# Patient Record
Sex: Male | Born: 1955 | Race: White | Hispanic: No | Marital: Married | Smoking: Former smoker
Health system: Southern US, Community
[De-identification: ages and names within clinical notes are randomized; demographics above are authoritative.]

## PROBLEM LIST (undated history)

## (undated) DIAGNOSIS — K219 Gastro-esophageal reflux disease without esophagitis: Secondary | ICD-10-CM

## (undated) DIAGNOSIS — L4050 Arthropathic psoriasis, unspecified: Secondary | ICD-10-CM

## (undated) DIAGNOSIS — M1990 Unspecified osteoarthritis, unspecified site: Secondary | ICD-10-CM

## (undated) DIAGNOSIS — F1920 Other psychoactive substance dependence, uncomplicated: Secondary | ICD-10-CM

## (undated) DIAGNOSIS — M169 Osteoarthritis of hip, unspecified: Secondary | ICD-10-CM

## (undated) DIAGNOSIS — J302 Other seasonal allergic rhinitis: Secondary | ICD-10-CM

## (undated) HISTORY — PX: TOTAL HIP ARTHROPLASTY: SHX124

## (undated) HISTORY — PX: ORIF TIBIA FRACTURE: SHX5416

## (undated) HISTORY — PX: KNEE SURGERY: SHX244

---

## 1998-08-19 ENCOUNTER — Encounter: Admission: RE | Admit: 1998-08-19 | Discharge: 1998-10-10 | Payer: Self-pay | Admitting: Orthopaedic Surgery

## 1999-02-24 HISTORY — PX: TOTAL HIP ARTHROPLASTY: SHX124

## 1999-08-07 ENCOUNTER — Encounter: Payer: Self-pay | Admitting: Orthopaedic Surgery

## 1999-08-12 ENCOUNTER — Inpatient Hospital Stay (HOSPITAL_COMMUNITY): Admission: RE | Admit: 1999-08-12 | Discharge: 1999-08-17 | Payer: Self-pay | Admitting: Orthopaedic Surgery

## 1999-08-12 ENCOUNTER — Encounter: Payer: Self-pay | Admitting: Orthopaedic Surgery

## 2000-08-11 ENCOUNTER — Encounter: Payer: Self-pay | Admitting: Orthopaedic Surgery

## 2000-08-17 ENCOUNTER — Inpatient Hospital Stay (HOSPITAL_COMMUNITY): Admission: AD | Admit: 2000-08-17 | Discharge: 2000-08-21 | Payer: Self-pay | Admitting: Orthopaedic Surgery

## 2000-09-21 ENCOUNTER — Encounter: Admission: RE | Admit: 2000-09-21 | Discharge: 2000-12-20 | Payer: Self-pay | Admitting: Orthopaedic Surgery

## 2000-12-21 ENCOUNTER — Encounter: Admission: RE | Admit: 2000-12-21 | Discharge: 2001-03-21 | Payer: Self-pay | Admitting: Orthopaedic Surgery

## 2001-03-22 ENCOUNTER — Encounter: Admission: RE | Admit: 2001-03-22 | Discharge: 2001-05-30 | Payer: Self-pay | Admitting: Orthopaedic Surgery

## 2002-02-23 ENCOUNTER — Emergency Department (HOSPITAL_COMMUNITY): Admission: EM | Admit: 2002-02-23 | Discharge: 2002-02-23 | Payer: Self-pay | Admitting: Emergency Medicine

## 2002-08-22 ENCOUNTER — Encounter: Admission: RE | Admit: 2002-08-22 | Discharge: 2002-08-22 | Payer: Self-pay | Admitting: Rheumatology

## 2002-08-22 ENCOUNTER — Encounter: Payer: Self-pay | Admitting: Rheumatology

## 2002-09-01 ENCOUNTER — Ambulatory Visit (HOSPITAL_BASED_OUTPATIENT_CLINIC_OR_DEPARTMENT_OTHER): Admission: RE | Admit: 2002-09-01 | Discharge: 2002-09-01 | Payer: Self-pay | Admitting: General Surgery

## 2006-02-23 HISTORY — PX: ORIF TIBIA FRACTURE: SHX5416

## 2006-12-10 ENCOUNTER — Inpatient Hospital Stay (HOSPITAL_COMMUNITY): Admission: EM | Admit: 2006-12-10 | Discharge: 2006-12-25 | Payer: Self-pay | Admitting: Emergency Medicine

## 2006-12-14 ENCOUNTER — Ambulatory Visit: Payer: Self-pay | Admitting: Vascular Surgery

## 2006-12-15 ENCOUNTER — Encounter (INDEPENDENT_AMBULATORY_CARE_PROVIDER_SITE_OTHER): Payer: Self-pay | Admitting: Orthopedic Surgery

## 2010-07-08 NOTE — Op Note (Signed)
NAME:  Roy [name], Roy [name] NO.:  [id number]   MEDICAL RECORD NO.:  [id number]          PATIENT TYPE:  INP   LOCATION:  5040                         FACILITY:  [facility]   PHYSICIAN:  Doralee Albino. Carola Frost, M.D. DATE OF BIRTH:  1955/08/17   DATE OF PROCEDURE:  12/13/2006  DATE OF DISCHARGE:                               OPERATIVE REPORT   PREOPERATIVE DIAGNOSIS:  Left bicondylar tibial plateau fracture  Schatzker VI.   POSTOPERATIVE DIAGNOSIS:  Left bicondylar tibial plateau fracture  Schatzker VI.   PROCEDURES:  1. Manipulation and reduction of left bicondylar tibial plateau and      eminence fractures.  2. Application of spanning external fixator.   SURGEON:  Doralee Albino. Carola Frost, M.D.   ASSISTANT:  Mearl Latin, [facility].   ANESTHESIA:  General.   SPECIMENS:  None.   ESTIMATED BLOOD LOSS:  Minimal.   COMPLICATIONS:  None.   DRAINS:  None.   DISPOSITION:  To PACU.   CONDITION:  Stable.   BRIEF SUMMARY OF INDICATION FOR PROCEDURE:  Roy [name] is a 55-year-  old male who sustained a low velocity but nonetheless high-energy  pattern left bicondylar tibial plateau fracture.  We discussed  preoperatively the risks and benefits of spanning external fixation as  well as the indication for same given the significant soft tissue  swelling and fracture blisters.  He did not have any preoperative  paresthesias.  He understood that his risks include infection, nerve  injury, vessel injury, need for further surgery, pin tract infection and  others.   DESCRIPTION OF PROCEDURE:  Roy [name] was taken to the operating room  where general anesthesia was induced.  Left lower extremity was prepped  in the usual sterile fashion.  A large clamp and two pins was placed in  the distal tibia and in the mid shaft of the femur to avoid the synovial  reflection of the knee joint.  These were checked for appropriate length  on lateral fluoro images.  We then manipulated the fracture by  pulling  traction just in the alignment and applying medial directed force to the  lateral tibial plateau.  We were unable to get it completely seated  under the lateral femoral condyle but did significantly improve his  position.  Sterile gently compressive dressing was applied around the  pins and then Ace wraps from foot to thigh after first putting Adaptic  and Kerlix over the fracture blisters.  The patient was awakened from  anesthesia and transported to PACU in stable condition.   Of note, we did identify some areas of irritation along the buttock  crease at the proximal end as well as some irritation of the buttocks in  the pressure areas bilaterally.  We contacted wound service  intraoperatively who stated they would see the patient in the a.m.   PROGNOSIS:  Roy [name] will need to return to the OR as soft tissues  allow for definitive internal fixation of his fracture.  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  What ever  fixation is placed, we need to have sufficient stability to allow for  immediate range of motion.  The immediate range of motion of course will  increase his risk of bleeding complications including superficial and  deep infection.  We will obtain a DVT study now and he will begin  Lovenox for DVT prophylaxis and also will ask the occupational therapist  to apply a foot plate to keep the patient out of equinus.  I have  discussed these fractures with the patient and his wife.  I also  discussed the buttock region, which again will be seen by the wound care  nurses and he will immediately be started on side-to-side positioning  every 2 hours to relieve this pressure area.      Doralee Albino. Carola Frost, M.D.  Electronically Signed     MHH/MEDQ  D:  12/13/2006  T:  12/14/2006  Job:   [id number]

## 2010-07-08 NOTE — Op Note (Signed)
NAME:  Roy [name], Roy [name] NO.:  [id number]   MEDICAL RECORD NO.:  [id number]          PATIENT TYPE:  INP   LOCATION:  5040                         FACILITY:  [facility]   PHYSICIAN:  Doralee Albino. Carola Frost, M.D. DATE OF BIRTH:  May 22, 1955   DATE OF PROCEDURE:  12/21/2006  DATE OF DISCHARGE:                               OPERATIVE REPORT   PREOPERATIVE DIAGNOSES:  1. Left bicondylar tibial plateau fracture.  2. Tibial eminence fracture.  3. Infected proximal femoral pin site.  4. Retained external fixator left lower extremity.   POSTOPERATIVE DIAGNOSES:  1. Left bicondylar tibial plateau fracture.  2. Tibial eminence fracture.  3. Infected proximal femoral pin site.  4. Retained external fixator left lower extremity.  5. Lateral meniscus tear.   PROCEDURES:  1. Removal of external fixator under anesthesia.  2. Debridement and curettage of left femur.  3. ORIF of bicondylar tibial plateau fracture.  4. Open treatment of tibial eminence fracture.  5. Repair of lateral mensicus through an arthrotomy.   SURGEON:  Myrene Galas, M.D.   ASSISTANT:  Mearl Latin, [place]   ANESTHESIA:  General, Dr. Gelene Mink.   COMPLICATIONS:  None.   ESTIMATED BLOOD LOSS:  300 mL.   DRAINS:  One.   DISPOSITION:  To PACU.   CONDITION:  Stable.   SPECIMENS:  Two anaerobic and aerobic cultures sent from the  subcutaneous pocket off the left quad pin site.   BRIEF SUMMARY AND INDICATIONS FOR PROCEDURE:  [name] is a 55-  year-old male with psoriatic arthritis who takes Enbrel and methotrexate  who sustained a severe intra-articular bicondylar tibial plateau  fracture that required spanning external fixation and reduction.  He  subsequently was able to be taken to the OR for definitive  reconstruction given the appearance of the soft tissues about his knee.  We did changes his dressing 2 days preoperatively and at that time his  pin site was noted to be stable proximally and  distally.  At that time  we discussed with the patient risks and benefits of surgery including  the possibility of infection, nerve injury, vessel injuries, skin and  wound problems, particularly given his immunosuppressants, decreased  range of motion, arthritis, need for further surgery including total  knee arthroplasty as well as other systemic complications.  After full  discussion, he wished to proceed.   BRIEF DESCRIPTION OF PROCEDURE:  Mr. Damron was given preoperative  antibiotics.  After removal of his dressings it was readily apparent  that he did in fact have an obvious pin site infection of his most  proximal femoral pin.  The fixator was removed.  We did not encounter  any frank purulence, but there was at least 3 cm of surrounding  induration, erythema, and prominent swelling.  After a thorough  scrubbing, we then performed a standard prep and drape.  We used  curettes to debride this area down into the femur and also into a  subcutaneous pocket.  We did obtain cultures which were sent for stat  aerobic and anaerobic evaluation and would eventually produce positive  results for  Gram-positive cocci.  These pin sites were isolated from the  field, covered securely and then Ioban placed.  After a new drape we  then proceeded with medial approach to the proximal tibia, we identified  the saphenous nerve branch as well as the pes tendons and placed a  Penrose around them.  These were retracted while we continued our  dissection posteriorly.  We were able to obtain entry into the fracture  site, placed 2 K-wires proximally in the subchondral bone and used  distraction, but we were unable to completely reduce it.  We then  contoured a 3/5 recon plate, securing it proximally and then placing a  screw distally outside of the plate that we had placed a lamina spreader  against and then used this force to properly elevate and reduce the  medial fragment.  After we fixed this block at  the appropriate length,  we placed a single screw into the shaft.  Next, we performed a classic  AO incision and carried down to the retinaculum which was incised and  elevated.  We then incised the coronoid ligament, passing Prolene  sutures into the meniscus and retracting it off the floor of the tibial  plateau.  There was a tear in the anterior horn at the red white  junction essentially resulting in separation of the meniscus at the  capsule.  This was a longitudinal tear from about 12 o'clock to near 9  o'clock.  This was repaired with Prolene suture using vertical mattress  technique.  There was also another small tear on the inside of the  lateral meniscus and this was debrided sharply with the knife, but did  not involve much meniscus, and we were able to preserve the vast  majority of it without question, and again after repair it had a stable  anterior edge as well.   We then elevated the depressed articular segments anteriorly and  posteriorly, laterally and medially, and then brought over the lateral  plateau and used the [employer] clamps to obtain compression.  We then  picked the appropriate place for the lateral plate, secured it into  position, and compressed across it with standard screws and followed by  locked screws, later exchanging the standard screw as well.  We then  placed fixation into the shaft metaphysis including the kick stand  screws up into the medial plateau.  We irrigated the wounds thoroughly,  checked our alignment and hardware position carefully, and then I used a  drill to create a hole into the metaphyseal defect which was filled with  10 mL of Norian cement.  We also placed graft obtained from the  metaphysis on initial evacuation of the hematoma in along the  posterolateral fragment at the metadiaphysis.  We did place a deep drain  in the anterior compartment prior to closure and performed a standard  layered closure using 0 Vicryl, 2-0 Vicryl  and 3-0 nylon for the skin,  given the patient's again fragile soft tissues.  Sterile gently  compressive dressing was applied.  The patient was awakened from  anesthesia and transported to the PACU in stable condition.   PROGNOSIS:  Mr. Marcott will be non-weightbearing, he will be on Lovenox  for DVT prophylaxis initially, and we will discuss possible Coumadin  long-term.  We did give him an additional gram of vancomycin when the  cultures returned positive, and he will be on broad-spectrum coverage  with vanc until the definitive organism is isolated.  This is remote  from his fixation and certainly poses a risk but we are hopeful will  remain controlled with speedy resolution and until such time as he can  safely be converted to oral medications.  We anticipate slow resumption  of his immunosuppressants at least 2 weeks after surgery so his wounds  have adequate time to heal.  We did place nylon sutures to allow for  longer then usual wait time prior to removal of that also.  He will have  unrestricted range of motion of the knee both actively and passively.  Again because of his immunosuppressant he remains at elevated  risk for perioperative complications, most concerning of which is  infection, again increased by his pin tract infection as well.  Given  his anklylosis of the right elbow and contracture of the right knee, he  is at extremely high risk for contracture, as well.      Doralee Albino. Carola Frost, M.D.  Electronically Signed     MHH/MEDQ  D:  12/21/2006  T:  12/22/2006  Job:  [id number]

## 2010-07-08 NOTE — Discharge Summary (Signed)
NAME:  Roy [name], Roy [name] NO.:  [id number]   MEDICAL RECORD NO.:  [id number]          PATIENT TYPE:  INP   LOCATION:  5040                         FACILITY:  [facility]   PHYSICIAN:  Mearl Latin, PA       DATE OF BIRTH:  1955/07/07   DATE OF ADMISSION:  12/10/2006  DATE OF DISCHARGE:                               DISCHARGE SUMMARY   DISCHARGE DIAGNOSES:  1. Left bicondylar tibial plateau fracture.  2. Tibial eminence fracture.  3. Unprotected proximal femoral pin site.   ADDITIONAL DIAGNOSES:  1. Psoriatic arthritis.  2. Status post right total knee arthroplasty.  3. Status post right total hip arthroplasty.  4. Status post left elbow fusion.   PROCEDURES PERFORMED:  1. Removal of external fixator under anesthesia.  2. Debridement and curettage of left femur.  3. Open reduction and internal fixation of bicondylar tibial plateau      fracture.  4. Open treatment of tibial eminence fracture.   BRIEF HISTORY AND HOSPITAL COURSE:  Roy [name] is a 55 year old male  with history of psoriatic arthritis for 20 years who presented on  December 10, 2006 with complaints of left lower extremity pain.  The  patient states that he was at home and fell down the stairs while  attending to his dog.  He had immediate onset of pain and deformity of  the left knee.  Initially, he seen and evaluated by Dr. Thurston Hole who  treated with ice, elevation with closed reduction the next day using  intraarticular injection to improve alignment.  After closed reduction,  patient denied any numbness, tingling distally but had significant soft  tissue swelling.  The patient was seen on December 13, 2006 by Dr. Carola Frost  for further consultation and to establish more definitive care with  regards to left tibial plateau fracture which is a Schatzker VI.  The  patient was taken to the OR on December 13, 2006 for application of  expanding external fixator on the left side in addition to manipulation  and  reduction of the left bicondylar tibial plateau fracture and  eminence fractures.  Initial treatment with expanding external fixator  was decided upon secondary to significant soft tissue swelling and  fracture blisters.  After application of the external fixator, patient  denied any numbness or tingling and any weakness in the left distal  extremity.  The patient remained in the hospital for 7 days after  application of the external fixator to permit soft tissue healing.  In  addition, given patient's history of psoriatic arthritis with total knee  arthroplasty on the right knee and total hip arthroplasty on the right  side as well, it was thought to be a more prudent decision to keep the  patient in the hospital to allow continuous care and permit mobilization  with physical therapy.  On October 28, the patient was brought to the  operating room once again for definitive correction of his left tibial  plateau fracture.  At that time, open reduction and internal fixation of  the tibial plateau fracture was pursued.  Since that time,  patient has  been complaining of pain but there have been substantial decreases in  pain.  Patient denies any numbness or tingling in the left distal lower  extremity, also denies any significant weakness.  Given the patient's  current status with limited mobility of the left lower extremity in  addition to limited roll of PT/OT at this time, it is deemed appropriate  to discharge home.  The patient will be discharged to his mother's  house, which he has stated that this would be an appropriate place for  him to be during his recovery period.  During the entire hospital stay,  Roy [name] did not suffer any acute complications.   LABORATORY:  Sodium 137, potassium 3.6, chloride 100, CO2 30, glucose  91, BUN 6, creatinine 0.64, calcium of 8.2.  CBC:  WBC 7.2, hemoglobin  9.9, hematocrit 29.4, platelets of 310.  Final wound cultures  demonstrate moderate  Staphylococcus aureus.  In addition, cultures also  demonstrate no acid fast bacilli and preliminary anaerobe cultures  demonstrate normal anaerobes.   Radiographs, left tibia and fibula 4-view show adequate screw and plate  fixation with grossly anatomic reduction and alignment of the major  fragments.   DISCHARGE MEDICATIONS:  1. Robaxin 500 mg one tablet by mouth every 6 hours p.r.n.  2. Duricef 500 mg one by mouth every 12 hours until complete, given a      7-day course.  3. Lovenox 40 mg one dose subcutaneous daily x20 days.  4. Percocet 5/325 one to two by mouth every 6 hours p.r.n. for pain.  5. Oxycodone 5 mg one to two every 3 hours in between Percocet for      breakthrough pain.   The patient may resume his home meds as follows:  1. Folic acid 5 mg daily.  2. Aspirin as needed.   Patient will hold methotrexate and Enbrel until his follow-up visit.  The patient was instructed to use over the counter stool softeners as  needed.   DISCHARGE INSTRUCTIONS:  Roy [name] will remain nonweightbearing on his  left lower extremity.  He will be discharged to home with home health PT  from [place].  He is encouraged to engage in knee and ankle range of  motion of his left lower extremity.  He is also to be out of bed to  chair as tolerated and refrain from staying in bed for extended periods  of time in order to prevent any skin breakdown and necrosis.  With  regards to DVT prophylaxis, Roy [name] will be on Lovenox 40 mg  subcutaneous for 20 days.   Given the patient's history of psoriatic arthritis with right sided  total knee and total hip arthroplasties in addition to elbow fusion on  the  right side.  It is imperative that he engage in physical therapy to  promote maintenance of range of motion.  For this, Roy [name] will also  be provided with a CPM machine which will enable him to engage in knee  range of motion on knee, left side.  The importance of maintaining range  of  motion of his left knee was stressed to the patient for prevention of  contracture and possible return to the OR.  Patient was also provided  with an instruction sheet on wound care, which should be reviewed.  Mr.  Netherton will follow up with Dr. Carola Frost in 10 to 14 days.  At this time, AP  and lateral views of his left knee will  be obtained to assess hardware  placement and maintenance of fracture reduction.      Mearl Latin, PA     KWP/MEDQ  D:  12/24/2006  T:  12/25/2006  Job:  [id number]

## 2010-07-08 NOTE — Consult Note (Signed)
NAME:  Roy [name], MCBREEN NO.:  [id number]   MEDICAL RECORD NO.:  [id number]          PATIENT TYPE:  INP   LOCATION:  5040                         FACILITY:  [facility]   PHYSICIAN:  Doralee Albino. Carola Frost, M.D. DATE OF BIRTH:  Oct 27, 1955   DATE OF CONSULTATION:  12/13/2006  DATE OF DISCHARGE:                                 CONSULTATION   REQUESTING PHYSICIAN:  Elana Alm. Thurston Hole, M.D.   REASON FOR CONSULTATION REQUEST:  Severe left tibial plateau fracture.   BRIEF HISTORY ON PRESENTATION:  Mr. Berkley is a very pleasant 55-year-  old male who fell down stairs in his home while attending to his dog.  He sustained immediate onset of pain and deformity of his left knee.  He  was initially seen and evaluated by Dr. Thurston Hole who treated with ice,  elevation, followed the following day by a closed reduction using an  intra-articular injection in order to improve his alignment.  Postreduction films did show improvement of his alignment; however, he  continued to have lateral displacement of the lateral aspect of the  tibial plateau. CT scan was obtained.  The patient denies tingling or  numbness distally but did have associated soft tissue swelling.  At this  time, he denies any complaints other than left knee pain.   PAST MEDICAL HISTORY:  1. Psoriatic arthritis for 20 years status post right total knee      arthroplasty.  2. Status post right total hip.  3. Status post left elbow effusion.   FAMILY MEDICAL HISTORY:  Notable for psoriatic arthritis.  Father with  prior total joint replacement, mom with skin disease.   SOCIAL HISTORY:  Married.  He is a Engineer, site. Does not smoke, drink  or use any drugs.  He is married.   ALLERGIES:  No drug allergies.  MORPHINE does call some nausea.   CURRENT MEDICATIONS:  1. Methotrexate 2.5 mg 8 tablets on Saturday.  2. Imdur.  3. Folic acid 5 mg a day.   REVIEW OF SYSTEMS:  Notable for reading glasses. No history of shortness  of  breath, chest pain, nausea, vomiting, fever or malaise.   PHYSICAL EXAMINATION:  GENERAL:  Mr. Barrales is seated comfortably.  He  is on 2 liters of oxygen.  VITAL SIGNS:  Heart has a regular rate.  EXTREMITIES:  His left leg has significant swelling about the knee,  ankle and foot with 2+ pitting edema.  He has intact deep peroneal,  superficial peroneal and tibial nerve sensory functions, able to  demonstrate EHF, EHL as well as ankle flexion, extension, and eversion  without difficulty.  Dorsalis pedis, posterior tibial pulses are each  2+.  Skin does not wrinkle in anticipated area of incision about the  knee. Does have a significant tenderness.   ASSESSMENT:  Comminuted left tibial plateau fracture, Schatzker VI.   PLAN:  Mr. Flamenco will require DVT prophylaxis which needs to be started  as soon as possible with Lovenox until the time of definitive  reconstruction.  Given his significant swelling, he is not a candidate  for definitive care at  this time but will undergo closed reduction and  application of expanding frame to allow soft tissues to quiet down.  I  have discussed with him the risks and benefits of this procedure  including the need for further surgery.  Will also obtain a foot plate  for this device to assist with maintaining his foot in the proper amount  of the extension.  Given the magnitude of injury as discussed and has  been requested by Dr. Thurston Hole, I will assume care of the patient.      Doralee Albino. Carola Frost, M.D.  Electronically Signed     MHH/MEDQ  D:  12/13/2006  T:  12/13/2006  Job:  [id number]
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
## 2010-07-11 NOTE — Discharge Summary (Signed)
[place]. [facility]  Patient:    Roy [name], Roy [name]                     MRN: [id number] Adm. Date:  [id number] Disc. Date: [id number] Attending:  Randolm Idol Dictator:   Jamelle Rushing, P.A.                           Discharge Summary  ADMISSION DIAGNOSES: 1. Psoriatic arthritis, right hip. 2. Psoriasis.  DISCHARGE DIAGNOSES: 1. Status post right total hip arthroplasty. 2. Psoriasis.  HISTORY OF PRESENT ILLNESS:  The patient is a 55 year old white male with a long history of psoriatic arthritis presenting with a six month history of progressively worsening right hip pain.  The pain is constant, it is in the right groin with no radiation.  The pain is described as very sharp, and increases with bad weather and prolonged standing.  Pain decreases with rest, Tylenol, and Celebrex.  There is popping with range of motion, and the patient does use crutches for ambulation for the last three months.  The patient denies any night pain, paresthesias, edema, or injury.  ALLERGIES:  No known drug allergies.  CURRENT MEDICATIONS: 1. Extra Strength Tylenol 500 mg two tablets p.o. q.i.d. 2. Celebrex 100 mg p.o. b.i.d. 3. Glucosamine chondroitin 3 tablets p.o. q.d.  SURGICAL PROCEDURE:  On August 12, 1999, the patient was taken to the OR by Dr. [name], with assistance from Dr. Lenard Galloway. Mortenson and Arnoldo Morale, P.A.  Under general anesthesia the patient had a right total hip replacement performed without any complications.  The patient was returned to the recovery room in satisfactory condition.  CONSULTS:  On August 12, 1999, the following routine consults were requested: Pharmacy for Coumadin DVT prophylaxis, physical therapy, occupational therapy, rehab, care management.  HOSPITAL COURSE:  On August 12, 1999, the patient was admitted to [facility] under the care of Dr. [name].  The patient was taken to the OR where a right  total hip arthroplasty was performed.  The patient tolerated the procedure well, and was transferred to the recovery room in good condition.  The patient then had a five day postoperative course which was pretty much unremarkable.  The patients vital signs remained stable, he remained afebrile.  His H&H remained stable, and his chemistries also remained stable.  The patients right hip incision was well approximated with staples. There was no sign of erythema or any active purulent discharge.  The patients leg remained neuromotor vascularly intact without a Homans sign.  The patient progressed nicely with physical therapy which enabled the patient to go home on postop day #5.  DISCHARGE MEDICATIONS: 1. The patient is to resume all pre-hospital meds. 2. OxyContin 10 mg one tablet q.12h. 3. Oxy IR 5 mg two tablets q.4-6h. if needed. 4. Coumadin 2.5 mg Sunday, Monday, Tuesday, and then pharmacy will call for    the next dose.  ACTIVITY:  The patient is to remain 50% weightbearing right leg with the use of a walker.  WOUND CARE:  The patient is to check wound daily for infection, any increased redness, temperature, discoloration, swelling, or pain.  If present call orthopedic surgeon.  FOLLOWUP:  The patient is to call [phone] for a follow-up appointment with Dr. Cleophas Dunker in 10 to 14 days.  LABORATORY DATA:  EKG on admission was normal sinus rhythm at 88 beats per minute.  Chest x-ray showed no active acute pulmonary disease.  CBC on admission was 8.2, hemoglobin 14.1, hematocrit 41.4, platelets of 330. On August 15, 1999, WBCs were 7.8, hemoglobin was 9.6, hematocrit of 26.9, with platelets of 301.  Coagulation PTT on August 17, 1999, was 16.9 with a 1.7 INR.  Routine chemistries on August 17, 1999, were all within normal limits.  DISCHARGE MEDICATIONS: 1. Vioxx 25 mg p.o. q.d. 2. OxyContin CR 10 mg p.o. q.12h. 3. Senokot one tablet p.o. b.i.d. before meals. 4. Docusate 100 mg  p.o. b.i.d. 5. Coumadin 2.5 mg per pharmacy dosing. 6. Oxy IR 5 mg tablets one or two tablets q.4-6h. p.r.n. breakthrough pain.  CONDITION ON DISCHARGE:  Improved and good. DD:  10/29/99 TD:  10/29/99 Job: [id number]

## 2010-07-11 NOTE — Op Note (Signed)
[place]. [facility]  Patient:    Roy [name], Roy [name]                     MRN: [id number] Proc. Date: 08/17/00 Adm. Date:  [id number] Attending:  Randolm Idol                           Operative Report  PREOPERATIVE DIAGNOSIS:  End-stage psoriatic arthritis, right knee.  POSTOPERATIVE DIAGNOSIS:  End-stage psoriatic arthritis, right knee.  OPERATION PERFORMED:  Right total knee replacement.  SURGEON:  Claude Manges. Cleophas Dunker, M.D.  ASSISTANT:  Jamelle Rushing, P.A.  ANESTHESIA:  General orotracheal.  COMPLICATIONS:  None.  COMPONENTS:  Depuy LCS complete primary femoral cemented standard femoral component, a #3 rotated tibial tray with a 12.5 mm bridging bearing and the cruciate design metal back rotating patella, all cured with polymethyl methacrylate.  DESCRIPTION OF PROCEDURE:  With the patient comfortable on the operating table and under general orotracheal anesthesia, the right lower extremity was placed in a thigh tourniquet.  The leg was then prepped with Betadine scrub and then DuraPrep from the tourniquet to the midfoot.  Sterile draping was performed. With the extremity still elevated, it was Esmarch exsanguinated with a proximal tourniquet at 350 mmHg.  The patient has had three previous arthroscopies of his right knee with manipulation.  He was only able to obtain approximately 88 to 90 degrees of flexion and lacked probably 10 degrees or less to full extension and without evidence of instability.  A midline longitudinal incision was then made centered over the patella via sharp dissection carried down to subcutaneous tissue.  The first layer of capsule was incised in the midline, a formal medial parapatellar incision was made with a Bovie through the deep capsule extending from the superior pouch to the tibial tubercle.  The synovial tissue and soft tissue was hypertrophic, thus creating scar within the gutters so that he could not  attain normal flexion.  The complete synovectomy was performed at which point we were able to release medial and lateral scar tissue and he was able to flex probably 115 degrees at that point.  The patella was everted 180 degrees and the knee flexed.  There was considerable synovitis, further synovectomy was performed.  There were large osteophytes along the medial and lateral femoral condyle with loss of articular cartilage more medially than laterally and a pannus of synovial tissue along the articular cartilage.  We templated a standard plus femoral component and a #3 tibial tray.  The appropriate femoral and tibial jigs were then applied to make the appropriate femoral and tibial bone cuts.  A 4 degree distal femoral cut was made.  ACL and PCL were sacrificed.  MCL and LCL remained intact.  We had perfectly symmetrical flexion and extension gaps at 10 mm.  Lamina spreaders were inserted to remove remnants of ACL and PCL and medial and lateral menisci posteriorly within each of the compartments.  There was quite a bit of synovial tissue posteriorly which was released and we also removed osteophytes along the posterior femoral condyle.  The trial femoral and tibial components were then inserted.  With flexion extension, there was quite a bit of lift off of the femur in flexion  where there was probably a 3 or 4 mm gap superiorly and anteriorly.  We had some difficulty inserting the components.  We felt that the cause was a very soft  bond.  Accordingly, we then trialed a standard, i.e. one size down femoral component and used a 12.5 mm bridging bearing.  This construct was much more stable.  It did not open up to varus or valgus stress and there was no lift off of the femur in any direction.  The patella was then prepared by removing approximately 9 to 9.5 mm of bone leaving about 12.5 mm of patella.  The cruciate jig was then applied and the bone cut was made to accept the patella.   We trialed the patella with appropriate fit and it would not sublux laterally during flexion and extension.  All of the components were removed.  We copiously irrigated the joint with jet saline and antibiotic solution.  The retractors were inserted and the #3 rotating tibial platform was then cemented in place.  Extraneous methacrylate was removed.  The rotating 12.5 mm bridging bearing was then applied.  The standard femoral component was then cemented in place and extraneous methacrylate was removed.  He had an excellent fit.  The knee was placed in extension.  The patella was then applied with a patella clamp and again extraneous methacrylate was removed.  Maintaining the knee in extension, the methacrylate matured.  As we placed the knee through a full range of motion, there were a few areas of hardened methacrylate in the intercondylar notch which we removed with an osteotome.  Even along the lateral femoral condyle which was also removed with an osteotome.  The joint was again irrigated with saline solution, antibiotic solution.  It was placed through a full range of motion.  We had slight hyperextension, flexion to well over 115 to 20 degrees without instability and the components fit very nicely.  The tourniquet was deflated.  Bleeders were Bovie coagulated.  Hemovac was inserted.  Deep capsule closed with interrupted  #1 Tycron.  Superficial layer of capsule closed with a running #0 Vicryl.  Subcutaneous tissues closed with a running 2-0 Vicryl.  Skin closed with skin clips.  Sterile bulky dressing was applied followed by an Ace bandage and knee immobilizer.  There was good capillary refill of the toes, good pulses.  The patient was to receive femoral nerve block postoperatively for pain control. DD:  08/17/00 TD:  08/17/00 Job: [id number]

## 2010-07-11 NOTE — Op Note (Signed)
   NAME:  Roy [name], Roy [name]                        ACCOUNT NO.:  [id number]   MEDICAL RECORD NO.:  [id number]                   PATIENT TYPE:  AMB   LOCATION:  DSC                                  FACILITY:  [facility]   PHYSICIAN:  Jimmye Norman III, M.D.               DATE OF BIRTH:  1955-09-03   DATE OF PROCEDURE:  09/01/2002  DATE OF DISCHARGE:                                 OPERATIVE REPORT   PREOPERATIVE DIAGNOSIS:  Perianal mass in the approximate 7 o'clock position  with 12 o'clock being directly posterior.   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   PROCEDURES:  1. Examination under anesthesia.  2. Fistulotomy.   SURGEON:  Jimmye Norman, M.D.   ANESTHESIA:  General endotracheal.   ESTIMATED BLOOD LOSS:  Less than 30 mL.   COMPLICATIONS:  None.   CONDITION:  Stable.   FINDINGS:  The patient had a subcutaneous abscess in the 7 o'clock position  associated with a fistula in ano, which drained to a sort of almost a  midline anterior sinus tract.   OPERATION:  The patient was taken to the operating room, placed on the table  initially in a supine position.  After an adequate endotracheal anesthetic  was administered, he was flipped into the jackknife prone position for the  procedure.  He was prepped with Betadine.   Upon anoscopic examination, a fistulous tract could be seen just above the  dentate line almost directly anterior.  We were able to pass a probe through  the abscess cavity up into the fistulous tract and demonstrate very clearly  that this was a fistula in ano.  The tract itself extended approximately 5  cm long.  We opened the tract along the probe using a #15 blade and then  used cautery to attain hemostasis.  Rongeurs were used to scrape out the  scar tissue in the fistulous tract and in the cavity along with Metzenbaum  scissors and pickups to cut out most of the scar tissue.  There was no  infected tissue left.  There was no isolated  mass associated with this  fistula.  We attained hemostasis, placed some Gelfoam in the open tract, and  then packed the anus area with a Dibucaine-soaked Gelfoam.  We also injected  into the perifistulous area with half-strength Marcaine.  The patient was  taken to the recovery room with sterile dressing.                                              Kathrin Ruddy, M.D.   JW/MEDQ  D:  09/01/2002  T:  09/02/2002  Job:  [id number]

## 2010-07-11 NOTE — Discharge Summary (Signed)
[place]. [facility]  Patient:    Roy [name], Roy [name]                     MRN: [id number] Adm. Date:  [id number] Disc. Date: [id number] Attending:  Randolm Idol Dictator:   Arnoldo Morale, P.A.-C. CC:         Georg Ruddle. Viviann Spare, M.D.   Discharge Summary  ADMISSION DIAGNOSES: 1. 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 2. Psoriasis. 3. History of alcohol and drug abuse.  DISCHARGE DIAGNOSES: 1. 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 2. Psoriasis. 3. History of alcohol and drug abuse. 4. Post hemorrhagic anemia.  SURGICAL PROCEDURE:  On August 17, 2000, Roy [name] underwent a right total knee arthroplasty by Dr. Claude Manges. [place] assisted by Oneida Alar, P.A.-C.  COMPLICATIONS:  None.  CONSULTS: 1. Pharmacy consult for Coumadin therapy August 17, 2000. 2. Physical therapy and rehab medicine and pastoral care consult on September 17, 2000. 3. Occupational therapy consult August 19, 2000. 4. Case management consult August 20, 2000.  HISTORY OF PRESENT ILLNESS:  This 55 year old white male patient presents to Dr. Cleophas Dunker with a significant history of psoriatic arthritis for many years.  He had a right hip replacement on August 12, 1999 and has done well but has been having intermittent right knee pain for the last seven years.  The pain was gradual in onset with no known injury and arthroscopies in the past have provided only minimal relief of his pain.  The pain is an intermittent ache located underneath the kneecap without radiation.  It increases with cold, wet weather and decreases with workout, use of ice, or Tylenol or Celebrex.  Knee pops at times and it does swell.  He is able to ambulate without any assistive devices but the pain limits what he is able to do and that is why he is presenting for a right total knee replacement.  HOSPITAL COURSE:  Roy [name] tolerated  his surgical procedure well and without immediate postoperative complications.  He was transferred to 5000.  On postop day #1, Tmax was 99.2, vital signs were stable.  Pain controlled with PCA. Right knee dressing intact.  Hemovac was left in another 24 hours due to a large amount of drainage.  He was continued on Ancef.  Hemoglobin was 10.6, hematocrit 30.8.  He was started on PT per protocol.  Postoperative day #2, Tmax 98.4, vital signs stable.  Right knee inicision well-approximated with staples.  Hemovac discontinued.  Negative Homans. Hemoglobin at that time 9.7, hematocrit 28.6.  PT 15.9, INR 1.5.  He was switched to p.o. pain medications and continued on therapy.  He continued to make good progress over the next two days.  He remained afebrile with his vital signs stable.  Hemoglobin remained stable.  Leg remained unchanged.  Finally, on August 12, 2000 he was ready for discharge home.  DISCHARGE INSTRUCTIONS:  He is to resume his prehospitalization medications and diet with the exception of the Celebrex.  ADDITIONAL MEDICATIONS: 1. Percocet 5/325 mg one to two tablets p.o. q.4h. p.r.n. pain. 2. Coumadin 5 mg one tablet p.o. q.d. with the dose to be adjusted per home    health pharmacy.  ACTIVITY:  He is to be out of bed, 50% weightbearing or less on the right leg with use of the walker.  He is to have home health physical  therapy, R.N., and pharmacy to assist him with his rehab.  WOUND CARE:  He is to keep his right knee incision clean and dry with staples intact.  FOLLOW-UP:  He needs to follow up with Dr. Cleophas Dunker on postoperative day #14 or just about that time for staple removal and first postoperative check.  He is to notify Dr. Cleophas Dunker if temperature greater than 101.5, chills, pain unrelieved by pain medications, or foul-smelling drainage from the wound.  DISPOSITION:  He stated good understanding of these instructions and was subsequently discharged  home.  LABORATORY DATA:  On June 26 - hemoglobin 10.6, hematocrit 30.8.  On June 27 - hemoglobin 9.7, hematocrit 28.6.  On June 28 - white count 6.8, hemoglobin 10.8, hct 32, and platelets 267.  On June 26 - PT 13.3, INR 1.1.  On June 29 - PT 18.9, INR 2.0.  On June 26 - glucose 121, calcium 8.2.  On June 27 - calcium 8.1.  All other laboratory studies within normal limits. DD:  09/07/00 TD:  09/07/00 Job: [id number]

## 2010-07-11 NOTE — Op Note (Signed)
[place]. [facility]  Patient:    Roy [name], Roy [name]                     MRN: [id number] Proc. Date: 08/12/99 Adm. Date:  [id number] Attending:  Randolm Idol                           Operative Report  PREOPERATIVE DIAGNOSIS:  End-stage psoriatic arthritis, right hip.  POSTOPERATIVE DIAGNOSIS:  End-stage psoriatic arthritis, right hip.  OPERATION PERFORMED:  Right total hip replacement.  SURGEON:  Claude Manges. Cleophas Dunker, M.D.  ASSISTANT: 1. Lenard Galloway. Chaney Malling, M.D. 2. Arnoldo Morale, P.A.  ANESTHESIA:  General orotracheal.  COMPLICATIONS:  None.  COMPONENTS:  Depuy AML DuraLock 100 series acetabular shell, 52 mm outer diameter with an apex hole eliminator and the [place] 10 degree acetabular polyethylene liner, 28 mm diameter.  A 16.5 diameter hip stem MMA 5/8 pore coated with a 28 mm hip ball and plus 5 mm neck.  DESCRIPTION OF PROCEDURE:  With the patient comfortable on the operating table and under general orotracheal anesthesia, the patient was placed in the lateral decubitus position.  He was then secured to the operating room table with Innomed hip system.  The right hip was then prepped from the iliac crest to below the knee with Betadine scrub and then DuraPrep.  Sterile draping was performed.  A routine Southern incision was utilized and via sharp dissection carried down to subcutaneous tissue.  Small bleeders were Bovie coagulated. Self-retaining retractors were inserted.  The iliotibial band was identified and incised along the length of the skin incision.  Retractors were placed more deeply.  With the hip internally rotated, the short external rotators were identified. The tendinous structures were tagged with 0 Tycron suture and then incised in their attachment to the posterior aspect of the greater trochanter.  The capsule was identified and incised on the femoral neck to the edge of the acetabulum.  There was some bloody joint  effusion with some mild to moderate synovitis. The head was then dislocated in posteriorly without difficulty.  Retractors were then inserted with the AML guide, the femoral neck was then osteotomized. The femoral head was then delivered from the wound revealing little if any articular cartilage remaining was deformed with obvious chondral cysts.  With the hip flexed and the femoral neck delivered into the wound, a starter hole was then made through the piriformis fossa.  Reaming was then performed to 16 mm to accept a 16.5 mm prosthesis.  Rasping was then performed to 16.5 medium modified aspect with an excellent cut on the calcar, minimal calcar reaming was necessary.  Retractors were then placed about the acetabulum.  Soft tissue was removed from around the periphery of the acetabulum.  There were large osteophytes superiorly and posteriorly.  These were removed.  Reaming was then performed to 51 mm to accept a 52 mm prosthesis.  We trialed a 50 and 52 prosthesis.  We had excellent rim fit on both.  The 50 mm prosthesis would completely sit within the acetabulum.  A 52 was proud as expected.  The final 52 mm outer diameter acetabular 100 series DuraLock component was then impacted.  The trial acetabular component was inserted followed by the femoral rasp.  We tried several neck sizes and felt that we had posterior instabilities.  We tried the prodigy and still had better stability but still with posterior dislocation.  We then elected to reposition the acetabular in more reflection.  This was accomplished without difficulty. We then reinserted the trial components and felt that we had excellent stability with that construct in flexion, extension, internal and external rotation.  There was no evidence of subluxation with the 0 mm neck.  Accordingly, the trial components were removed.  The apex hole eliminator was inserted and the final polyethylene 10 degree liner was impacted into  the acetabulum without difficulty after copiously irrigating the wound with saline an then antibiotic solution.  The 16.5 mm medium modified aspect femoral component was then impacted directly on the calcar.  We then tried the 28 mm +0 neck and felt that there was a little bit of toggling with extremes of flexion and internal rotation and adduction that there was some subluxation, so then we trialed a +5 neck which gave [place] approximately 2 more millimeters of leg length.  I felt that the leg lengths were still symmetrical without changing the preoperative status and construct is much more stable and still no subluxation with even extension.  Accordingly, we removed the trial +5 neck and inserted the final +5 neck and 28 mm ball.  The entire construct was reduced.  The hip was placed through a full range of motion again without any evidence of instability and no toggling and again we felt leg lengths were symmetrical.  The wound was again irrigated with saline antibiotic solution.  The capsule was closed anatomically with 0 Tycron.  The short external rotators were reapproximated with Tycron.  The iliotibial band closed with running 0 Vicryl, subcutaneous closed in two layers in 0 and 2-0 Vicryl. The skin was closed with skin clips.  0.25% Marcaine was injected in the wound edges.  Sterile bulky dressing was applied. The patient was then awakened and returned to the post anesthesia recovery room in satisfactory condition. DD:  08/12/99 TD:  08/14/99 Job: [id number]

## 2010-12-03 LAB — CBC
HCT: 29.4 — ABNORMAL LOW
HCT: 32.7 — ABNORMAL LOW
HCT: 35.4 — ABNORMAL LOW
HCT: 39.1
HCT: 39.2
HCT: 39.5
HCT: 42.1
HCT: 45.2
HCT: 48.5
Hemoglobin: 11.1 — ABNORMAL LOW
Hemoglobin: 12.1 — ABNORMAL LOW
Hemoglobin: 13.2
Hemoglobin: 13.6
Hemoglobin: 13.6
Hemoglobin: 14.4
Hemoglobin: 15.5
Hemoglobin: 16.6
Hemoglobin: 9.9 — ABNORMAL LOW
MCHC: 33.8
MCHC: 33.8
MCHC: 33.9
MCHC: 34.1
MCHC: 34.2
MCHC: 34.2
MCHC: 34.3
MCHC: 34.4
MCHC: 34.6
MCV: 83.4
MCV: 84.4
MCV: 84.6
MCV: 84.9
MCV: 85.2
MCV: 85.3
MCV: 85.5
MCV: 85.5
MCV: 85.9
Platelets: 238
Platelets: 250
Platelets: 254
Platelets: 268
Platelets: 268
Platelets: 274
Platelets: 287
Platelets: 307
Platelets: 310
RBC: 3.43 — ABNORMAL LOW
RBC: 3.87 — ABNORMAL LOW
RBC: 4.19 — ABNORMAL LOW
RBC: 4.59
RBC: 4.65
RBC: 4.7
RBC: 4.94
RBC: 5.28
RBC: 5.64
RDW: 12.7
RDW: 12.7
RDW: 12.8
RDW: 12.9
RDW: 12.9
RDW: 13
RDW: 13.1
RDW: 13.3
RDW: 13.5
WBC: 15.1 — ABNORMAL HIGH
WBC: 7.2
WBC: 7.6
WBC: 8.2
WBC: 8.5
WBC: 9.1
WBC: 9.1
WBC: 9.4
WBC: 9.9

## 2010-12-03 LAB — BASIC METABOLIC PANEL
BUN: 11
BUN: 15
BUN: 3 — ABNORMAL LOW
BUN: 4 — ABNORMAL LOW
BUN: 4 — ABNORMAL LOW
BUN: 5 — ABNORMAL LOW
BUN: 6
BUN: 7
BUN: 7
CO2: 24
CO2: 29
CO2: 29
CO2: 30
CO2: 30
CO2: 30
CO2: 31
CO2: 31
CO2: 32
Calcium: 8 — ABNORMAL LOW
Calcium: 8 — ABNORMAL LOW
Calcium: 8.1 — ABNORMAL LOW
Calcium: 8.2 — ABNORMAL LOW
Calcium: 8.6
Calcium: 8.7
Calcium: 8.9
Calcium: 9
Calcium: 9.2
Chloride: 100
Chloride: 102
Chloride: 95 — ABNORMAL LOW
Chloride: 96
Chloride: 96
Chloride: 97
Chloride: 97
Chloride: 98
Chloride: 98
Creatinine, Ser: 0.56
Creatinine, Ser: 0.57
Creatinine, Ser: 0.6
Creatinine, Ser: 0.64
Creatinine, Ser: 0.66
Creatinine, Ser: 0.7
Creatinine, Ser: 0.74
Creatinine, Ser: 0.74
Creatinine, Ser: 0.76
GFR calc Af Amer: >60
GFR calc Af Amer: >60
GFR calc Af Amer: >60
GFR calc Af Amer: >60
GFR calc Af Amer: >60
GFR calc Af Amer: >60
GFR calc Af Amer: >60
GFR calc Af Amer: >60
GFR calc Af Amer: >60
GFR calc non Af Amer: >60
GFR calc non Af Amer: >60
GFR calc non Af Amer: >60
GFR calc non Af Amer: >60
GFR calc non Af Amer: >60
GFR calc non Af Amer: >60
GFR calc non Af Amer: >60
GFR calc non Af Amer: >60
GFR calc non Af Amer: >60
Glucose, Bld: 101 — ABNORMAL HIGH
Glucose, Bld: 103 — ABNORMAL HIGH
Glucose, Bld: 107 — ABNORMAL HIGH
Glucose, Bld: 119 — ABNORMAL HIGH
Glucose, Bld: 120 — ABNORMAL HIGH
Glucose, Bld: 130 — ABNORMAL HIGH
Glucose, Bld: 136 — ABNORMAL HIGH
Glucose, Bld: 91
Glucose, Bld: 98
Potassium: 3.6
Potassium: 3.8
Potassium: 3.9
Potassium: 3.9
Potassium: 3.9
Potassium: 3.9
Potassium: 4.2
Potassium: 4.2
Potassium: 4.5
Sodium: 129 — ABNORMAL LOW
Sodium: 131 — ABNORMAL LOW
Sodium: 134 — ABNORMAL LOW
Sodium: 134 — ABNORMAL LOW
Sodium: 134 — ABNORMAL LOW
Sodium: 135
Sodium: 136
Sodium: 137
Sodium: 137

## 2010-12-03 LAB — TYPE AND SCREEN
ABO/RH(D): B POS
ABO/RH(D): B POS
Antibody Screen: NEGATIVE
Antibody Screen: NEGATIVE

## 2010-12-03 LAB — URINALYSIS, ROUTINE W REFLEX MICROSCOPIC
Bilirubin Urine: NEGATIVE
Glucose, UA: NEGATIVE
Hgb urine dipstick: NEGATIVE
Ketones, ur: 15 — AB
Nitrite: NEGATIVE
Protein, ur: NEGATIVE
Specific Gravity, Urine: 1.025
Urobilinogen, UA: 0.2
pH: 5.5

## 2010-12-03 LAB — DIFFERENTIAL
Basophils Absolute: 0
Basophils Absolute: 0
Basophils Relative: 0
Basophils Relative: 0
Eosinophils Absolute: 0
Eosinophils Absolute: 0.3
Eosinophils Relative: 0
Eosinophils Relative: 3
Lymphocytes Relative: 19
Lymphocytes Relative: 6 — ABNORMAL LOW
Lymphs Abs: 1
Lymphs Abs: 1.7
Monocytes Absolute: 0.7
Monocytes Absolute: 0.9 — ABNORMAL HIGH
Monocytes Relative: 10
Monocytes Relative: 4
Neutro Abs: 13.4 — ABNORMAL HIGH
Neutro Abs: 6.2
Neutrophils Relative %: 68
Neutrophils Relative %: 89 — ABNORMAL HIGH

## 2010-12-03 LAB — URINE CULTURE
Colony Count: 2000
Special Requests: NEGATIVE

## 2010-12-03 LAB — COMPREHENSIVE METABOLIC PANEL
ALT: 45
AST: 33
Albumin: 3.9
Alkaline Phosphatase: 67
BUN: 13
CO2: 26
Calcium: 9.1
Chloride: 103
Creatinine, Ser: 0.76
GFR calc Af Amer: >60
GFR calc non Af Amer: >60
Glucose, Bld: 113 — ABNORMAL HIGH
Potassium: 4.3
Sodium: 137
Total Bilirubin: 0.7
Total Protein: 7.1

## 2010-12-03 LAB — AFB CULTURE WITH SMEAR (NOT AT ARMC): Acid Fast Smear: NONE SEEN

## 2010-12-03 LAB — ANAEROBIC CULTURE

## 2010-12-03 LAB — PROTIME-INR
INR: 1
Prothrombin Time: 13.2

## 2010-12-03 LAB — GRAM STAIN

## 2010-12-03 LAB — WOUND CULTURE

## 2010-12-03 LAB — ABO/RH: ABO/RH(D): B POS

## 2010-12-03 LAB — APTT: aPTT: 25

## 2012-07-03 ENCOUNTER — Emergency Department (HOSPITAL_BASED_OUTPATIENT_CLINIC_OR_DEPARTMENT_OTHER): Payer: BC Managed Care – PPO

## 2012-07-03 ENCOUNTER — Encounter (HOSPITAL_BASED_OUTPATIENT_CLINIC_OR_DEPARTMENT_OTHER): Payer: Self-pay | Admitting: Emergency Medicine

## 2012-07-03 ENCOUNTER — Emergency Department (HOSPITAL_BASED_OUTPATIENT_CLINIC_OR_DEPARTMENT_OTHER)
Admission: EM | Admit: 2012-07-03 | Discharge: 2012-07-04 | Disposition: A | Discharge status: Home / Self Care | Payer: BC Managed Care – PPO | Source: Home / Self Care | Attending: Emergency Medicine | Admitting: Emergency Medicine

## 2012-07-03 DIAGNOSIS — M25561 Pain in right knee: Secondary | ICD-10-CM

## 2012-07-03 HISTORY — DX: Other psychoactive substance dependence, uncomplicated: F19.20

## 2012-07-03 HISTORY — DX: Arthropathic psoriasis, unspecified: L40.50

## 2012-07-03 MED ORDER — IBUPROFEN 600 MG PO TABS: 600.0000 mg | ORAL_TABLET | Freq: Four times a day (QID) | ORAL | Status: DC | PRN

## 2012-07-03 MED ORDER — HYDROCODONE-ACETAMINOPHEN 5-325 MG PO TABS: 1.0000 | ORAL_TABLET | Freq: Four times a day (QID) | ORAL | Status: DC | PRN

## 2012-07-03 MED ORDER — HYDROMORPHONE HCL PF 2 MG/ML IJ SOLN
2.0000 mg | Freq: Once | INTRAMUSCULAR | Status: AC
  Administered 2012-07-03: 2 mg via INTRAMUSCULAR
  Filled 2012-07-03: qty 1

## 2012-07-03 MED ORDER — ACETAMINOPHEN 325 MG PO TABS
ORAL_TABLET | ORAL | Status: AC
  Administered 2012-07-03: 650 mg
  Filled 2012-07-03: qty 2

## 2012-07-03 NOTE — ED Provider Notes (Signed)
History    Scribed for Roy Kaplan, MD, the patient was seen in room MH07/MH07. This chart was scribed by Lewanda Rife, ED scribe. Patient's care was started at 2128   CSN: [id number]  Arrival date & time 07/03/12  1805   First MD Initiated Contact with Patient 07/03/12 2045      Chief Complaint  Patient presents with  . Knee Pain    (Consider location/radiation/quality/duration/timing/severity/associated sxs/prior treatment) The history is provided by the patient.   HPI Comments: Roy [name] is a 57 y.o. male who presents to the Emergency Department complaining of constant moderate non-radiating right knee pain onset yesterday following gym work out. Reports workout was less strenuous than usual. Reports increased swelling of right knee. Denies fever. Reports right knee pain is aggravated with bending and weight bearing. Denies alleviating factors. Reports taking Aleve with no relief of symptoms. Hx of right knee replacement in 2002 in [place] [facility].    Past Medical History  Diagnosis Date  . Drug addict     Clean for 20 years  . Psoriatic arthritis     Past Surgical History  Procedure Laterality Date  . Knee surgery    . Total hip arthroplasty    . Orif tibia fracture      History reviewed. No pertinent family history.  History  Substance Use Topics  . Smoking status: Not on file  . Smokeless tobacco: Not on file  . Alcohol Use: Not on file      Review of Systems  Constitutional: Negative for fever.  Musculoskeletal: Positive for joint swelling and gait problem.  All other systems reviewed and are negative.   A complete 10 system review of systems was obtained and all systems are negative except as noted in the HPI and PMH.    Allergies  Demerol; Morphine and related; and Oxycodone  Home Medications   Current Outpatient Rx  Name  Route  Sig  Dispense  Refill  . aspirin 325 MG tablet   Oral   Take 325 mg by mouth every 4 (four) hours  as needed for pain.         . calcium & magnesium carbonates (MYLANTA) 578-469 MG per tablet   Oral   Take 1 tablet by mouth daily.         . cholecalciferol (VITAMIN D) 400 UNITS TABS   Oral   Take by mouth.         . Emollient (CETAPHIL) cream   Topical   Apply topically as needed.         . etanercept (ENBREL) 50 MG/ML injection   Subcutaneous   Inject 50 mg into the skin once a week.         . folic acid (FOLVITE) 1 MG tablet   Oral   Take 1 mg by mouth daily.         . Methotrexate, PF, 20 MG/0.4ML SOAJ   Subcutaneous   Inject into the skin.         . Multiple Vitamin (MULTIVITAMIN) tablet   Oral   Take 1 tablet by mouth daily.           BP 149/79  Pulse 96  Temp(Src) 98 F (36.7 C) (Oral)  Resp 20  Ht 5\' 6"  (1.676 m)  Wt 200 lb (90.719 kg)  BMI 32.3 kg/m2  SpO2 97%  Physical Exam  Nursing note and vitals reviewed. Constitutional: He is oriented to person, place, and time. He appears well-developed  and well-nourished. No distress.  HENT:  Head: Normocephalic and atraumatic.  Eyes: EOM are normal.  Neck: Neck supple. No tracheal deviation present.  Cardiovascular: Normal rate.   Pulses:      Dorsalis pedis pulses are 2+ on the right side.  Pulmonary/Chest: Effort normal. No respiratory distress.  Musculoskeletal:       Right knee: He exhibits decreased range of motion (secondary to pain ), swelling and effusion. He exhibits no deformity. Tenderness found. Medial joint line tenderness noted.       Right lower leg: Normal. He exhibits no swelling.  Right knee exam exhibits mild warmth to touch equal to left side. No crepitus, or overlying cellulitis noted.     Right Popliteal fossa with no cyst   Neurological: He is alert and oriented to person, place, and time.  Skin: Skin is warm and dry.  Psychiatric: He has a normal mood and affect. His behavior is normal.    ED Course  Procedures (including critical care time) Medications   acetaminophen (TYLENOL) 325 MG tablet (650 mg  Given 07/03/12 2119)    Labs Reviewed - No data to display Dg Knee 1-2 Views Right  07/03/2012  *RADIOLOGY REPORT*  Clinical Data: Anterior right knee pain.  RIGHT KNEE - 1-2 VIEW  Comparison: None.  Findings: Cross-table lateral view demonstrates a moderate sized knee effusion.  Three-part total knee arthroplasty is present. There are is no osteolysis along the tibial tray medially and laterally, suggesting early loosening and / or particle disease. Similar changes are present around the femoral component.  IMPRESSION:  1.  No periprosthetic fracture. 2.  Moderate effusion. 3.  Lucency around the tibial tray and femoral components suggests early loosening which may be aseptic, particle disease or infection.   Original Report Authenticated By: Andreas Newport, M.D.      No diagnosis found.    MDM  I personally performed the services described in this documentation, which was scribed in my presence. The recorded information has been reviewed and is accurate.  Pt comes in with cc of knee pain. Hx of knee replacement and hardware. Exam not indicative of infection - appears to be a knee pain flare up from exercise, or normal wear and tear. We will get crutches, knee immobilizer and pain control. Pt understands that he needs to see his Ortho doctor soon.     Roy Kaplan, MD 07/03/12 2210

## 2012-07-03 NOTE — ED Notes (Signed)
Pt states he went to gym for work out yesterday.  Woke up at 0500 with right knee pain.  Pt unable to move right knee.

## 2012-07-04 ENCOUNTER — Encounter (HOSPITAL_COMMUNITY): Payer: Self-pay | Admitting: Certified Registered Nurse Anesthetist

## 2012-07-04 ENCOUNTER — Encounter (HOSPITAL_COMMUNITY): Payer: Self-pay | Admitting: *Deleted

## 2012-07-04 ENCOUNTER — Emergency Department (HOSPITAL_COMMUNITY): Payer: BC Managed Care – PPO | Admitting: Certified Registered Nurse Anesthetist

## 2012-07-04 ENCOUNTER — Encounter (HOSPITAL_COMMUNITY)
Admission: EM | Disposition: A | Discharge status: Home Health | Payer: Self-pay | Source: Home / Self Care | Attending: Orthopaedic Surgery

## 2012-07-04 ENCOUNTER — Inpatient Hospital Stay (HOSPITAL_COMMUNITY): Payer: BC Managed Care – PPO

## 2012-07-04 ENCOUNTER — Inpatient Hospital Stay (HOSPITAL_COMMUNITY)
Admission: EM | Admit: 2012-07-04 | Discharge: 2012-07-12 | DRG: 559 | Disposition: A | Discharge status: Home Health | Payer: BC Managed Care – PPO | Attending: Orthopaedic Surgery | Admitting: Orthopaedic Surgery

## 2012-07-04 DIAGNOSIS — A4901 Methicillin susceptible Staphylococcus aureus infection, unspecified site: Secondary | ICD-10-CM | POA: Diagnosis present

## 2012-07-04 DIAGNOSIS — T8453XA Infection and inflammatory reaction due to internal right knee prosthesis, initial encounter: Secondary | ICD-10-CM

## 2012-07-04 DIAGNOSIS — T8450XA Infection and inflammatory reaction due to unspecified internal joint prosthesis, initial encounter: Principal | ICD-10-CM | POA: Diagnosis present

## 2012-07-04 DIAGNOSIS — Z96649 Presence of unspecified artificial hip joint: Secondary | ICD-10-CM

## 2012-07-04 DIAGNOSIS — Z87891 Personal history of nicotine dependence: Secondary | ICD-10-CM

## 2012-07-04 DIAGNOSIS — Z96659 Presence of unspecified artificial knee joint: Secondary | ICD-10-CM

## 2012-07-04 DIAGNOSIS — L4050 Arthropathic psoriasis, unspecified: Secondary | ICD-10-CM | POA: Diagnosis present

## 2012-07-04 DIAGNOSIS — Y831 Surgical operation with implant of artificial internal device as the cause of abnormal reaction of the patient, or of later complication, without mention of misadventure at the time of the procedure: Secondary | ICD-10-CM | POA: Diagnosis present

## 2012-07-04 DIAGNOSIS — L4052 Psoriatic arthritis mutilans: Secondary | ICD-10-CM | POA: Diagnosis present

## 2012-07-04 DIAGNOSIS — M009 Pyogenic arthritis, unspecified: Secondary | ICD-10-CM

## 2012-07-04 HISTORY — PX: KNEE ARTHROSCOPY: SHX127

## 2012-07-04 LAB — SYNOVIAL CELL COUNT + DIFF, W/ CRYSTALS
Crystals, Fluid: NONE SEEN
Lymphocytes-Synovial Fld: 6 % (ref 0–20)
Monocyte-Macrophage-Synovial Fluid: 3 % — ABNORMAL LOW (ref 50–90)
Neutrophil, Synovial: 91 % — ABNORMAL HIGH (ref 0–25)
WBC, Synovial: 135000 /mm3 — ABNORMAL HIGH (ref 0–200)

## 2012-07-04 LAB — POCT I-STAT, CHEM 8
BUN: 16 mg/dL (ref 6–23)
Calcium, Ion: 1.13 mmol/L (ref 1.12–1.23)
Chloride: 106 mEq/L (ref 96–112)
Creatinine, Ser: 0.7 mg/dL (ref 0.50–1.35)
Glucose, Bld: 113 mg/dL — ABNORMAL HIGH (ref 70–99)
HCT: 44 % (ref 39.0–52.0)
Hemoglobin: 15 g/dL (ref 13.0–17.0)
Potassium: 3.5 mEq/L (ref 3.5–5.1)
Sodium: 139 mEq/L (ref 135–145)
TCO2: 27 mmol/L (ref 0–100)

## 2012-07-04 LAB — CBC
HCT: 42.5 % (ref 39.0–52.0)
Hemoglobin: 15.3 g/dL (ref 13.0–17.0)
MCH: 29.3 pg (ref 26.0–34.0)
MCHC: 36 g/dL (ref 30.0–36.0)
MCV: 81.4 fL (ref 78.0–100.0)
Platelets: 186 10*3/uL (ref 150–400)
RBC: 5.22 MIL/uL (ref 4.22–5.81)
RDW: 14.4 % (ref 11.5–15.5)
WBC: 14.6 10*3/uL — ABNORMAL HIGH (ref 4.0–10.5)

## 2012-07-04 LAB — GRAM STAIN

## 2012-07-04 LAB — C-REACTIVE PROTEIN: CRP: 17 mg/dL — ABNORMAL HIGH

## 2012-07-04 LAB — SEDIMENTATION RATE: Sed Rate: 20 mm/h — ABNORMAL HIGH (ref 0–16)

## 2012-07-04 SURGERY — ARTHROSCOPY, KNEE
Anesthesia: General | Laterality: Right | Wound class: Dirty or Infected

## 2012-07-04 MED ORDER — VANCOMYCIN HCL IN DEXTROSE 1-5 GM/200ML-% IV SOLN: 1000.0000 mg | Freq: Two times a day (BID) | INTRAVENOUS | Status: DC

## 2012-07-04 MED ORDER — PIPERACILLIN-TAZOBACTAM 3.375 G IVPB
3.3750 g | Freq: Three times a day (TID) | INTRAVENOUS | Status: DC
  Administered 2012-07-05 – 2012-07-07 (×9): 3.375 g via INTRAVENOUS
  Filled 2012-07-04 (×13): qty 50

## 2012-07-04 MED ORDER — MIDAZOLAM HCL 5 MG/5ML IJ SOLN
INTRAMUSCULAR | Status: DC | PRN
  Administered 2012-07-04: 2 mg via INTRAVENOUS

## 2012-07-04 MED ORDER — SODIUM CHLORIDE 0.9 % IV SOLN
Freq: Once | INTRAVENOUS | Status: AC
  Administered 2012-07-04: 21:00:00 via INTRAVENOUS

## 2012-07-04 MED ORDER — SODIUM CHLORIDE 0.9 % IV SOLN
INTRAVENOUS | Status: DC
  Administered 2012-07-05: 20 mL/h via INTRAVENOUS

## 2012-07-04 MED ORDER — SODIUM CHLORIDE 0.9 % IR SOLN
Status: DC | PRN
  Administered 2012-07-04: 6000 mL

## 2012-07-04 MED ORDER — HYDROMORPHONE HCL PF 1 MG/ML IJ SOLN
0.2500 mg | INTRAMUSCULAR | Status: DC | PRN
Start: 2012-07-04 — End: 2012-07-05
  Administered 2012-07-04 – 2012-07-05 (×2): 0.5 mg via INTRAVENOUS

## 2012-07-04 MED ORDER — PIPERACILLIN-TAZOBACTAM 3.375 G IVPB: 3.3750 g | Freq: Four times a day (QID) | INTRAVENOUS | Status: DC

## 2012-07-04 MED ORDER — HYDROMORPHONE HCL PF 1 MG/ML IJ SOLN
INTRAMUSCULAR | Status: AC
  Filled 2012-07-04: qty 1

## 2012-07-04 MED ORDER — LACTATED RINGERS IV SOLN
INTRAVENOUS | Status: DC | PRN
  Administered 2012-07-04: 22:00:00 via INTRAVENOUS

## 2012-07-04 MED ORDER — WARFARIN SODIUM 7.5 MG PO TABS
7.5000 mg | ORAL_TABLET | ORAL | Status: AC
  Administered 2012-07-05: 7.5 mg via ORAL
  Filled 2012-07-04: qty 1

## 2012-07-04 MED ORDER — FENTANYL CITRATE 0.05 MG/ML IJ SOLN
INTRAMUSCULAR | Status: DC | PRN
  Administered 2012-07-04: 50 ug via INTRAVENOUS
  Administered 2012-07-04: 100 ug via INTRAVENOUS
  Administered 2012-07-04: 50 ug via INTRAVENOUS

## 2012-07-04 MED ORDER — VANCOMYCIN HCL IN DEXTROSE 1-5 GM/200ML-% IV SOLN
1000.0000 mg | Freq: Three times a day (TID) | INTRAVENOUS | Status: AC
  Administered 2012-07-05 – 2012-07-07 (×6): 1000 mg via INTRAVENOUS
  Filled 2012-07-04 (×7): qty 200

## 2012-07-04 MED ORDER — HYDROMORPHONE HCL PF 1 MG/ML IJ SOLN
1.0000 mg | Freq: Once | INTRAMUSCULAR | Status: AC
  Administered 2012-07-04: 1 mg via INTRAMUSCULAR
  Filled 2012-07-04: qty 1

## 2012-07-04 MED ORDER — VANCOMYCIN HCL 1000 MG IV SOLR
1000.0000 mg | INTRAVENOUS | Status: DC | PRN
  Administered 2012-07-04: 1000 mg via INTRAVENOUS

## 2012-07-04 MED ORDER — WARFARIN - PHARMACIST DOSING INPATIENT: Freq: Every day | Status: DC

## 2012-07-04 MED ORDER — SODIUM CHLORIDE 0.9 % IV SOLN
INTRAVENOUS | Status: DC | PRN
  Administered 2012-07-04: 22:00:00 via INTRAVENOUS

## 2012-07-04 MED ORDER — LIDOCAINE HCL (CARDIAC) 20 MG/ML IV SOLN
INTRAVENOUS | Status: DC | PRN
  Administered 2012-07-04: 50 mg via INTRAVENOUS

## 2012-07-04 MED ORDER — SUCCINYLCHOLINE CHLORIDE 20 MG/ML IJ SOLN
INTRAMUSCULAR | Status: DC | PRN
  Administered 2012-07-04: 100 mg via INTRAVENOUS

## 2012-07-04 MED ORDER — SODIUM CHLORIDE 0.9 % IV SOLN
3.0000 g | Freq: Once | INTRAVENOUS | Status: AC
  Administered 2012-07-04: 3 g via INTRAVENOUS
  Filled 2012-07-04: qty 3

## 2012-07-04 MED ORDER — KETOROLAC TROMETHAMINE 60 MG/2ML IM SOLN
60.0000 mg | Freq: Once | INTRAMUSCULAR | Status: AC
  Administered 2012-07-04: 60 mg via INTRAMUSCULAR
  Filled 2012-07-04: qty 2

## 2012-07-04 MED ORDER — PROPOFOL 10 MG/ML IV BOLUS
INTRAVENOUS | Status: DC | PRN
  Administered 2012-07-04: 200 mg via INTRAVENOUS
  Administered 2012-07-04: 100 mg via INTRAVENOUS

## 2012-07-04 SURGICAL SUPPLY — 37 items
BLADE CUDA 5.5 (BLADE) IMPLANT
BLADE GREAT WHITE 4.2 (BLADE) IMPLANT
BNDG COHESIVE 6X5 TAN STRL LF (GAUZE/BANDAGES/DRESSINGS) ×2 IMPLANT
BUR OVAL 6.0 (BURR) IMPLANT
CLOTH BEACON ORANGE TIMEOUT ST (SAFETY) ×2 IMPLANT
COVER SURGICAL LIGHT HANDLE (MISCELLANEOUS) ×2 IMPLANT
CUFF TOURNIQUET SINGLE 34IN LL (TOURNIQUET CUFF) IMPLANT
CUFF TOURNIQUET SINGLE 44IN (TOURNIQUET CUFF) IMPLANT
DRAPE ARTHROSCOPY W/POUCH 114 (DRAPES) ×2 IMPLANT
DRAPE U-SHAPE 47X51 STRL (DRAPES) ×2 IMPLANT
DRSG EMULSION OIL 3X3 NADH (GAUZE/BANDAGES/DRESSINGS) IMPLANT
DRSG PAD ABDOMINAL 8X10 ST (GAUZE/BANDAGES/DRESSINGS) ×2 IMPLANT
DURAPREP 26ML APPLICATOR (WOUND CARE) ×2 IMPLANT
EVACUATOR 3/16  PVC DRAIN (DRAIN) ×1
EVACUATOR 3/16 PVC DRAIN (DRAIN) ×1 IMPLANT
GLOVE SURG ORTHO 9.0 STRL STRW (GLOVE) ×2 IMPLANT
GOWN PREVENTION PLUS XLARGE (GOWN DISPOSABLE) ×2 IMPLANT
GOWN SRG XL XLNG 56XLVL 4 (GOWN DISPOSABLE) ×2 IMPLANT
GOWN STRL NON-REIN XL XLG LVL4 (GOWN DISPOSABLE) ×2
KIT BASIN OR (CUSTOM PROCEDURE TRAY) ×2 IMPLANT
KIT ROOM TURNOVER OR (KITS) ×2 IMPLANT
MANIFOLD NEPTUNE II (INSTRUMENTS) ×2 IMPLANT
NEEDLE 18GX1X1/2 (RX/OR ONLY) (NEEDLE) ×2 IMPLANT
PACK ARTHROSCOPY DSU (CUSTOM PROCEDURE TRAY) ×2 IMPLANT
PAD ARMBOARD 7.5X6 YLW CONV (MISCELLANEOUS) ×4 IMPLANT
PAD CAST 4YDX4 CTTN HI CHSV (CAST SUPPLIES) ×1 IMPLANT
PADDING CAST COTTON 4X4 STRL (CAST SUPPLIES) ×1
PADDING CAST COTTON 6X4 STRL (CAST SUPPLIES) IMPLANT
SET ARTHROSCOPY TUBING (MISCELLANEOUS) ×1
SET ARTHROSCOPY TUBING LN (MISCELLANEOUS) ×1 IMPLANT
SPONGE GAUZE 4X4 12PLY (GAUZE/BANDAGES/DRESSINGS) ×2 IMPLANT
SUT ETHILON 4 0 PS 2 18 (SUTURE) ×2 IMPLANT
SYR 20CC LL (SYRINGE) ×2 IMPLANT
TOWEL OR 17X24 6PK STRL BLUE (TOWEL DISPOSABLE) ×4 IMPLANT
TUBE CONNECTING 12X1/4 (SUCTIONS) ×2 IMPLANT
WAND 90 DEG TURBOVAC W/CORD (SURGICAL WAND) IMPLANT
WATER STERILE IRR 1000ML POUR (IV SOLUTION) IMPLANT

## 2012-07-04 NOTE — Anesthesia Procedure Notes (Signed)
Procedure Name: Intubation Date/Time: 07/04/2012 9:59 PM Performed by: Julianne Rice Z Pre-anesthesia Checklist: Patient identified, Timeout performed, Emergency Drugs available, Suction available and Patient being monitored Patient Re-evaluated:Patient Re-evaluated prior to inductionOxygen Delivery Method: Circle system utilized Preoxygenation: Pre-oxygenation with 100% oxygen Intubation Type: IV induction, Rapid sequence and Cricoid Pressure applied Grade View: Grade I Tube type: Oral Tube size: 8.0 mm Number of attempts: 1 Airway Equipment and Method: Stylet and Video-laryngoscopy Placement Confirmation: ETT inserted through vocal cords under direct vision,  breath sounds checked- equal and bilateral and positive ETCO2 Secured at: 23 cm Dental Injury: Teeth and Oropharynx as per pre-operative assessment  Difficulty Due To: Difficulty was anticipated

## 2012-07-04 NOTE — ED Notes (Signed)
MD at bedside. 

## 2012-07-04 NOTE — Anesthesia Postprocedure Evaluation (Signed)
  Anesthesia Post-op Note  Patient: Roy [name]  Procedure(s) Performed: Procedure(s): ARTHROSCOPY I&D KNEE (Right)  Patient Location: PACU  Anesthesia Type:General  Level of Consciousness: awake and patient cooperative  Airway and Oxygen Therapy: Patient Spontanous Breathing and Patient connected to nasal cannula oxygen  Post-op Pain: none  Post-op Assessment: Post-op Vital signs reviewed, Patient's Cardiovascular Status Stable and Respiratory Function Stable  Post-op Vital Signs: Reviewed and stable  Complications: Hypoaeration of the left lung by cxr post op. Will encourage incentive spirometry and send to step down unit overnight

## 2012-07-04 NOTE — ED Notes (Signed)
rx x 2 for hydrocodone and ibuprofen- d/c with ride

## 2012-07-04 NOTE — H&P (Signed)
Roy [name] is an 57 y.o. male.   Chief Complaint: Septic right total knee arthroplasty HPI: Patient is a 57 year old gentleman with history of psoriatic arthritis currently on disease modifying drugs who developed swelling redness and pain in his right knee this Sunday. Initially seen in emergency room will return to the emergency room today with a white cell count greater than 100,000 with a purulent aspirate from his right knee. He is status post total knee arthroplasty approximately 2002 by Dr. [name].  Past Medical History  Diagnosis Date  . Drug addict     Clean for 20 years  . Psoriatic arthritis     Past Surgical History  Procedure Laterality Date  . Knee surgery    . Total hip arthroplasty    . Orif tibia fracture      No family history on file. Social History:  reports that he does not use illicit drugs. His tobacco and alcohol histories are not on file.  Allergies:  Allergies  Allergen Reactions  . Demerol (Meperidine) Nausea And Vomiting  . Morphine And Related Nausea And Vomiting  . Oxycodone     Patient preference (History of drug abuse)     (Not in a hospital admission)  Results for orders placed during the hospital encounter of 07/04/12 (from the past 48 hour(s))  CBC     Status: Abnormal   Collection Time    07/04/12  5:01 PM      Result Value Range   WBC 14.6 (*) 4.0 - 10.5 K/uL   RBC 5.22  4.22 - 5.81 MIL/uL   Hemoglobin 15.3  13.0 - 17.0 g/dL   HCT 21.3  08.6 - 57.8 %   MCV 81.4  78.0 - 100.0 fL   MCH 29.3  26.0 - 34.0 pg   MCHC 36.0  30.0 - 36.0 g/dL   RDW 46.9  62.9 - 52.8 %   Platelets 186  150 - 400 K/uL  SEDIMENTATION RATE     Status: Abnormal   Collection Time    07/04/12  5:01 PM      Result Value Range   Sed Rate 20 (*) 0 - 16 mm/hr  POCT I-STAT, CHEM 8     Status: Abnormal   Collection Time    07/04/12  5:20 PM      Result Value Range   Sodium 139  135 - 145 mEq/L   Potassium 3.5  3.5 - 5.1 mEq/L   Chloride 106  96 -  112 mEq/L   BUN 16  6 - 23 mg/dL   Creatinine, Ser 4.13  0.50 - 1.35 mg/dL   Glucose, Bld 244 (*) 70 - 99 mg/dL   Calcium, Ion 0.10  2.72 - 1.23 mmol/L   TCO2 27  0 - 100 mmol/L   Hemoglobin 15.0  13.0 - 17.0 g/dL   HCT 53.6  64.4 - 03.4 %  CELL COUNT + DIFF,  W/ CRYST-SYNVL FLD     Status: Abnormal   Collection Time    07/04/12  6:23 PM      Result Value Range   Color, Synovial BROWN (*) YELLOW   Appearance-Synovial TURBID (*) CLEAR   Crystals, Fluid NO CRYSTALS SEEN     WBC, Synovial 135000 (*) 0 - 200 /cu mm   Neutrophil, Synovial 91 (*) 0 - 25 %   Lymphocytes-Synovial Fld 6  0 - 20 %   Monocyte-Macrophage-Synovial Fluid 3 (*) 50 - 90 %  GRAM STAIN  Status: None   Collection Time    07/04/12  6:25 PM      Result Value Range   Specimen Description SYNOVIAL FLUID RIGHT KNEE     Special Requests NONE     Gram Stain       Value: ABUNDANT WBC PRESENT, PREDOMINANTLY PMN     ABUNDANT GRAM POSITIVE COCCI IN PAIRS     Gram Stain Report Called to,Read Back By and Verified With: RN J. CAMP 1944 07/04/12 Riki Rusk.   Report Status 07/04/2012 FINAL     Dg Knee 1-2 Views Right  07/03/2012  *RADIOLOGY REPORT*  Clinical Data: Anterior right knee pain.  RIGHT KNEE - 1-2 VIEW  Comparison: None.  Findings: Cross-table lateral view demonstrates a moderate sized knee effusion.  Three-part total knee arthroplasty is present. There are is no osteolysis along the tibial tray medially and laterally, suggesting early loosening and / or particle disease. Similar changes are present around the femoral component.  IMPRESSION:  1.  No periprosthetic fracture. 2.  Moderate effusion. 3.  Lucency around the tibial tray and femoral components suggests early loosening which may be aseptic, particle disease or infection.   Original Report Authenticated By: Andreas Newport, M.D.     Review of Systems  All other systems reviewed and are negative.    Blood pressure 122/74, pulse 93, temperature 97.7 F (36.5 C),  temperature source Oral, resp. rate 16, SpO2 97.00%. Physical Exam  On examination patient has a red swollen right knee tender to palpation tender with range of motion. Assessment/Plan Assessment: Septic right total knee arthroplasty.  Plan: Will plan for arthroscopic irrigation and debridement. We'll follow the cultures to determine final antibiotics. We'll start him on vancomycin and Zosyn. Will need to plan for repeat irrigation and debridement with polyethylene exchange.  Ambrie Carte V 07/04/2012, 9:36 PM

## 2012-07-04 NOTE — ED Notes (Signed)
Per ems- pt has hx of joint replacement. Pt was working out yesterday and had pain in his rt knee. Pt went to [facility] for pain control asd was told to followup with ortho today. Pt attempted to go to appt but was not able to move his rt knee today. Pt was told by ortho to come here and be seen by their on call MD.

## 2012-07-04 NOTE — Anesthesia Preprocedure Evaluation (Signed)
Anesthesia Evaluation  Patient identified by MRN, date of birth, ID band Patient awake    Reviewed: Allergy & Precautions, H&P , NPO status , Patient's Chart, lab work & pertinent test results  Airway Mallampati: III TM Distance: >3 FB Neck ROM: Full    Dental no notable dental hx. (+) Teeth Intact and Dental Advisory Given   Pulmonary neg pulmonary ROS,  breath sounds clear to auscultation  Pulmonary exam normal       Cardiovascular negative cardio ROS  Rhythm:Regular Rate:Normal     Neuro/Psych negative neurological ROS  negative psych ROS   GI/Hepatic negative GI ROS, Neg liver ROS, H/o substance abuse. Clean for 20 yrs   Endo/Other  Psoriatic Arthritis  Renal/GU negative Renal ROS  negative genitourinary   Musculoskeletal   Abdominal   Peds  Hematology negative hematology ROS (+)   Anesthesia Other Findings   Reproductive/Obstetrics negative OB ROS                           Anesthesia Physical Anesthesia Plan  ASA: II and emergent  Anesthesia Plan: General   Post-op Pain Management:    Induction: Intravenous, Rapid sequence and Cricoid pressure planned  Airway Management Planned: Oral ETT and Video Laryngoscope Planned  Additional Equipment:   Intra-op Plan:   Post-operative Plan: Extubation in OR  Informed Consent: I have reviewed the patients History and Physical, chart, labs and discussed the procedure including the risks, benefits and alternatives for the proposed anesthesia with the patient or authorized representative who has indicated his/her understanding and acceptance.   Dental advisory given  Plan Discussed with: CRNA  Anesthesia Plan Comments:         Anesthesia Quick Evaluation

## 2012-07-04 NOTE — ED Notes (Signed)
Consent signed. Belongings sent home with sister in law.

## 2012-07-04 NOTE — ED Notes (Signed)
Micro Probation officer with results from synovial fluid: Abundant WBC and Abundant Gram Positive. MD notified.

## 2012-07-04 NOTE — Op Note (Signed)
OPERATIVE REPORT  DATE OF SURGERY: 07/04/2012  PATIENT:  Roy [name],  57 y.o. male  PRE-OPERATIVE DIAGNOSIS:  Septic Right total knee arthroplasty  POST-OPERATIVE DIAGNOSIS:  Septic right total knee arthroplasty  PROCEDURE:  Procedure(s): ARTHROSCOPY I&D KNEE  SURGEON:  Surgeon(s): Nadara Mustard, MD  ANESTHESIA:   general  EBL:  Minimal ML  SPECIMEN:  No Specimen  TOURNIQUET:    PROCEDURE DETAILS: Patient is a 57 year old gentleman on disease modified drugs for his psoriatic arthritis. He is status post total knee arthroplasty in 2002 states he is noticing swelling and pain in the right knee since Sunday. Patient had aspiration from his knee by ER physician which showed purulence white cell count greater than 100,000 and gram stains positive for gram-positive cocci in pairs and patient presents at this time for the first stage of a staged revision procedure. Risks and benefits were discussed including persistent infection need for additional surgery. Patient states he understands and wished to proceed at this time. Description of procedure patient brought to the operating room and underwent a general anesthetic. After adequate levels of anesthesia were obtained patient's right lower extremity was prepped using DuraPrep draped into a sterile field. 3 portals were made one superior lateral inferior medial and inferior lateral. Irrigation was made through the inferior medial portal and drainage through the superior lateral and inferior lateral portal. The irrigant was initially right clavicle he and was clear after 3 L of normal saline. An additional 3 L of normal saline was also irrigated through the knee. A medium large Hemovac drain was sutured in place an inferior medial portal. The wounds were covered with Adaptic orthopedic sponges AB dressing wrap and Coban. Patient was extubated taken to the PACU in stable condition.  PLAN OF CARE: Admit to inpatient   PATIENT DISPOSITION:   PACU - hemodynamically stable.   Nadara Mustard, MD 07/04/2012 10:28 PM

## 2012-07-04 NOTE — Transfer of Care (Signed)
Immediate Anesthesia Transfer of Care Note  Patient: Roy [name]  Procedure(s) Performed: Procedure(s): ARTHROSCOPY I&D KNEE (Right)  Patient Location: PACU  Anesthesia Type:General  Level of Consciousness: awake  Airway & Oxygen Therapy: Patient Spontanous Breathing and Patient connected to face mask oxygen  Post-op Assessment: Report given to PACU RN and Post -op Vital signs reviewed and stable  Post vital signs: Reviewed and stable  Complications: CXR on arrival to PACU; pt. with RR 36; SaO2 97% on face mask O2

## 2012-07-04 NOTE — ED Provider Notes (Signed)
History     CSN: [id number]  Arrival date & time 07/04/12  1543   First MD Initiated Contact with Patient 07/04/12 1545      Chief Complaint  Patient presents with  . Knee Pain     Patient is a 57 y.o. male presenting with knee pain.  Knee Pain Location:  Knee Time since incident:  2 days Injury: no   Knee location:  R knee Pain details:    Quality:  Aching and throbbing   Radiates to:  Does not radiate   Severity:  Severe   Onset quality:  Gradual   Timing:  Constant   Progression:  Worsening Chronicity:  New Dislocation: no   Relieved by:  Ice and immobilization (narcotics; but with minimal relief) Worsened by:  Activity, flexion and bearing weight Associated symptoms: decreased ROM and swelling   Associated symptoms: no back pain, no fever, no neck pain and no numbness   Risk factors comment:  Has a history of surgery to the same knee with hardware; he has psoriasis, psoriatic arthritis, and is on immunosuprresant Enbrel  He was seen in the ED yesterday for the same, and at that time it was felt likely to be mechanical.  He was treated with narcotics and NSAIDs, but is worse today.  Past Medical History  Diagnosis Date  . Drug addict     Clean for 20 years  . Psoriatic arthritis     Past Surgical History  Procedure Laterality Date  . Knee surgery    . Total hip arthroplasty    . Orif tibia fracture      No family history on file.  History  Substance Use Topics  . Smoking status: Former Smoker    Types: Cigarettes    Quit date: 07/05/1992  . Smokeless tobacco: Not on file  . Alcohol Use: Not on file      Review of Systems  Constitutional: Negative for fever and chills.  HENT: Negative for congestion, rhinorrhea, neck pain and neck stiffness.   Eyes: Negative for visual disturbance.  Respiratory: Negative for cough and shortness of breath.   Cardiovascular: Negative for chest pain and leg swelling.  Gastrointestinal: Negative for nausea,  vomiting, abdominal pain and diarrhea.  Genitourinary: Negative for dysuria, urgency, frequency, flank pain and difficulty urinating.  Musculoskeletal: Negative for back pain.  Skin: Negative for rash.  Neurological: Negative for syncope, weakness, numbness and headaches.  All other systems reviewed and are negative.    Allergies  Demerol; Morphine and related; and Oxycodone  Home Medications   No current outpatient prescriptions on file.  BP 120/71  Pulse 86  Temp(Src) 98.7 F (37.1 C) (Oral)  Resp 26  Ht 5' 6.14" (1.68 m)  Wt 199 lb 15.3 oz (90.7 kg)  BMI 32.14 kg/m2  SpO2 95%  Physical Exam  Nursing note and vitals reviewed. Constitutional: He is oriented to person, place, and time. He appears well-developed and well-nourished. No distress.  HENT:  Head: Normocephalic and atraumatic.  Mouth/Throat: Oropharynx is clear and moist.  Eyes: Conjunctivae and EOM are normal. Pupils are equal, round, and reactive to light. No scleral icterus.  Neck: Normal range of motion. Neck supple. No JVD present.  Cardiovascular: Normal rate, regular rhythm, normal heart sounds and intact distal pulses.  Exam reveals no gallop and no friction rub.   No murmur heard. Pulmonary/Chest: Effort normal and breath sounds normal. No respiratory distress. He has no wheezes. He has no rales.  Abdominal: Soft. Bowel  sounds are normal. He exhibits no distension. There is no tenderness. There is no rebound and no guarding.  Musculoskeletal: He exhibits no edema.       Right knee: He exhibits decreased range of motion, swelling and effusion. He exhibits no erythema. Tenderness found. Medial joint line and lateral joint line tenderness noted.  Knee is warm to touch; severe pain with active and passive ROM  Neurological: He is alert and oriented to person, place, and time. No cranial nerve deficit. He exhibits normal muscle tone. Coordination normal.  Skin: Skin is warm and dry. He is not diaphoretic.     ED Course  ARTHOCENTESIS Date/Time: 07/04/2012 1:34 PM Performed by: Toney Sang Authorized by: Toney Sang Consent: Verbal consent obtained. Risks and benefits: risks, benefits and alternatives were discussed Consent given by: patient Patient understanding: patient states understanding of the procedure being performed Patient identity confirmed: verbally with patient and arm band Indications: joint swelling, pain, possible septic joint and diagnostic evaluation  Body area: knee Joint: right knee Local anesthesia used: yes Anesthesia: local infiltration Local anesthetic: lidocaine 2% with epinephrine Preparation: Patient was prepped and draped in the usual sterile fashion. Needle gauge: 18 G Approach: lateral Aspirate: purulent Aspirate amount: 18 ml Patient tolerance: Patient tolerated the procedure well with no immediate complications.   (including critical care time)  Labs Reviewed  CBC - Abnormal; Notable for the following:    WBC 14.6 (*)    All other components within normal limits  SEDIMENTATION RATE - Abnormal; Notable for the following:    Sed Rate 20 (*)    All other components within normal limits  C-REACTIVE PROTEIN - Abnormal; Notable for the following:    CRP 17.0 (*)    All other components within normal limits  CELL COUNT + DIFF,  W/ CRYST-SYNVL FLD - Abnormal; Notable for the following:    Color, Synovial BROWN (*)    Appearance-Synovial TURBID (*)    WBC, Synovial 135000 (*)    Neutrophil, Synovial 91 (*)    Monocyte-Macrophage-Synovial Fluid 3 (*)    All other components within normal limits  POCT I-STAT, CHEM 8 - Abnormal; Notable for the following:    Glucose, Bld 113 (*)    All other components within normal limits  BODY FLUID CULTURE  GRAM STAIN  MRSA PCR SCREENING  GLUCOSE, SYNOVIAL FLUID  PROTIME-INR  PATHOLOGIST SMEAR REVIEW   Dg Knee 1-2 Views Right  07/03/2012  *RADIOLOGY REPORT*  Clinical Data: Anterior right knee pain.   RIGHT KNEE - 1-2 VIEW  Comparison: None.  Findings: Cross-table lateral view demonstrates a moderate sized knee effusion.  Three-part total knee arthroplasty is present. There are is no osteolysis along the tibial tray medially and laterally, suggesting early loosening and / or particle disease. Similar changes are present around the femoral component.  IMPRESSION:  1.  No periprosthetic fracture. 2.  Moderate effusion. 3.  Lucency around the tibial tray and femoral components suggests early loosening which may be aseptic, particle disease or infection.   Original Report Authenticated By: Andreas Newport, M.D.    Dg Chest Port 1 View  07/04/2012  *RADIOLOGY REPORT*  Clinical Data: Postop, shortness of breath  PORTABLE CHEST - 1 VIEW  Comparison: 05/07/2011  Findings: Patchy airspace opacity left lung.  Hypoaeration with interstitial and vascular crowding.  Cardiomediastinal prominence is likely accentuated by hypoaeration.  No pneumothorax or definite pleural effusion.  Multilevel degenerative changes.  IMPRESSION: Patchy airspace opacity left lung may reflect asymmetric edema,  pneumonia, or aspiration in the appropriate clinical setting.   Original Report Authenticated By: Jearld Lesch, M.D.      1. Septic arthritis of knee, right       MDM  57 yo M with h/o psoriatic arthritis, on Enbrel, here with severe knee pain.  Seen yesterday for the same.  Afebrile.  Pain much worse with ROM. On exam, non-toxic appearing, NAD, right knee with effusion, severe pain with passive ROM, warm to touch.  Performed bedside arthrocentesis.  Frankly purulent synovial fluid removed.  Synovial WBC 135,000, neutrophils 91%, Gram stain with ++WBC and abundant GPC in pairs.  ESR 20.  Leukocytosis to 14.6.    Septic arthritis in immunosuppressed patient (on Enbrel) with hardware in affected knee.  Covered with Unasyn.  Taken to OR with orthopedic surgery   Toney Sang, MD 07/05/12 1336  I performed a history and  physical examination of  Sandra Cockayne and discussed his management with Dr. Peyton Najjar. I agree with the history, physical, assessment, and plan of care, with the following exceptions: None I was present for the following procedures: Right knee Arthrocenthesis  Time Spent in Critical Care of the patient: None  Time spent in discussions with the patient and family: 15-20 minutes  Pt returned to the ER per return precautions from the day earlier. Per Exam of Dr. Peyton Najjar, the knee was warmer compared to the contralateral side. Arthrocentesis confirmed septic arthritis, and Orthopedic was consulted immediately for intra operative washout.  Derwood Kaplan  Derwood Kaplan, MD 07/11/12 [phone]

## 2012-07-04 NOTE — Progress Notes (Signed)
ANTICOAGULATION + ANTIBIOTIC CONSULT NOTE - Initial Consult  Pharmacy Consult:  Coumadin / Vancomycin + Zosyn Indication:  VTE prophylaxis / Septic knee  Allergies  Allergen Reactions  . Demerol (Meperidine) Nausea And Vomiting  . Morphine And Related Nausea And Vomiting  . Oxycodone     Patient preference (History of drug abuse)    Patient Measurements: Height: 5' 6.14" (168 cm) Weight: 199 lb 15.3 oz (90.7 kg) IBW/kg (Calculated) : 64.13  Vital Signs: Temp: 97.7 F (36.5 C) (05/12 2253) Temp src: Oral (05/12 1544) BP: 122/74 mmHg (05/12 2108) Pulse Rate: 122 (05/12 2253)  Labs:  Recent Labs  07/04/12 1701 07/04/12 1720  HGB 15.3 15.0  HCT 42.5 44.0  PLT 186  --   CREATININE  --  0.70    Estimated Creatinine Clearance: 106.3 ml/min (by C-G formula based on Cr of 0.7).   Medical History: Past Medical History  Diagnosis Date  . Drug addict     Clean for 20 years  . Psoriatic arthritis         Assessment: 48 YOM with history of psoriatic arthritis s/p TKA in ~2002 presented with swelling, redness, and pain in his right knee.  Now s/p I&D and to start Coumadin for VTE prophylaxis.  No baseline INR but expect it to be close to one as patient is not on anticoagulation PTA.  He has great renal clearance and has received vancomycin 1gm pre-op as well as Unasyn.   Goal of Therapy:  INR 2-3 Monitor platelets by anticoagulation protocol: Yes Vanc trough 15-20 mcg/mL     Plan:  - Coumadin 7.5mg  PO now per MD request - Daily PT / INR - Coumadin book / video - Change Zosyn to 3.375gm IV Q8H, 4 hr infusion - Change vanc to 1gm IV Q8H, next dose tomorrow at 0600 - Monitor renal fxn, micro data to de-escalate abx, vanc trough prior to 4th dose      Roy [name] D. Laney Potash, PharmD, BCPS Pager:  [phone] 07/04/2012, 11:13 PM

## 2012-07-05 ENCOUNTER — Encounter (HOSPITAL_COMMUNITY): Payer: Self-pay

## 2012-07-05 LAB — GLUCOSE, CAPILLARY: Glucose-Capillary: 119 mg/dL — ABNORMAL HIGH (ref 70–99)

## 2012-07-05 LAB — PROTIME-INR
INR: 1.1 (ref 0.00–1.49)
Prothrombin Time: 14.1 seconds (ref 11.6–15.2)

## 2012-07-05 LAB — MRSA PCR SCREENING: MRSA by PCR: NEGATIVE

## 2012-07-05 LAB — VANCOMYCIN, TROUGH: Vancomycin Tr: 5 ug/mL — ABNORMAL LOW (ref 10.0–20.0)

## 2012-07-05 LAB — PATHOLOGIST SMEAR REVIEW

## 2012-07-05 LAB — GLUCOSE, SYNOVIAL FLUID: Glucose, Synovial Fluid: <20 mg/dL

## 2012-07-05 MED ORDER — HYDROCERIN EX CREA
TOPICAL_CREAM | Freq: Two times a day (BID) | CUTANEOUS | Status: DC | PRN
  Filled 2012-07-05 (×2): qty 113

## 2012-07-05 MED ORDER — ONDANSETRON HCL 4 MG PO TABS: 4.0000 mg | ORAL_TABLET | Freq: Four times a day (QID) | ORAL | Status: DC | PRN

## 2012-07-05 MED ORDER — WARFARIN SODIUM 7.5 MG PO TABS
7.5000 mg | ORAL_TABLET | Freq: Once | ORAL | Status: AC
  Administered 2012-07-05: 7.5 mg via ORAL
  Filled 2012-07-05: qty 1

## 2012-07-05 MED ORDER — METOCLOPRAMIDE HCL 5 MG/ML IJ SOLN
5.0000 mg | Freq: Three times a day (TID) | INTRAMUSCULAR | Status: DC | PRN
  Administered 2012-07-05: 10 mg via INTRAVENOUS
  Filled 2012-07-05 (×2): qty 2

## 2012-07-05 MED ORDER — HYDROMORPHONE HCL PF 1 MG/ML IJ SOLN
0.5000 mg | INTRAMUSCULAR | Status: DC | PRN
  Administered 2012-07-05 (×3): 1 mg via INTRAVENOUS
  Filled 2012-07-05 (×3): qty 1

## 2012-07-05 MED ORDER — CETAPHIL MOISTURIZING EX CREA: 1.0000 "application " | TOPICAL_CREAM | Freq: Two times a day (BID) | CUTANEOUS | Status: DC | PRN

## 2012-07-05 MED ORDER — HYDROCODONE-ACETAMINOPHEN 5-325 MG PO TABS
1.0000 | ORAL_TABLET | ORAL | Status: DC | PRN
  Administered 2012-07-05 – 2012-07-07 (×7): 2 via ORAL
  Administered 2012-07-08: 1 via ORAL
  Administered 2012-07-08 – 2012-07-09 (×6): 2 via ORAL
  Administered 2012-07-09: 1 via ORAL
  Administered 2012-07-10 – 2012-07-12 (×7): 2 via ORAL
  Filled 2012-07-05 (×6): qty 2
  Filled 2012-07-05: qty 1
  Filled 2012-07-05 (×15): qty 2

## 2012-07-05 MED ORDER — METOCLOPRAMIDE HCL 5 MG PO TABS
5.0000 mg | ORAL_TABLET | Freq: Three times a day (TID) | ORAL | Status: DC | PRN
  Filled 2012-07-05: qty 2

## 2012-07-05 MED ORDER — ONDANSETRON HCL 4 MG/2ML IJ SOLN
4.0000 mg | Freq: Four times a day (QID) | INTRAMUSCULAR | Status: DC | PRN
  Administered 2012-07-05: 4 mg via INTRAVENOUS
  Filled 2012-07-05: qty 2

## 2012-07-05 NOTE — Progress Notes (Signed)
Patient ID: BRODERIC BARA, male   DOB: 02/17/1956, 57 y.o.   MRN: [id number] Mr Lofton is well known to me with hx of psoriatic polyarthralgias S/P right TKR in 2002 and right THR in 2001. Have not seen recently as he has enjoyed relative good health being followed by Dr Corliss Skains with Enbrel and Methotrexate.Developed rather acute onset of right knee pain two days ago prompting an ER visit-no dx.Came to [facility] ER last night with painful right knee and aspirate pos for pus. Taken to OR by Dr Lajoyce Corners for I and D-gram stain pos for gram pos cocci-no culture as yet. Suspect MRSA and will require two stage revision. Plan on surgery later ion the week pending cultures-discussed with patient. Relatively comfortable today.Respiratory status stable with O2 sats 97%. Will follow.

## 2012-07-05 NOTE — Progress Notes (Signed)
ANTICOAGULATION + ANTIBIOTIC CONSULT NOTE - Initial Consult  Pharmacy Consult:  Coumadin / Vancomycin + Zosyn Indication:  VTE prophylaxis / Septic knee  Allergies  Allergen Reactions  . Demerol (Meperidine) Nausea And Vomiting  . Morphine And Related Nausea And Vomiting  . Oxycodone     Patient preference (History of drug abuse)    Patient Measurements: Height: 5' 6.14" (168 cm) Weight: 199 lb 15.3 oz (90.7 kg) IBW/kg (Calculated) : 64.13  Vital Signs: Temp: 99 F (37.2 C) (05/13 0700) Temp src: Oral (05/13 0700) BP: 117/67 mmHg (05/13 0417) Pulse Rate: 99 (05/13 0417)  Labs:  Recent Labs  07/04/12 1701 07/04/12 1720 07/05/12 0400  HGB 15.3 15.0  --   HCT 42.5 44.0  --   PLT 186  --   --   LABPROT  --   --  14.1  INR  --   --  1.10  CREATININE  --  0.70  --     Estimated Creatinine Clearance: 106.3 ml/min (by C-G formula based on Cr of 0.7).   Medical History: Past Medical History  Diagnosis Date  . Drug addict     Clean for 20 years  . Psoriatic arthritis         Assessment: 27 YOM with history of psoriatic arthritis s/p TKA in ~2002 presented with swelling, redness, and pain in his right knee.  Now s/p I&D and to start Coumadin for VTE prophylaxis.  INR 1.1 this AM. Now on vanc/zosyn for septic knee. Knee fluid culture is showing GPC but no growth yet.   Goal of Therapy:  INR 2-3 Monitor platelets by anticoagulation protocol: Yes Vanc trough 15-20 mcg/mL   Plan:  - Coumadin 7.5mg  PO x1 - Daily PT / INR - Change Zosyn to 3.375gm IV Q8H, 4 hr infusion - Vanc 1g IV q8 - Monitor renal fxn, micro data to de-escalate abx, vanc trough prior to 4th dose

## 2012-07-05 NOTE — Progress Notes (Signed)
Report called to [facility], receiving RN on 5N.  Pt transferred to 5N14 via bed with all belongings. Pt to notify family of new room.   Roselie Awkward, RN

## 2012-07-05 NOTE — Progress Notes (Signed)
Utilization review completed.  

## 2012-07-05 NOTE — Progress Notes (Signed)
Patient ID: Roy [name], male   DOB: January 07, 1956, 57 y.o.   MRN: [id number] Postoperative day 1 status post arthroscopic irrigation and debridement for septic right total knee arthroplasty. Patient is doing fine no shortness of breath no complaints. Final cultures pending. With the patient's periarticular bony changes arthrofibrosis and disease modifying drugs with gram-positive cocci infection I feel the patient most likely will benefit from a two-stage revision total knee arthroplasty with first removal of components placement of antibiotic spacer followed by a revision to a revision total knee arthroplasty.

## 2012-07-06 LAB — COMPREHENSIVE METABOLIC PANEL
ALT: 27 U/L (ref 0–53)
AST: 31 U/L (ref 0–37)
Albumin: 2.3 g/dL — ABNORMAL LOW (ref 3.5–5.2)
Alkaline Phosphatase: 65 U/L (ref 39–117)
BUN: 10 mg/dL (ref 6–23)
CO2: 28 mEq/L (ref 19–32)
Calcium: 8.7 mg/dL (ref 8.4–10.5)
Chloride: 99 mEq/L (ref 96–112)
Creatinine, Ser: 0.9 mg/dL (ref 0.50–1.35)
GFR calc Af Amer: >90 mL/min (ref >90)
GFR calc non Af Amer: >90 mL/min (ref >90)
Glucose, Bld: 120 mg/dL — ABNORMAL HIGH (ref 70–99)
Potassium: 3.4 mEq/L — ABNORMAL LOW (ref 3.5–5.1)
Sodium: 135 mEq/L (ref 135–145)
Total Bilirubin: 0.4 mg/dL (ref 0.3–1.2)
Total Protein: 6.5 g/dL (ref 6.0–8.3)

## 2012-07-06 LAB — CBC
HCT: 40.2 % (ref 39.0–52.0)
Hemoglobin: 14.3 g/dL (ref 13.0–17.0)
MCH: 29.1 pg (ref 26.0–34.0)
MCHC: 35.6 g/dL (ref 30.0–36.0)
MCV: 81.9 fL (ref 78.0–100.0)
Platelets: 167 10*3/uL (ref 150–400)
RBC: 4.91 MIL/uL (ref 4.22–5.81)
RDW: 14.4 % (ref 11.5–15.5)
WBC: 6.8 10*3/uL (ref 4.0–10.5)

## 2012-07-06 LAB — PROTIME-INR
INR: 1.64 — ABNORMAL HIGH (ref 0.00–1.49)
Prothrombin Time: 18.9 seconds — ABNORMAL HIGH (ref 11.6–15.2)

## 2012-07-06 LAB — TYPE AND SCREEN
ABO/RH(D): B POS
Antibody Screen: NEGATIVE

## 2012-07-06 LAB — VANCOMYCIN, TROUGH: Vancomycin Tr: 8.7 ug/mL — ABNORMAL LOW (ref 10.0–20.0)

## 2012-07-06 MED ORDER — ACETAMINOPHEN 325 MG PO TABS: 650.0000 mg | ORAL_TABLET | Freq: Four times a day (QID) | ORAL | Status: DC | PRN

## 2012-07-06 MED ORDER — SODIUM CHLORIDE 0.9 % IV SOLN: INTRAVENOUS | Status: DC

## 2012-07-06 NOTE — Progress Notes (Signed)
Patient ID: Roy [name], male   DOB: 1955-11-06, 57 y.o.   MRN: [id number] hemovac drainage minimal-will D/C.. N/v intact to right foot, denies SOB or chest pain. Cultures not available yet, but gram satin with gram Pos cocci in pairs. Plan on exploring right knee tomorrow am with probable removal of components and insert antibiotic spacer.

## 2012-07-06 NOTE — Progress Notes (Signed)
Pt temp this morning is 102.7. Pt given Norco for pain and assist with temp. Paged Dr. Ophelia Charter about temp. Will follow up with tylenol.

## 2012-07-07 ENCOUNTER — Encounter (HOSPITAL_COMMUNITY): Payer: Self-pay | Admitting: Anesthesiology

## 2012-07-07 ENCOUNTER — Inpatient Hospital Stay (HOSPITAL_COMMUNITY): Payer: BC Managed Care – PPO | Admitting: Anesthesiology

## 2012-07-07 ENCOUNTER — Encounter (HOSPITAL_COMMUNITY)
Admission: EM | Disposition: A | Discharge status: Home Health | Payer: Self-pay | Source: Home / Self Care | Attending: Orthopaedic Surgery

## 2012-07-07 DIAGNOSIS — M009 Pyogenic arthritis, unspecified: Secondary | ICD-10-CM

## 2012-07-07 DIAGNOSIS — T8453XA Infection and inflammatory reaction due to internal right knee prosthesis, initial encounter: Secondary | ICD-10-CM

## 2012-07-07 DIAGNOSIS — M869 Osteomyelitis, unspecified: Secondary | ICD-10-CM

## 2012-07-07 DIAGNOSIS — L4052 Psoriatic arthritis mutilans: Secondary | ICD-10-CM | POA: Diagnosis present

## 2012-07-07 HISTORY — PX: EXCISIONAL TOTAL KNEE ARTHROPLASTY WITH ANTIBIOTIC SPACERS: SHX5827

## 2012-07-07 LAB — BODY FLUID CULTURE

## 2012-07-07 LAB — PROTIME-INR
INR: 1.78 — ABNORMAL HIGH (ref 0.00–1.49)
Prothrombin Time: 20.1 seconds — ABNORMAL HIGH (ref 11.6–15.2)

## 2012-07-07 LAB — APTT: aPTT: 42 seconds — ABNORMAL HIGH (ref 24–37)

## 2012-07-07 SURGERY — REMOVAL, TOTAL ARTHROPLASTY HARDWARE, KNEE, WITH ANTIBIOTIC SPACER INSERTION
Anesthesia: General | Site: Knee | Laterality: Right | Wound class: Dirty or Infected

## 2012-07-07 MED ORDER — RIVAROXABAN 10 MG PO TABS
10.0000 mg | ORAL_TABLET | Freq: Once | ORAL | Status: AC
  Administered 2012-07-07: 10 mg via ORAL
  Filled 2012-07-07: qty 1

## 2012-07-07 MED ORDER — MINERAL OIL LIGHT 100 % EX OIL
TOPICAL_OIL | CUTANEOUS | Status: DC | PRN
  Administered 2012-07-07: 1 via TOPICAL

## 2012-07-07 MED ORDER — LIDOCAINE HCL (CARDIAC) 20 MG/ML IV SOLN
INTRAVENOUS | Status: DC | PRN
  Administered 2012-07-07: 100 mg via INTRAVENOUS

## 2012-07-07 MED ORDER — BUPIVACAINE-EPINEPHRINE 0.25% -1:200000 IJ SOLN
INTRAMUSCULAR | Status: DC | PRN
  Administered 2012-07-07: 30 mL

## 2012-07-07 MED ORDER — VANCOMYCIN HCL 10 G IV SOLR
1500.0000 mg | Freq: Three times a day (TID) | INTRAVENOUS | Status: DC
  Filled 2012-07-07: qty 1500

## 2012-07-07 MED ORDER — NEOSTIGMINE METHYLSULFATE 1 MG/ML IJ SOLN
INTRAMUSCULAR | Status: DC | PRN
  Administered 2012-07-07: 4 mg via INTRAVENOUS

## 2012-07-07 MED ORDER — BUPIVACAINE-EPINEPHRINE PF 0.5-1:200000 % IJ SOLN
INTRAMUSCULAR | Status: DC | PRN
  Administered 2012-07-07: 25 mL

## 2012-07-07 MED ORDER — ARTIFICIAL TEARS OP OINT
TOPICAL_OINTMENT | OPHTHALMIC | Status: DC | PRN
  Administered 2012-07-07: 1 via OPHTHALMIC

## 2012-07-07 MED ORDER — ROCURONIUM BROMIDE 100 MG/10ML IV SOLN
INTRAVENOUS | Status: DC | PRN
  Administered 2012-07-07: 50 mg via INTRAVENOUS

## 2012-07-07 MED ORDER — METHOCARBAMOL 500 MG PO TABS
500.0000 mg | ORAL_TABLET | Freq: Four times a day (QID) | ORAL | Status: DC | PRN
  Administered 2012-07-07 – 2012-07-12 (×10): 500 mg via ORAL
  Filled 2012-07-07 (×10): qty 1

## 2012-07-07 MED ORDER — HYDROMORPHONE HCL PF 1 MG/ML IJ SOLN
INTRAMUSCULAR | Status: AC
  Filled 2012-07-07: qty 1

## 2012-07-07 MED ORDER — MAGNESIUM HYDROXIDE 400 MG/5ML PO SUSP: 30.0000 mL | Freq: Every day | ORAL | Status: DC | PRN

## 2012-07-07 MED ORDER — MENTHOL 3 MG MT LOZG: 1.0000 | LOZENGE | OROMUCOSAL | Status: DC | PRN

## 2012-07-07 MED ORDER — FENTANYL CITRATE 0.05 MG/ML IJ SOLN
INTRAMUSCULAR | Status: DC | PRN
  Administered 2012-07-07: 50 ug via INTRAVENOUS
  Administered 2012-07-07: 100 ug via INTRAVENOUS
  Administered 2012-07-07 (×2): 50 ug via INTRAVENOUS
  Administered 2012-07-07 (×3): 100 ug via INTRAVENOUS
  Administered 2012-07-07: 50 ug via INTRAVENOUS

## 2012-07-07 MED ORDER — METOCLOPRAMIDE HCL 10 MG PO TABS: 5.0000 mg | ORAL_TABLET | Freq: Three times a day (TID) | ORAL | Status: DC | PRN

## 2012-07-07 MED ORDER — DOCUSATE SODIUM 100 MG PO CAPS
100.0000 mg | ORAL_CAPSULE | Freq: Two times a day (BID) | ORAL | Status: DC
  Administered 2012-07-07 – 2012-07-12 (×10): 100 mg via ORAL
  Filled 2012-07-07 (×13): qty 1

## 2012-07-07 MED ORDER — DEXTROSE 5 % IV SOLN
500.0000 mg | Freq: Four times a day (QID) | INTRAVENOUS | Status: DC | PRN
  Filled 2012-07-07: qty 5

## 2012-07-07 MED ORDER — ONDANSETRON HCL 4 MG/2ML IJ SOLN
INTRAMUSCULAR | Status: DC | PRN
  Administered 2012-07-07: 4 mg via INTRAVENOUS

## 2012-07-07 MED ORDER — PROPOFOL 10 MG/ML IV BOLUS
INTRAVENOUS | Status: DC | PRN
  Administered 2012-07-07: 200 mg via INTRAVENOUS

## 2012-07-07 MED ORDER — SODIUM CHLORIDE 0.9 % IR SOLN
Status: DC | PRN
  Administered 2012-07-07: 6000 mL

## 2012-07-07 MED ORDER — VANCOMYCIN HCL 1000 MG IV SOLR
INTRAVENOUS | Status: DC | PRN
  Administered 2012-07-07: 2000 mg

## 2012-07-07 MED ORDER — METOCLOPRAMIDE HCL 5 MG/ML IJ SOLN: 5.0000 mg | Freq: Three times a day (TID) | INTRAMUSCULAR | Status: DC | PRN

## 2012-07-07 MED ORDER — ALUM & MAG HYDROXIDE-SIMETH 200-200-20 MG/5ML PO SUSP: 30.0000 mL | ORAL | Status: DC | PRN

## 2012-07-07 MED ORDER — BISACODYL 10 MG RE SUPP: 10.0000 mg | Freq: Every day | RECTAL | Status: DC | PRN

## 2012-07-07 MED ORDER — ONDANSETRON HCL 4 MG/2ML IJ SOLN: 4.0000 mg | Freq: Four times a day (QID) | INTRAMUSCULAR | Status: DC | PRN

## 2012-07-07 MED ORDER — KETOROLAC TROMETHAMINE 15 MG/ML IJ SOLN
15.0000 mg | Freq: Four times a day (QID) | INTRAMUSCULAR | Status: AC
  Administered 2012-07-07: 15 mg via INTRAVENOUS
  Filled 2012-07-07 (×2): qty 1

## 2012-07-07 MED ORDER — ACETAMINOPHEN 10 MG/ML IV SOLN
1000.0000 mg | Freq: Once | INTRAVENOUS | Status: AC
  Administered 2012-07-07: 1000 mg via INTRAVENOUS

## 2012-07-07 MED ORDER — LABETALOL HCL 5 MG/ML IV SOLN
INTRAVENOUS | Status: DC | PRN
  Administered 2012-07-07 (×3): 10 mg via INTRAVENOUS

## 2012-07-07 MED ORDER — DEXAMETHASONE SODIUM PHOSPHATE 4 MG/ML IJ SOLN
INTRAMUSCULAR | Status: DC | PRN
  Administered 2012-07-07: 4 mg

## 2012-07-07 MED ORDER — RIVAROXABAN 10 MG PO TABS
10.0000 mg | ORAL_TABLET | Freq: Every day | ORAL | Status: DC
  Administered 2012-07-08 – 2012-07-11 (×4): 10 mg via ORAL
  Filled 2012-07-07 (×5): qty 1

## 2012-07-07 MED ORDER — ONDANSETRON HCL 4 MG PO TABS: 4.0000 mg | ORAL_TABLET | Freq: Four times a day (QID) | ORAL | Status: DC | PRN

## 2012-07-07 MED ORDER — ALBUTEROL SULFATE HFA 108 (90 BASE) MCG/ACT IN AERS
INHALATION_SPRAY | RESPIRATORY_TRACT | Status: DC | PRN
  Administered 2012-07-07: 200 via RESPIRATORY_TRACT
  Administered 2012-07-07: 2 via RESPIRATORY_TRACT

## 2012-07-07 MED ORDER — MIDAZOLAM HCL 5 MG/5ML IJ SOLN
INTRAMUSCULAR | Status: DC | PRN
  Administered 2012-07-07 (×2): 1 mg via INTRAVENOUS

## 2012-07-07 MED ORDER — KETOROLAC TROMETHAMINE 30 MG/ML IJ SOLN
INTRAMUSCULAR | Status: AC
  Administered 2012-07-07: 30 mg
  Filled 2012-07-07: qty 1

## 2012-07-07 MED ORDER — LACTATED RINGERS IV SOLN
INTRAVENOUS | Status: DC | PRN
  Administered 2012-07-07 (×2): via INTRAVENOUS

## 2012-07-07 MED ORDER — PHENOL 1.4 % MT LIQD: 1.0000 | OROMUCOSAL | Status: DC | PRN

## 2012-07-07 MED ORDER — SODIUM CHLORIDE 0.9 % IV SOLN
1500.0000 mg | Freq: Three times a day (TID) | INTRAVENOUS | Status: DC
  Administered 2012-07-07 (×2): 1500 mg via INTRAVENOUS
  Filled 2012-07-07 (×4): qty 1500

## 2012-07-07 MED ORDER — SODIUM CHLORIDE 0.9 % IV SOLN: 75.0000 mL/h | INTRAVENOUS | Status: DC

## 2012-07-07 MED ORDER — HYDROMORPHONE HCL PF 1 MG/ML IJ SOLN
0.2500 mg | INTRAMUSCULAR | Status: DC | PRN
  Administered 2012-07-07 (×4): 0.5 mg via INTRAVENOUS

## 2012-07-07 MED ORDER — CEFAZOLIN SODIUM 1-5 GM-% IV SOLN
1.0000 g | Freq: Three times a day (TID) | INTRAVENOUS | Status: DC
  Administered 2012-07-07 – 2012-07-08 (×2): 1 g via INTRAVENOUS
  Filled 2012-07-07 (×4): qty 50

## 2012-07-07 MED ORDER — ACETAMINOPHEN 10 MG/ML IV SOLN
1000.0000 mg | Freq: Four times a day (QID) | INTRAVENOUS | Status: AC
  Administered 2012-07-07 – 2012-07-08 (×3): 1000 mg via INTRAVENOUS
  Filled 2012-07-07 (×4): qty 100

## 2012-07-07 MED ORDER — GLYCOPYRROLATE 0.2 MG/ML IJ SOLN
INTRAMUSCULAR | Status: DC | PRN
  Administered 2012-07-07: 0.2 mg via INTRAVENOUS
  Administered 2012-07-07: 0.6 mg via INTRAVENOUS

## 2012-07-07 MED ORDER — FLEET ENEMA 7-19 GM/118ML RE ENEM: 1.0000 | ENEMA | Freq: Once | RECTAL | Status: AC | PRN

## 2012-07-07 SURGICAL SUPPLY — 65 items
BANDAGE ESMARK 6X9 LF (GAUZE/BANDAGES/DRESSINGS) ×1 IMPLANT
BLADE SAGITTAL 25.0X1.19X90 (BLADE) ×2 IMPLANT
BNDG ESMARK 6X9 LF (GAUZE/BANDAGES/DRESSINGS) ×2
BONE CEMENT PALACOS R-G (Orthopedic Implant) ×6 IMPLANT
BOWL SMART MIX CTS (DISPOSABLE) ×6 IMPLANT
BRUSH FEMORAL CANAL (MISCELLANEOUS) ×2 IMPLANT
CEMENT BONE PALACOS R-G (Orthopedic Implant) ×3 IMPLANT
CLOTH BEACON ORANGE TIMEOUT ST (SAFETY) ×2 IMPLANT
COVER BACK TABLE 24X17X13 BIG (DRAPES) ×2 IMPLANT
COVER SURGICAL LIGHT HANDLE (MISCELLANEOUS) ×2 IMPLANT
CUFF TOURNIQUET SINGLE 34IN LL (TOURNIQUET CUFF) ×2 IMPLANT
CUFF TOURNIQUET SINGLE 44IN (TOURNIQUET CUFF) IMPLANT
DRAPE EXTREMITY T 121X128X90 (DRAPE) ×2 IMPLANT
DRAPE PROXIMA HALF (DRAPES) ×2 IMPLANT
DRSG ADAPTIC 3X8 NADH LF (GAUZE/BANDAGES/DRESSINGS) ×2 IMPLANT
DRSG PAD ABDOMINAL 8X10 ST (GAUZE/BANDAGES/DRESSINGS) ×2 IMPLANT
DURAPREP 26ML APPLICATOR (WOUND CARE) ×2 IMPLANT
ELECT CAUTERY BLADE 6.4 (BLADE) ×2 IMPLANT
ELECT REM PT RETURN 9FT ADLT (ELECTROSURGICAL) ×2
ELECTRODE REM PT RTRN 9FT ADLT (ELECTROSURGICAL) ×1 IMPLANT
EVACUATOR 1/8 PVC DRAIN (DRAIN) ×2 IMPLANT
FACESHIELD LNG OPTICON STERILE (SAFETY) ×4 IMPLANT
FLOSEAL 10ML (HEMOSTASIS) ×2 IMPLANT
GLOVE BIOGEL PI IND STRL 8 (GLOVE) ×1 IMPLANT
GLOVE BIOGEL PI IND STRL 8.5 (GLOVE) ×1 IMPLANT
GLOVE BIOGEL PI INDICATOR 8 (GLOVE) ×1
GLOVE BIOGEL PI INDICATOR 8.5 (GLOVE) ×1
GLOVE ECLIPSE 8.0 STRL XLNG CF (GLOVE) ×8 IMPLANT
GLOVE SURG ORTHO 8.5 STRL (GLOVE) ×6 IMPLANT
GOWN PREVENTION PLUS XLARGE (GOWN DISPOSABLE) ×2 IMPLANT
GOWN STRL NON-REIN LRG LVL3 (GOWN DISPOSABLE) ×4 IMPLANT
HANDPIECE INTERPULSE COAX TIP (DISPOSABLE) ×1
INSERT TIB LCS RP STD+ 10 (Knees) ×2 IMPLANT
KIT BASIN OR (CUSTOM PROCEDURE TRAY) ×2 IMPLANT
KIT ROOM TURNOVER OR (KITS) ×2 IMPLANT
MANIFOLD NEPTUNE II (INSTRUMENTS) ×2 IMPLANT
MARKER SPHERE PSV REFLC THRD 5 (MARKER) ×6 IMPLANT
NEEDLE 22X1 1/2 (OR ONLY) (NEEDLE) ×2 IMPLANT
NS IRRIG 1000ML POUR BTL (IV SOLUTION) ×2 IMPLANT
PACK TOTAL JOINT (CUSTOM PROCEDURE TRAY) ×2 IMPLANT
PAD ARMBOARD 7.5X6 YLW CONV (MISCELLANEOUS) ×4 IMPLANT
PAD CAST 4YDX4 CTTN HI CHSV (CAST SUPPLIES) ×1 IMPLANT
PADDING CAST COTTON 4X4 STRL (CAST SUPPLIES) ×1
PADDING CAST COTTON 6X4 STRL (CAST SUPPLIES) ×2 IMPLANT
PIN SCHANZ 4MM 130MM (PIN) IMPLANT
SET HNDPC FAN SPRY TIP SCT (DISPOSABLE) ×1 IMPLANT
SPONGE GAUZE 4X4 12PLY (GAUZE/BANDAGES/DRESSINGS) ×2 IMPLANT
STAPLER VISISTAT 35W (STAPLE) ×2 IMPLANT
SUCTION FRAZIER TIP 10 FR DISP (SUCTIONS) ×2 IMPLANT
SUT BONE WAX W31G (SUTURE) ×2 IMPLANT
SUT ETHIBOND NAB CT1 #1 30IN (SUTURE) ×4 IMPLANT
SUT MNCRL AB 3-0 PS2 18 (SUTURE) ×2 IMPLANT
SUT PDS AB 0 CT 36 (SUTURE) ×4 IMPLANT
SUT PDS AB 1 CT  36 (SUTURE) ×3
SUT PDS AB 1 CT 36 (SUTURE) ×3 IMPLANT
SUT PDS AB 2-0 CT1 27 (SUTURE) ×4 IMPLANT
SUT VIC AB 0 CT1 27 (SUTURE) ×1
SUT VIC AB 0 CT1 27XBRD ANBCTR (SUTURE) ×1 IMPLANT
SUT VIC AB 1 CT1 27 (SUTURE) ×2
SUT VIC AB 1 CT1 27XBRD ANBCTR (SUTURE) ×2 IMPLANT
SYR CONTROL 10ML LL (SYRINGE) ×2 IMPLANT
TOWEL OR 17X24 6PK STRL BLUE (TOWEL DISPOSABLE) ×2 IMPLANT
TOWEL OR 17X26 10 PK STRL BLUE (TOWEL DISPOSABLE) ×2 IMPLANT
TRAY FOLEY CATH 14FR (SET/KITS/TRAYS/PACK) ×2 IMPLANT
WATER STERILE IRR 1000ML POUR (IV SOLUTION) ×6 IMPLANT

## 2012-07-07 NOTE — Progress Notes (Addendum)
ANTIBIOTIC CONSULT NOTE - Follow Up Consult  Pharmacy Consult: Vancomycin + Zosyn Indication:  Septic knee  Allergies  Allergen Reactions  . Demerol (Meperidine) Nausea And Vomiting  . Morphine And Related Nausea And Vomiting  . Oxycodone     Patient preference (History of drug abuse)    Patient Measurements: Height: 5' 6.14" (168 cm) Weight: 199 lb 15.3 oz (90.7 kg) IBW/kg (Calculated) : 64.13  Vital Signs: Temp: 98.7 F (37.1 C) (05/14 2142) Temp src: Oral (05/14 2142) BP: 128/70 mmHg (05/14 2142) Pulse Rate: 85 (05/14 2142)  Labs:  Recent Labs  07/04/12 1701 07/04/12 1720 07/05/12 0400 07/06/12 0545 07/06/12 1545  HGB 15.3 15.0  --   --  14.3  HCT 42.5 44.0  --   --  40.2  PLT 186  --   --   --  167  LABPROT  --   --  14.1 18.9*  --   INR  --   --  1.10 1.64*  --   CREATININE  --  0.70  --   --  0.90    Estimated Creatinine Clearance: 94.5 ml/min (by C-G formula based on Cr of 0.9).   Medical History: Past Medical History  Diagnosis Date  . Drug addict     Clean for 20 years  . Psoriatic arthritis     Assessment: 71 YOM with history of psoriatic arthritis s/p TKA in ~2002 presented with swelling, redness, and pain in his right knee.  Now s/p I&D and on D3 vancomycin and Zosyn for septic knee. Vancomycin trough (8.7 mcg/mL) is below-goal on 1gm IV Q8H. Vancomycin trough drawn ~ 9.5 hours after previous dose (~ 1.5 hours late).   Goal of Therapy:  Vanc trough 15-20 mcg/mL   Plan:  1. Change vancomycin dosing to 1.5 gm IV Q8H.  2. Continue Zosyn at current dose.    Lorre Munroe, PharmD 07/07/12, 00:20

## 2012-07-07 NOTE — Anesthesia Procedure Notes (Addendum)
Anesthesia Regional Block:  Femoral nerve block  Pre-Anesthetic Checklist: ,, timeout performed, Correct Patient, Correct Site, Correct Laterality, Correct Procedure, Correct Position, site marked, Risks and benefits discussed,  Surgical consent,  Pre-op evaluation,  At surgeon's request and post-op pain management  Laterality: Right  Prep: chloraprep       Needles:  Injection technique: Single-shot      Needle Gauge: 22 and 22 G    Additional Needles:  Procedures: nerve stimulator Femoral nerve block  Nerve Stimulator or Paresthesia:  Response: 0.48 mA,   Additional Responses:   Narrative:  Start time: 07/07/2012 7:07 AM End time: 07/07/2012 7:13 AM Injection made incrementally with aspirations every 5 mL. Anesthesiologist: Dr Gypsy Balsam  Additional Notes: [id number] R FNB POP CHG prep, sterile tech #[id number] stim needle w/stim down to .48ma Multiple neg asp Marc .5% w/epi 1:200000 total 25cc+decadron 4mg  infil No compl Dr Gypsy Balsam   Procedure Name: Intubation Date/Time: 07/07/2012 7:35 AM Performed by: Gayla Medicus Pre-anesthesia Checklist: Timeout performed, Patient identified, Emergency Drugs available, Suction available and Patient being monitored Patient Re-evaluated:Patient Re-evaluated prior to inductionOxygen Delivery Method: Circle system utilized Preoxygenation: Pre-oxygenation with 100% oxygen Intubation Type: IV induction Ventilation: Mask ventilation without difficulty Grade View: Grade I Tube type: Oral Tube size: 7.5 mm Number of attempts: 1 Airway Equipment and Method: Stylet and Video-laryngoscopy Placement Confirmation: ETT inserted through vocal cords under direct vision,  positive ETCO2 and breath sounds checked- equal and bilateral Secured at: 22 cm Tube secured with: Tape Dental Injury: Teeth and Oropharynx as per pre-operative assessment

## 2012-07-07 NOTE — H&P (Signed)
  The recent History & Physical has been reviewed. I have personally examined the patient today. There is no interval change to the documented History & Physical. The patient would like to proceed with the procedure.  [name] W 07/07/2012,  7:10 AM

## 2012-07-07 NOTE — Evaluation (Signed)
Physical Therapy Evaluation Patient Details Name: Roy [name] MRN: [id number] DOB: [birth year] Today's Date: 07/07/2012 Time: 1455-1520 PT Time Calculation (min): 25 min  PT Assessment / Plan / Recommendation Clinical Impression  Pt is a 57 y.o. male s/p R excisional TKA with antibiotic spacer. Presents with decreased mobility, decreased independence with gt/transfers and balance deficits. WIll benefit from skilled PT to maximize functional mobility. Plans to D/C with family 24/7 assitance when medically ready.    PT Assessment  Patient needs continued PT services    Follow Up Recommendations  Home health PT;Supervision/Assistance - 24 hour    Does the patient have the potential to tolerate intense rehabilitation      Barriers to Discharge        Equipment Recommendations  Rolling walker with 5" wheels    Recommendations for Other Services OT consult   Frequency 7X/week    Precautions / Restrictions Precautions Precautions: Fall;Knee Precaution Booklet Issued: Yes (comment) Precaution Comments: No flex  Required Braces or Orthoses: Knee Immobilizer - Right Knee Immobilizer - Right: On at all times Restrictions Weight Bearing Restrictions: Yes RLE Weight Bearing: Partial weight bearing RLE Partial Weight Bearing Percentage or Pounds: 50   Pertinent Vitals/Pain 4/10 at rest; pt repositioned in bed; premedicated.       Mobility  Bed Mobility Bed Mobility: Supine to Sit;Sitting - Scoot to Edge of Bed;Sit to Supine Supine to Sit: 4: Min assist;HOB elevated;With rails Sitting - Scoot to Edge of Bed: 4: Min guard Sit to Supine: 4: Min assist;HOB elevated;With rail Details for Bed Mobility Assistance: assistance needed to advance R LE to/off EOB; requires increased time due to dizziniess and pain; vc's for hand placement and sequencing  Transfers Transfers: Sit to Stand;Stand to Sit Sit to Stand: 1: +2 Total assist;From elevated surface;Without upper extremity  assist;From bed Sit to Stand: Patient Percentage: 40% Stand to Sit: 1: +2 Total assist;To elevated surface;To bed;With upper extremity assist Stand to Sit: Patient Percentage: 40% Details for Transfer Assistance: pt required increased assitance due to pain in R LE and decreased ablity to WB; pt very fearful and demo increased anxiety with transfer  Ambulation/Gait Ambulation/Gait Assistance: 3: Mod assist Ambulation Distance (Feet): 4 Feet (lateral steps) Assistive device: Rolling walker Ambulation/Gait Assistance Details: decreased awareness of using RW; prefers SW; assistance for gt and RW sequencing; decreased safety awareness; balance deficits due to PWB status on R LE and pain Gait Pattern: Step-to pattern;Trunk flexed Gait velocity: decreased Stairs: No Wheelchair Mobility Wheelchair Mobility: No    Exercises Total Joint Exercises Ankle Circles/Pumps: AROM;Both;10 reps   PT Diagnosis: Difficulty walking;Acute pain  PT Problem List: Decreased strength;Decreased range of motion;Decreased activity tolerance;Decreased balance;Decreased mobility;Decreased knowledge of use of DME;Decreased safety awareness;Pain PT Treatment Interventions: DME instruction;Gait training;Stair training;Functional mobility training;Therapeutic activities;Therapeutic exercise;Balance training;Neuromuscular re-education;Patient/family education   PT Goals Acute Rehab PT Goals PT Goal Formulation: With patient Time For Goal Achievement: 07/12/12 Potential to Achieve Goals: Good Pt will go Supine/Side to Sit: with modified independence PT Goal: Supine/Side to Sit - Progress: Goal set today Pt will go Sit to Supine/Side: with modified independence PT Goal: Sit to Supine/Side - Progress: Goal set today Pt will go Sit to Stand: with modified independence PT Goal: Sit to Stand - Progress: Goal set today Pt will Ambulate: >150 feet;with modified independence;with rolling walker PT Goal: Ambulate - Progress:  Goal set today Pt will Go Up / Down Stairs: 3-5 stairs;with supervision;with rail(s);with least restrictive assistive device PT Goal: Up/Down Stairs -  Progress: Goal set today Pt will Perform Home Exercise Program: with supervision, verbal cues required/provided PT Goal: Perform Home Exercise Program - Progress: Goal set today  Visit Information  Last PT Received On: 07/07/12    Subjective Data  Subjective: "I dont really want to get to the chair; but we can do whatever else"  Patient Stated Goal: home with family   Prior Functioning  Home Living Lives With: Alone Available Help at Discharge: Family;Available 24 hours/day Type of Home: House Home Access: Stairs to enter [employer] of Steps: 3 (has ramp if stays at his parents; at sisters has 3) Entrance Stairs-Rails: Left Home Layout: One level Bathroom Shower/Tub: Tub/shower unit (is checking with wife to see where his shower bench is) Firefighter:  (has 3 in 1 commode) Bathroom Accessibility: Yes How Accessible: Accessible via walker Home Adaptive Equipment: Bedside commode/3-in-1;Straight cane;Walker - standard Additional Comments: is checking about shower bench Prior Function Level of Independence: Independent with assistive device(s) (uses SPC only PRN; on bad or snow days) Able to Take Stairs?: Yes Driving: Yes Vocation: Full time employment Communication Communication: No difficulties Dominant Hand: Right    Cognition  Cognition Arousal/Alertness: Awake/alert Behavior During Therapy: WFL for tasks assessed/performed Overall Cognitive Status: Within Functional Limits for tasks assessed    Extremity/Trunk Assessment Right Lower Extremity Assessment RLE ROM/Strength/Tone: Deficits;Unable to fully assess;Due to pain;Due to precautions RLE ROM/Strength/Tone Deficits: unable to assess knee; DF/PF [facility] RLE Sensation: Deficits RLE Sensation Deficits: diminshed to light touch vs L LE Left Lower Extremity  Assessment LLE ROM/Strength/Tone: WFL for tasks assessed LLE Sensation: WFL - Light Touch Trunk Assessment Trunk Assessment: Normal   Balance Balance Balance Assessed: Yes Static Sitting Balance Static Sitting - Balance Support: Bilateral upper extremity supported;Feet supported Static Sitting - Level of Assistance: 5: Stand by assistance Static Sitting - Comment/# of Minutes: tolerated sitting EOB ~5 until dizziness subsided  End of Session PT - End of Session Equipment Utilized During Treatment: Gait belt;Right knee immobilizer Activity Tolerance: Patient limited by pain;Other (comment) (limited by dizziness) Patient left: in bed;with call bell/phone within reach Nurse Communication: Mobility status;Precautions  GP     Donell Sievert, [place] [phone] 07/07/2012, 3:49 PM

## 2012-07-07 NOTE — Progress Notes (Signed)
Orthopedic Tech Progress Note Patient Details:  Roy [name] [birth year] [id number] OHF applied to bed Patient ID: Roy [name], male   DOB: 25-Nov-1955, 57 y.o.   MRN: [id number]   Orie Rout 07/07/2012, 1:41 PM

## 2012-07-07 NOTE — Transfer of Care (Signed)
Immediate Anesthesia Transfer of Care Note  Patient: Roy [name]  Procedure(s) Performed: Procedure(s) with comments: EXCISIONAL TOTAL KNEE ARTHROPLASTY WITH ANTIBIOTIC SPACERS (Right) - Irrigation and Debridement Right Total Knee Replacement, Insertion of ABX Spacer  Patient Location: PACU  Anesthesia Type:General  Level of Consciousness: awake, alert  and oriented  Airway & Oxygen Therapy: Patient Spontanous Breathing and Patient connected to nasal cannula oxygen  Post-op Assessment: Report given to PACU RN, Post -op Vital signs reviewed and stable and Patient moving all extremities X 4  Post vital signs: Reviewed and stable  Complications: No apparent anesthesia complications

## 2012-07-07 NOTE — Consult Note (Signed)
INFECTIOUS DISEASE CONSULT NOTE  Date of Admission:  07/04/2012  Date of Consult:  07/07/2012  Reason for Consult: Osteomyelitis, infected TKR Referring Physician: Cleophas Dunker  Impression/Recommendation Osteomyelitis, Septic Arthritis, infected TKR Hx of drug abuse  Would Stop zosyn, vanco Start ancef Check Hepatitis serologies, HIV Place PIC  Comment- since prosthetic is out, will not give him rifampin.   Thank you so much for this interesting consult,   Johny Sax [phone]  Roy [name] is an 57 y.o. male.  HPI: 57 yo M with hx of psoriatic arthritis (on enbrel and MTX) and previous R TKR in 2002. Adm on 5-12 with acute onset of swelling of his R knee, he underwent arthrocentesis showing 100,000 WBC and GPC in pairs. Cx grew MSSA. He underwent I & D of TKR on 5-12. He developed fever post-operatively (102.4 on 5-14). Today he underwent removal of TKR with placement of anbx spacer.   Past Medical History  Diagnosis Date  . Drug addict     Clean for 20 years  . Psoriatic arthritis     Past Surgical History  Procedure Laterality Date  . Knee surgery    . Total hip arthroplasty    . Orif tibia fracture    . Knee arthroscopy Right 07/04/2012    Procedure: ARTHROSCOPY I&D KNEE;  Surgeon: Nadara Mustard, MD;  Location: [facility] OR;  Service: Orthopedics;  Laterality: Right;     Allergies  Allergen Reactions  . Demerol (Meperidine) Nausea And Vomiting  . Morphine And Related Nausea And Vomiting  . Oxycodone     Patient preference (History of drug abuse)    Medications:  Scheduled: . acetaminophen  1,000 mg Intravenous Q6H  . docusate sodium  100 mg Oral BID  . HYDROmorphone      . HYDROmorphone      . ketorolac  15 mg Intravenous Q6H  . piperacillin-tazobactam (ZOSYN)  IV  3.375 g Intravenous Q8H  . rivaroxaban  10 mg Oral Once  . [START ON 07/08/2012] rivaroxaban  10 mg Oral Q supper  . vancomycin  1,500 mg Intravenous Q8H    Total days of antibiotics: 4  vanco/zosyn         Social History:  reports that he quit smoking about 20 years ago. His smoking use included Cigarettes. He smoked 0.00 packs per day. He does not have any smokeless tobacco history on file. He reports that he does not use illicit drugs. His alcohol history is not on file.  No family history on file.  General ROS: normal BM, normal urination, no recent cellulitis, L elbow frozen, see HPI.   Blood pressure 157/85, pulse 78, temperature 98.9 F (37.2 C), temperature source Oral, resp. rate 18, height 5' 6.14" (1.68 m), weight 90.7 kg (199 lb 15.3 oz), SpO2 96.00%. General appearance: alert, cooperative and no distress Eyes: negative findings: pupils equal, round, reactive to light and accomodation Throat: normal findings: oropharynx pink & moist without lesions or evidence of thrush Neck: no adenopathy and supple, symmetrical, trachea midline Lungs: clear to auscultation bilaterally Heart: regular rate and rhythm Abdomen: normal findings: bowel sounds normal and soft, non-tender Extremities: LLE wrapped.  Skin: psoriatic lesions on arms Neurologic: Sensory: normal light touch BLE, grossly   Results for orders placed during the hospital encounter of 07/04/12 (from the past 48 hour(s))  PROTIME-INR     Status: Abnormal   Collection Time    07/06/12  5:45 AM      Result Value Range  Prothrombin Time 18.9 (*) 11.6 - 15.2 seconds   INR 1.64 (*) 0.00 - 1.49  CBC     Status: None   Collection Time    07/06/12  3:45 PM      Result Value Range   WBC 6.8  4.0 - 10.5 K/uL   RBC 4.91  4.22 - 5.81 MIL/uL   Hemoglobin 14.3  13.0 - 17.0 g/dL   HCT 16.1  09.6 - 04.5 %   MCV 81.9  78.0 - 100.0 fL   MCH 29.1  26.0 - 34.0 pg   MCHC 35.6  30.0 - 36.0 g/dL   RDW 40.9  81.1 - 91.4 %   Platelets 167  150 - 400 K/uL  COMPREHENSIVE METABOLIC PANEL     Status: Abnormal   Collection Time    07/06/12  3:45 PM      Result Value Range   Sodium 135  135 - 145 mEq/L   Potassium 3.4  (*) 3.5 - 5.1 mEq/L   Chloride 99  96 - 112 mEq/L   CO2 28  19 - 32 mEq/L   Glucose, Bld 120 (*) 70 - 99 mg/dL   BUN 10  6 - 23 mg/dL   Creatinine, Ser 7.82  0.50 - 1.35 mg/dL   Calcium 8.7  8.4 - 95.6 mg/dL   Total Protein 6.5  6.0 - 8.3 g/dL   Albumin 2.3 (*) 3.5 - 5.2 g/dL   AST 31  0 - 37 U/L   ALT 27  0 - 53 U/L   Alkaline Phosphatase 65  39 - 117 U/L   Total Bilirubin 0.4  0.3 - 1.2 mg/dL   GFR calc non Af Amer >90  >90 mL/min   GFR calc Af Amer >90  >90 mL/min   Comment:            The eGFR has been calculated     using the CKD EPI equation.     This calculation has not been     validated in all clinical     situations.     eGFR's persistently     <90 mL/min signify     possible Chronic Kidney Disease.  TYPE AND SCREEN     Status: None   Collection Time    07/06/12  3:45 PM      Result Value Range   ABO/RH(D) B POS     Antibody Screen NEG     Sample Expiration 07/09/2012    VANCOMYCIN, TROUGH     Status: Abnormal   Collection Time    07/06/12 10:52 PM      Result Value Range   Vancomycin Tr 8.7 (*) 10.0 - 20.0 ug/mL  APTT     Status: Abnormal   Collection Time    07/07/12  5:18 AM      Result Value Range   aPTT 42 (*) 24 - 37 seconds   Comment:            IF BASELINE aPTT IS ELEVATED,     SUGGEST PATIENT RISK ASSESSMENT     BE USED TO DETERMINE APPROPRIATE     ANTICOAGULANT THERAPY.  PROTIME-INR     Status: Abnormal   Collection Time    07/07/12  5:18 AM      Result Value Range   Prothrombin Time 20.1 (*) 11.6 - 15.2 seconds   INR 1.78 (*) 0.00 - 1.49      Component Value Date/Time   SDES SYNOVIAL FLUID  RIGHT KNEE 07/04/2012 1825   SPECREQUEST NONE 07/04/2012 1825   CULT  Value: ABUNDANT STAPHYLOCOCCUS AUREUS Note: RIFAMPIN AND GENTAMICIN SHOULD NOT BE USED AS SINGLE DRUGS FOR TREATMENT OF STAPH INFECTIONS. 07/04/2012 1824   REPTSTATUS 07/04/2012 FINAL 07/04/2012 1825   No results found. Recent Results (from the past 240 hour(s))  BODY FLUID CULTURE      Status: None   Collection Time    07/04/12  6:24 PM      Result Value Range Status   Specimen Description SYNOVIAL FLUID RIGHT KNEE   Final   Special Requests NONE   Final   Gram Stain     Final   Value: ABUNDANT WBC PRESENT, PREDOMINANTLY PMN     ABUNDANT GRAM POSITIVE COCCI     IN PAIRS   Culture     Final   Value: ABUNDANT STAPHYLOCOCCUS AUREUS     Note: RIFAMPIN AND GENTAMICIN SHOULD NOT BE USED AS SINGLE DRUGS FOR TREATMENT OF STAPH INFECTIONS.   Report Status 07/07/2012 FINAL   Final   Organism ID, Bacteria STAPHYLOCOCCUS AUREUS   Final  GRAM STAIN     Status: None   Collection Time    07/04/12  6:25 PM      Result Value Range Status   Specimen Description SYNOVIAL FLUID RIGHT KNEE   Final   Special Requests NONE   Final   Gram Stain     Final   Value: ABUNDANT WBC PRESENT, PREDOMINANTLY PMN     ABUNDANT GRAM POSITIVE COCCI IN PAIRS     Gram Stain Report Called to,Read Back By and Verified With: RN J. CAMP 1944 07/04/12 Riki Rusk.   Report Status 07/04/2012 FINAL   Final  MRSA PCR SCREENING     Status: None   Collection Time    07/05/12  1:01 AM      Result Value Range Status   MRSA by PCR NEGATIVE  NEGATIVE Final   Comment:            The GeneXpert MRSA Assay (FDA     approved for NASAL specimens     only), is one component of a     comprehensive MRSA colonization     surveillance program. It is not     intended to diagnose MRSA     infection nor to guide or     monitor treatment for     MRSA infections.      07/07/2012, 5:09 PM     LOS: 3 days

## 2012-07-07 NOTE — Op Note (Signed)
PATIENT ID:      Roy [name]  MRN:     [id number] DOB/AGE:    59-May-1957 / 59 y.o.       OPERATIVE REPORT    DATE OF PROCEDURE:  07/07/2012       PREOPERATIVE DIAGNOSIS:   Infected Right Total Knee Replacement                                                       Estimated body mass index is 32.14 kg/(m^2) as calculated from the following:   Height as of this encounter: 5' 6.14" (1.68 m).   Weight as of this encounter: 90.7 kg (199 lb 15.3 oz).     POSTOPERATIVE DIAGNOSIS:   Infected Right Total Knee Replacement                                                                     Estimated body mass index is 32.14 kg/(m^2) as calculated from the following:   Height as of this encounter: 5' 6.14" (1.68 m).   Weight as of this encounter: 90.7 kg (199 lb 15.3 oz).     PROCEDURE:  Procedure(s): EXCISIONAL TOTAL KNEE ARTHROPLASTY WITH ANTIBIOTIC SPACERS right     SURGEON:  [name], MD    ASSISTANT:   [name], PA-C   (Present and scrubbed throughout the case, critical for assistance with exposure, retraction, instrumentation, and closure.)          ANESTHESIA: regional and general     DRAINS: (right knee) Hemovact drain(s) in the open with  Suction Open :      TOURNIQUET TIME: * Missing tourniquet times found for documented tourniquets in log:  [id number] * 99 minutes   COMPLICATIONS:  None   CONDITION:  stable  PROCEDURE IN [id number]   [name] W 07/07/2012, 9:54 AM

## 2012-07-07 NOTE — Anesthesia Preprocedure Evaluation (Signed)
Anesthesia Evaluation  Patient identified by MRN, date of birth, ID band Patient awake    Reviewed: Allergy & Precautions, H&P , NPO status , Patient's Chart, lab work & pertinent test results  Airway Mallampati: II TM Distance: >3 FB     Dental   Pulmonary  breath sounds clear to auscultation        Cardiovascular Rhythm:Regular Rate:Normal     Neuro/Psych    GI/Hepatic   Endo/Other    Renal/GU      Musculoskeletal   Abdominal (+) + obese,   Peds  Hematology   Anesthesia Other Findings   Reproductive/Obstetrics                           Anesthesia Physical Anesthesia Plan  ASA: II  Anesthesia Plan: General   Post-op Pain Management:    Induction: Intravenous  Airway Management Planned: Oral ETT  Additional Equipment:   Intra-op Plan:   Post-operative Plan: Extubation in OR  Informed Consent: I have reviewed the patients History and Physical, chart, labs and discussed the procedure including the risks, benefits and alternatives for the proposed anesthesia with the patient or authorized representative who has indicated his/her understanding and acceptance.     Plan Discussed with: CRNA and Surgeon  Anesthesia Plan Comments:         Anesthesia Quick Evaluation

## 2012-07-07 NOTE — Anesthesia Postprocedure Evaluation (Signed)
  Anesthesia Post-op Note  Patient: Roy [name]  Procedure(s) Performed: Procedure(s) with comments: EXCISIONAL TOTAL KNEE ARTHROPLASTY WITH ANTIBIOTIC SPACERS (Right) - Irrigation and Debridement Right Total Knee Replacement, Insertion of ABX Spacer  Patient Location: PACU  Anesthesia Type:GA combined with regional for post-op pain  Level of Consciousness: awake  Airway and Oxygen Therapy: Patient Spontanous Breathing  Post-op Pain: mild  Post-op Assessment: Post-op Vital signs reviewed, Patient's Cardiovascular Status Stable, Respiratory Function Stable, Patent Airway, No signs of Nausea or vomiting and Pain level controlled  Post-op Vital Signs: stable  Complications: No apparent anesthesia complications

## 2012-07-08 ENCOUNTER — Encounter (HOSPITAL_COMMUNITY): Payer: Self-pay | Admitting: Orthopaedic Surgery

## 2012-07-08 DIAGNOSIS — T8450XA Infection and inflammatory reaction due to unspecified internal joint prosthesis, initial encounter: Principal | ICD-10-CM

## 2012-07-08 DIAGNOSIS — Y849 Medical procedure, unspecified as the cause of abnormal reaction of the patient, or of later complication, without mention of misadventure at the time of the procedure: Secondary | ICD-10-CM

## 2012-07-08 DIAGNOSIS — A4901 Methicillin susceptible Staphylococcus aureus infection, unspecified site: Secondary | ICD-10-CM

## 2012-07-08 LAB — BASIC METABOLIC PANEL
BUN: 12 mg/dL (ref 6–23)
CO2: 27 mEq/L (ref 19–32)
Calcium: 8.5 mg/dL (ref 8.4–10.5)
Chloride: 100 mEq/L (ref 96–112)
Creatinine, Ser: 0.73 mg/dL (ref 0.50–1.35)
GFR calc Af Amer: >90 mL/min (ref >90)
GFR calc non Af Amer: >90 mL/min (ref >90)
Glucose, Bld: 124 mg/dL — ABNORMAL HIGH (ref 70–99)
Potassium: 4.2 mEq/L (ref 3.5–5.1)
Sodium: 135 mEq/L (ref 135–145)

## 2012-07-08 LAB — HEPATITIS PANEL, ACUTE
HCV Ab: NEGATIVE
Hep A IgM: NEGATIVE
Hep B C IgM: NEGATIVE
Hepatitis B Surface Ag: NEGATIVE

## 2012-07-08 LAB — CBC
HCT: 34.2 % — ABNORMAL LOW (ref 39.0–52.0)
Hemoglobin: 12 g/dL — ABNORMAL LOW (ref 13.0–17.0)
MCH: 28.5 pg (ref 26.0–34.0)
MCHC: 35.1 g/dL (ref 30.0–36.0)
MCV: 81.2 fL (ref 78.0–100.0)
Platelets: 155 10*3/uL (ref 150–400)
RBC: 4.21 MIL/uL — ABNORMAL LOW (ref 4.22–5.81)
RDW: 14.7 % (ref 11.5–15.5)
WBC: 8.7 10*3/uL (ref 4.0–10.5)

## 2012-07-08 LAB — GLUCOSE, CAPILLARY
Glucose-Capillary: 104 mg/dL — ABNORMAL HIGH (ref 70–99)
Glucose-Capillary: 109 mg/dL — ABNORMAL HIGH (ref 70–99)
Glucose-Capillary: 93 mg/dL (ref 70–99)

## 2012-07-08 LAB — HIV ANTIBODY (ROUTINE TESTING W REFLEX): HIV: NONREACTIVE

## 2012-07-08 MED ORDER — ACETAMINOPHEN 10 MG/ML IV SOLN: 1000.0000 mg | Freq: Once | INTRAVENOUS | Status: DC

## 2012-07-08 MED ORDER — HYDROMORPHONE HCL PF 1 MG/ML IJ SOLN
0.5000 mg | INTRAMUSCULAR | Status: DC | PRN
  Administered 2012-07-08 – 2012-07-09 (×2): 0.5 mg via INTRAVENOUS
  Filled 2012-07-08 (×2): qty 1

## 2012-07-08 MED ORDER — CEFAZOLIN SODIUM-DEXTROSE 2-3 GM-% IV SOLR
2.0000 g | Freq: Three times a day (TID) | INTRAVENOUS | Status: DC
  Administered 2012-07-08 – 2012-07-12 (×13): 2 g via INTRAVENOUS
  Filled 2012-07-08 (×16): qty 50

## 2012-07-08 MED ORDER — SODIUM CHLORIDE 0.9 % IJ SOLN
10.0000 mL | INTRAMUSCULAR | Status: DC | PRN
  Administered 2012-07-12: 10 mL

## 2012-07-08 MED FILL — Thrombin For Soln Kit 20000 Unit: CUTANEOUS | Qty: 1 | Status: AC

## 2012-07-08 NOTE — Consult Note (Signed)
ANTIBIOTIC CONSULT NOTE - INITIAL  Pharmacy Consult for : Ancef Indication: PJI, s/pj removal of components, MSSA  Microbiology: Culture + for MSSA   Medical History: Past Medical History  Diagnosis Date  . Drug addict     Clean for 20 years  . Psoriatic arthritis     Past Surgical History  Procedure Laterality Date  . Knee surgery    . Total hip arthroplasty    . Orif tibia fracture    . 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 On Ancef. Will need 6 weeks through June 26th  CrCl > 100, no adjustments of Ancef required.  ID ordered Ancef 2 gm IV q 8 hours x 6 wks total.  [Currently on Ancef 1 gm IV q 8 hrs]  Goal of Therapy:   Dosing of Ancef per protocol.  No renal adjustments required.  Plan:   Change Ancef to 2 gm IV q 8 hours.  Pharmacy will sign off at this time, please call for further questions  Laurena Bering,  Pharm.D.  07/08/2012 11:53 AM

## 2012-07-08 NOTE — Progress Notes (Signed)
Patient ID: Roy [name], male   DOB: 05/17/1955, 57 y.o.   MRN: [id number] PATIENT ID: Roy [name]        MRN:  [id number]          DOB/AGE: April 20, 1955 / 57 y.o.    Roy Campbell, MD   Roy Code, PA-C [address] [place], [place]  [address]                             [phone]   PROGRESS NOTE  Subjective:  negative for Chest Pain  negative for Shortness of Breath  negative for Nausea/Vomiting   negative for Calf Pain    Tolerating Diet: yes         Patient reports pain as 5 on 0-10 scale.     Restless sleep  Objective: Vital signs in last 24 hours:   Patient Vitals for the past 24 hrs:  BP Temp Temp src Pulse Resp SpO2  07/08/12 0500 123/78 mmHg 98.4 F (36.9 C) Oral 71 18 99 %  07/08/12 0027 128/65 mmHg 97.5 F (36.4 C) Oral 67 18 98 %  07/07/12 2042 119/63 mmHg 98.1 F (36.7 C) Oral 67 18 97 %  07/07/12 1600 117/71 mmHg 97.6 F (36.4 C) - 71 18 96 %  07/07/12 1222 157/85 mmHg 98.9 F (37.2 C) - 78 18 96 %  07/07/12 1204 - - - 75 19 96 %  07/07/12 1201 142/77 mmHg - - 60 17 96 %  07/07/12 1200 - 99 F (37.2 C) - - - -  07/07/12 1152 153/85 mmHg - - 79 18 95 %  07/07/12 1145 - - - 75 19 96 %  07/07/12 1137 147/80 mmHg - - 69 19 96 %  07/07/12 1130 - - - 66 18 95 %  07/07/12 1122 154/77 mmHg - - 64 17 96 %  07/07/12 1119 151/76 mmHg - - 65 18 95 %  07/07/12 1115 151/76 mmHg - - 75 18 95 %  07/07/12 1107 148/79 mmHg - - 79 21 95 %  07/07/12 1104 - - - 85 21 97 %  07/07/12 1100 - - - 70 21 97 %  07/07/12 1055 - - - 83 21 97 %  07/07/12 1052 155/93 mmHg - - 84 20 98 %  07/07/12 1050 - - - 76 19 97 %  07/07/12 1045 - - - 77 19 97 %  07/07/12 1036 - - - 82 27 97 %  07/07/12 1035 162/91 mmHg - - 93 25 96 %  07/07/12 1030 - - - 89 25 95 %  07/07/12 1020 149/98 mmHg 99.4 F (37.4 C) - 84 22 94 %      Intake/Output from previous day:   05/15 0701 - 05/16 0700 In: 1920 [P.O.:720; I.V.:1200] Out: 2780 [Urine:2375; Drains:405]     Intake/Output this shift:       Intake/Output     05/15 0701 - 05/16 0700 05/16 0701 - 05/17 0700   P.O. 720    I.V. (mL/kg) 1200 (13.2)    Total Intake(mL/kg) 1920 (21.2)    Urine (mL/kg/hr) 2375 (1.1)    Drains 405 (0.2)    Total Output 2780     Net -860             LABORATORY DATA:  Recent Labs  07/04/12 1701 07/04/12 1720 07/06/12 1545 07/08/12 0435  WBC 14.6*  --  6.8 8.7  HGB 15.3  15.0 14.3 12.0*  HCT 42.5 44.0 40.2 34.2*  PLT 186  --  167 155    Recent Labs  07/04/12 1720 07/06/12 1545 07/08/12 0435  NA 139 135 135  K 3.5 3.4* 4.2  CL 106 99 100  CO2  --  28 27  BUN 16 10 12   CREATININE 0.70 0.90 0.73  GLUCOSE 113* 120* 124*  CALCIUM  --  8.7 8.5   Lab Results  Component Value Date   INR 1.78* 07/07/2012   INR 1.64* 07/06/2012   INR 1.10 07/05/2012    Recent Radiographic Studies :  Dg Knee 1-2 Views Right  07/03/2012   *RADIOLOGY REPORT*  Clinical Data: Anterior right knee pain.  RIGHT KNEE - 1-2 VIEW  Comparison: None.  Findings: Cross-table lateral view demonstrates a moderate sized knee effusion.  Three-part total knee arthroplasty is present. There are is no osteolysis along the tibial tray medially and laterally, suggesting early loosening and / or particle disease. Similar changes are present around the femoral component.  IMPRESSION:  1.  No periprosthetic fracture. 2.  Moderate effusion. 3.  Lucency around the tibial tray and femoral components suggests early loosening which may be aseptic, particle disease or infection.   Original Report Authenticated By: Roy [name], M.D.   Dg Chest Port 1 View  07/04/2012   *RADIOLOGY REPORT*  Clinical Data: Postop, shortness of breath  PORTABLE CHEST - 1 VIEW  Comparison: 05/07/2011  Findings: Patchy airspace opacity left lung.  Hypoaeration with interstitial and vascular crowding.  Cardiomediastinal prominence is likely accentuated by hypoaeration.  No pneumothorax or definite pleural effusion.  Multilevel  degenerative changes.  IMPRESSION: Patchy airspace opacity left lung may reflect asymmetric edema, pneumonia, or aspiration in the appropriate clinical setting.   Original Report Authenticated By: Roy [name], M.D.     Examination:  General appearance: alert, cooperative and no distress  Wound Exam: clean, dry, intact   Drainage:  None: wound tissue dry  Motor Exam: EHL, FHL, Anterior Tibial and Posterior Tibial   Sensory Exam: some patchy decreased sensibility on dorsum of right foot-motors intact-will release knee immobilizer and ice pack  Vascular Exam: Normal  Assessment:    1 Day Post-Op  Procedure(s) (LRB): EXCISIONAL TOTAL KNEE ARTHROPLASTY WITH ANTIBIOTIC SPACERS (Right)  ADDITIONAL DIAGNOSIS:  Principal Problem:   Infection of total right knee replacement Active Problems:   Psoriatic arthritis, destructive type  no new problems   Plan: Physical Therapy as ordered Partial Weight Bearing @ 50% (PWB)  DVT Prophylaxis:  Xarelto  DISCHARGE PLAN: Home  DISCHARGE NEEDS: HHPT    OOB with PT, IV's per ID, D/C over weekend if stable     [facility], Roy [name] W 07/08/2012 7:42 AM

## 2012-07-08 NOTE — Evaluation (Signed)
Occupational Therapy Evaluation Patient Details Name: Roy [name] MRN: [id number] DOB: 12/29/1955 Today's Date: 07/08/2012 Time: 8657-8469 OT Time Calculation (min): 24 min  OT Assessment / Plan / Recommendation Clinical Impression    Pt is a 57 y.o. male s/p R excisional TKA with antibiotic spacer. Presents with below problem list.. WIll benefit from skilled OT to maximize functional independence prior to d/c. Pt overall Max A for LB ADLs. Plans to D/C with family 24/7 assitance when medically ready.     OT Assessment  Patient needs continued OT Services    Follow Up Recommendations  Home health OT;Supervision/Assistance - 24 hour    Barriers to Discharge      Equipment Recommendations  Other (comment) (TBD)    Recommendations for Other Services    Frequency  Min 2X/week    Precautions / Restrictions Precautions Precautions: Fall;Knee Precaution Comments: No flex  Required Braces or Orthoses: Knee Immobilizer - Right Restrictions Weight Bearing Restrictions: Yes RLE Weight Bearing: Partial weight bearing RLE Partial Weight Bearing Percentage or Pounds: 50   Pertinent Vitals/Pain Pain not reported, but evident with mobilization. Repositioned.     ADL  Eating/Feeding: Independent Where Assessed - Eating/Feeding: Chair Grooming: Set up Where Assessed - Grooming: Supported sitting Upper Body Bathing: Set up Where Assessed - Upper Body Bathing: Supported sitting Lower Body Bathing: Maximal assistance Where Assessed - Lower Body Bathing: Supported sit to stand Upper Body Dressing: Set up Where Assessed - Upper Body Dressing: Supported sitting Lower Body Dressing: Maximal assistance Where Assessed - Lower Body Dressing: Supported sit to Pharmacist, hospital: Chief of Staff: Patient Percentage: 60% Statistician Method: Surveyor, minerals: Other (comment) (from elevated bed) Tub/Shower Transfer Method: Not  assessed Equipment Used: Gait belt;Knee Immobilizer;Standard walker ADL Comments: Pt reports pain in Lt knee and can't reach foot to don/doff sock. Pt at overall Max A level for LB ADLs.     OT Diagnosis: Acute pain  OT Problem List: Decreased strength;Decreased range of motion;Impaired balance (sitting and/or standing);Decreased activity tolerance;Decreased knowledge of use of DME or AE;Decreased knowledge of precautions;Pain OT Treatment Interventions: Self-care/ADL training;DME and/or AE instruction;Therapeutic activities;Patient/family education;Balance training   OT Goals Acute Rehab OT Goals OT Goal Formulation: With patient Time For Goal Achievement: 07/15/12 Potential to Achieve Goals: Good ADL Goals Pt Will Perform Lower Body Bathing: with supervision;Sit to stand from chair ADL Goal: Lower Body Bathing - Progress: Goal set today Pt Will Perform Lower Body Dressing: with supervision;Sit to stand from bed;Sit to stand from chair ADL Goal: Lower Body Dressing - Progress: Goal set today Pt Will Transfer to Toilet: Ambulation;with DME;with modified independence ADL Goal: Toilet Transfer - Progress: Goal set today Pt Will Perform Toileting - Clothing Manipulation: with supervision;Standing ADL Goal: Toileting - Clothing Manipulation - Progress: Goal set today Pt Will Perform Toileting - Hygiene: with supervision;Sit to stand from 3-in-1/toilet;Sitting on 3-in-1 or toilet ADL Goal: Toileting - Hygiene - Progress: Goal set today Pt Will Perform Tub/Shower Transfer: Tub transfer;with supervision;Ambulation;with DME ADL Goal: Tub/Shower Transfer - Progress: Goal set today  Visit Information  Last OT Received On: 07/08/12 Assistance Needed: +2 PT/OT Co-Evaluation/Treatment: Yes    Subjective Data      Prior Functioning     Home Living Lives With: Alone Available Help at Discharge: Family;Available 24 hours/day Type of Home: House Home Access: Stairs to enter [employer] of Steps: 3 (has ramp if stays at his parents; at sisters has 3) Entrance Stairs-Rails: Left  Home Layout: One level Bathroom Shower/Tub: Tub/shower unit (checking with wife to see where shower bench is) Firefighter:  (has 3 in 1) Bathroom Accessibility: Yes How Accessible: Accessible via walker Home Adaptive Equipment: Bedside commode/3-in-1;Straight cane;Walker - standard Additional Comments: is checking about shower bench Prior Function Level of Independence: Independent with assistive device(s) (uses SPC only PRN; on bad or snow days) Able to Take Stairs?: Yes Driving: Yes Vocation: Full time employment Communication Communication: No difficulties Dominant Hand: Right         Vision/Perception     Cognition  Cognition Arousal/Alertness: Awake/alert Behavior During Therapy: WFL for tasks assessed/performed Overall Cognitive Status: Within Functional Limits for tasks assessed    Extremity/Trunk Assessment Right Upper Extremity Assessment RUE ROM/Strength/Tone: [facility] for tasks assessed Left Upper Extremity Assessment LUE ROM/Strength/Tone: WFL for tasks assessed     Mobility Bed Mobility Bed Mobility: Supine to Sit;Sitting - Scoot to Edge of Bed Supine to Sit: 4: Min assist;HOB elevated;With rails Sitting - Scoot to Edge of Bed: 4: Min guard Details for Bed Mobility Assistance: assistance needed to advance R LE to/off EOB; requires increased time due to dizziniess and pain; vc's for hand placement and sequencing  Transfers Transfers: Sit to Stand;Stand to Sit Sit to Stand: 1: +2 Total assist;From elevated surface;From bed Sit to Stand: Patient Percentage: 40% Stand to Sit: 1: +2 Total assist;To elevated surface;To bed;With upper extremity assist Stand to Sit: Patient Percentage: 40% Details for Transfer Assistance: pt required increased assitance due to pain in R LE and decreased ablity to WB; pt very fearful and demo increased anxiety with transfer.  Patient with little flexion of L knee as well limiting stand assistance. Increased height of bed for transfer     Exercise     Balance     End of Session OT - End of Session Equipment Utilized During Treatment: Gait belt;Right knee immobilizer Activity Tolerance: Patient limited by pain Patient left: in chair;with call bell/phone within reach  [employer] OTR/L [phone] 07/08/2012, 4:04 PM

## 2012-07-08 NOTE — Progress Notes (Signed)
PT is recommending home with HH and not SNF. CSW will make CM aware. Clinical Social Worker will sign off for now as social work intervention is no longer needed. Please consult us again if new need arises.   Micaiah Litle, MSW [phone] 

## 2012-07-08 NOTE — Progress Notes (Signed)
Physical Therapy Treatment Patient Details Name: Roy [name] MRN: [id number] DOB: September 20, 1955 Today's Date: 07/08/2012 Time: 1415-1440 PT Time Calculation (min): 25 min  PT Assessment / Plan / Recommendation Comments on Treatment Session  Patient s/p R knee infection. Increased pain this session. Unable to ambulation. Conitinue with current POC. Plans to DC on monday. Will need to increase mobility to safely return home    Follow Up Recommendations  Home health PT;Supervision/Assistance - 24 hour     Does the patient have the potential to tolerate intense rehabilitation     Barriers to Discharge        Equipment Recommendations  Rolling walker with 5" wheels    Recommendations for Other Services    Frequency 7X/week   Plan Discharge plan remains appropriate;Frequency remains appropriate    Precautions / Restrictions Precautions Precautions: Fall;Knee Precaution Comments: No flex  Required Braces or Orthoses: Knee Immobilizer - Right Restrictions RLE Weight Bearing: Partial weight bearing RLE Partial Weight Bearing Percentage or Pounds: 50   Pertinent Vitals/Pain     Mobility  Bed Mobility Supine to Sit: 4: Min assist;HOB elevated;With rails Sitting - Scoot to Edge of Bed: 4: Min guard Details for Bed Mobility Assistance: assistance needed to advance R LE to/off EOB; requires increased time due to dizziniess and pain; vc's for hand placement and sequencing  Transfers Transfers: Stand Pivot Transfers Sit to Stand: 1: +2 Total assist;From elevated surface;Without upper extremity assist;From bed Sit to Stand: Patient Percentage: 40% Stand to Sit: 1: +2 Total assist;To elevated surface;To bed;With upper extremity assist Stand to Sit: Patient Percentage: 40% Stand Pivot Transfers: 1: +2 Total assist Stand Pivot Transfers: Patient Percentage: 60% Details for Transfer Assistance: pt required increased assitance due to pain in R LE and decreased ablity to WB; pt very  fearful and demo increased anxiety with transfer. Patient with little flexion of L knee as well limiting stand assistance. Increased height of bed for transfer Ambulation/Gait Ambulation/Gait Assistance: Not tested (comment)    Exercises     PT Diagnosis:    PT Problem List:   PT Treatment Interventions:     PT Goals Acute Rehab PT Goals PT Goal: Supine/Side to Sit - Progress: Progressing toward goal PT Goal: Sit to Stand - Progress: Progressing toward goal PT Goal: Ambulate - Progress: Not progressing  Visit Information  Last PT Received On: 07/08/12 Assistance Needed: +2    Subjective Data      Cognition  Cognition Arousal/Alertness: Awake/alert Behavior During Therapy: WFL for tasks assessed/performed Overall Cognitive Status: Within Functional Limits for tasks assessed    Balance     End of Session PT - End of Session Equipment Utilized During Treatment: Gait belt;Right knee immobilizer Activity Tolerance: Patient limited by pain;Other (comment) Patient left: in chair;with call bell/phone within reach Nurse Communication: Mobility status   GP     Fredrich Birks 07/08/2012, 2:51 PM 07/08/2012 Fredrich Birks PTA [phone] pager [phone] office

## 2012-07-08 NOTE — Op Note (Signed)
[name], [name] NO.:  [id number]  MEDICAL RECORD NO.:  [id number]  LOCATION:  5N14C                        FACILITY:  [facility]  PHYSICIAN:  Roy [name]. Roy [name], M.D.DATE OF BIRTH:  11/10/1955  DATE OF PROCEDURE:  07/07/2012 DATE OF DISCHARGE:                              OPERATIVE REPORT   PREOPERATIVE DIAGNOSIS:  Infected right total knee replacement.  POSTOPERATIVE DIAGNOSIS:  Infected right total knee replacement.  PROCEDURE:  Irrigation and debridement of right total knee replacement. Removal of components and insertion of antibiotic spacers.  SURGEON:  Roy [name]. Roy [name], M.D.  ASSISTANT:  Roy Code, PA-C.  ANESTHESIA:  General supplemental femoral nerve block.  COMPLICATIONS:  None.  PROCEDURE:  Roy [name] was met in the holding area, identified the right knee as the appropriate operative site.  I signed his knee.  He was then transported to room #7 where he was placed under general anesthesia without difficulty.  Anesthesia performed a right femoral nerve block preoperatively.  The patient had a right knee arthroscopy with irrigation on 3 nights ago.  His puncture wounds were still open and he was growing Staph aureus.  There was gross purulence in the joint.  Examination of his knee under anesthesia revealed range of motion 0 to about 60 degrees.  He has had a difficult time in the past with multiple joint contractures, but has been relatively stable prior to the onset of his infection.  He does have history of psoriatic arthritis and has been on Amaryl and methotrexate. The tourniquet was then applied to the right thigh and the leg was prepped with Betadine scrub and then DuraPrep from the tourniquet to the toes.  Sterile draping was performed.  With the extremity elevated, it was Esmarch and exsanguinated with a proximal tourniquet at 350 mmHg. We had called the time-out prior to prep.  The prior midline longitudinal incision was  utilized and via sharp dissection, carried down to subcutaneous tissue.  First layer of capsule was incised in midline and medial parapatellar incision was made with a knife.  There was old Ethibond suture identified and these were removed. The joint was then entered.  There was moderate amount of inflammatory synovitis.  Had some difficulty flexing his knee based on his chronic flexion contracture, so I released the soft tissue medially and laterally, without injury to the MCL, the LCL or the patellar tendon. There was considerable scar around the medial lateral gutters and even along the posterior capsule and these were carefully released that I could flex his knee 90 degrees.  At that point, we performed a radical synovectomy, it was obvious that the femoral component was loose and was easily removed.  There was considerable synovitis beneath the prosthesis consistent with a chronic loosening.  There was no bone attached to either the femoral or the tibial component.  There was some bone loss on a chronic basis.  Any extraneous methacrylate was removed from the femur as well as the synovium.  We did send specimens for culture.  I released the posterior capsule.  The tibial component was then removed by using the oscillating saw, and small osteotomes and carefully was elevated off the tibia.  I then amputated the stem of the polyethylene component, removed it prior to working on the femur.  The tibial component was then easily removed and I carefully removed any extraneous methacrylate from the center hole of the tibia without any significant bone loss.  Wound was then copiously irrigated with saline solution.  I removed the polyethylene component of the rotating patella and then used the oscillating saw to remove the metallic component.  There was still good bone remaining and I think it would certainly except a polyethylene button.  The wound was again irrigated with saline  solution, any further methacrylate was removed.  We then proceeded to insert an antibiotic polymethyl methacrylate spacer.  We used Palacos cement and mixed gentamicin and vancomycin.  We initially used 1 pack of cement and applied that and used the femoral spacer to apply the component.  Once it was mature, we then mixed a second batch of polymethyl methacrylate, and then used a 10-mm bridging bearing applying it to the surface and then placed the knee in extension.  Once the methacrylate was hardened, the knee was placed through full range of motion, which is minimal opening with varus and valgus stress, but we had full extension, able to flex about 50 degrees as the quad was quite tight.  The wound was again irrigated with saline solution.  The deep capsule was infiltrated 0.25% Marcaine with epinephrine.  Tourniquet was deflated at 99 minutes.  I sprayed thrombin throughout the wound and any gross bleeders were Bovie coagulated.  I felt that the field was dry.  A medium-size Hemovac was inserted to the lateral capsule.  The deep capsule was then closed with interrupted #1 PDS, superficial capsule with running 2-0 PDS, and the skin closed with skin clips.  Sterile bulky dressing was applied.  The patient tolerated the procedure without complications.     Roy [name]. Roy [name], M.D.     PWW/MEDQ  D:  07/07/2012  T:  07/08/2012  Job:  [id number]

## 2012-07-08 NOTE — Progress Notes (Signed)
[facility]  Date of Admission:  07/04/2012  Antibiotics: Cefazolin day 2  Subjective: No acute events  Objective: Temp:  [97.5 F (36.4 C)-99 F (37.2 C)] 98.4 F (36.9 C) (05/16 0500) Pulse Rate:  [60-79] 71 (05/16 0500) Resp:  [17-19] 18 (05/16 0757) BP: (117-157)/(63-85) 123/78 mmHg (05/16 0500) SpO2:  [95 %-99 %] 98 % (05/16 0757)  Not examined  Lab Results Lab Results  Component Value Date   WBC 8.7 07/08/2012   HGB 12.0* 07/08/2012   HCT 34.2* 07/08/2012   MCV 81.2 07/08/2012   PLT 155 07/08/2012    Lab Results  Component Value Date   CREATININE 0.73 07/08/2012   BUN 12 07/08/2012   NA 135 07/08/2012   K 4.2 07/08/2012   CL 100 07/08/2012   CO2 27 07/08/2012    Lab Results  Component Value Date   ALT 27 07/06/2012   AST 31 07/06/2012   ALKPHOS 65 07/06/2012   BILITOT 0.4 07/06/2012      Microbiology: Recent Results (from the past 240 hour(s))  BODY FLUID CULTURE     Status: None   Collection Time    07/04/12  6:24 PM      Result Value Range Status   Specimen Description SYNOVIAL FLUID RIGHT KNEE   Final   Special Requests NONE   Final   Gram Stain     Final   Value: ABUNDANT WBC PRESENT, PREDOMINANTLY PMN     ABUNDANT GRAM POSITIVE COCCI     IN PAIRS   Culture     Final   Value: ABUNDANT STAPHYLOCOCCUS AUREUS     Note: RIFAMPIN AND GENTAMICIN SHOULD NOT BE USED AS SINGLE DRUGS FOR TREATMENT OF STAPH INFECTIONS.   Report Status 07/07/2012 FINAL   Final   Organism ID, Bacteria STAPHYLOCOCCUS AUREUS   Final  GRAM STAIN     Status: None   Collection Time    07/04/12  6:25 PM      Result Value Range Status   Specimen Description SYNOVIAL FLUID RIGHT KNEE   Final   Special Requests NONE   Final   Gram Stain     Final   Value: ABUNDANT WBC PRESENT, PREDOMINANTLY PMN     ABUNDANT GRAM POSITIVE COCCI IN PAIRS     Gram Stain Report Called to,Read Back By and Verified With: RN J. CAMP 1944 07/04/12 Riki Rusk.   Report Status  07/04/2012 FINAL   Final  MRSA PCR SCREENING     Status: None   Collection Time    07/05/12  1:01 AM      Result Value Range Status   MRSA by PCR NEGATIVE  NEGATIVE Final   Comment:            The GeneXpert MRSA Assay (FDA     approved for NASAL specimens     only), is one component of a     comprehensive MRSA colonization     surveillance program. It is not     intended to diagnose MRSA     infection nor to guide or     monitor treatment for     MRSA infections.  TISSUE CULTURE     Status: None   Collection Time    07/07/12  8:15 AM      Result Value Range Status   Specimen Description TISSUE RIGHT KNEE   Final   Special Requests RIGHT SUBPROSTESIS SYNOVIUM   Final   Gram Stain  Final   Value: FEW WBC PRESENT,BOTH PMN AND MONONUCLEAR     NO SQUAMOUS EPITHELIAL CELLS SEEN     RARE GRAM POSITIVE COCCI     IN CLUSTERS   Culture Culture reincubated for better growth   Final   Report Status PENDING   Incomplete  ANAEROBIC CULTURE     Status: None   Collection Time    07/07/12  8:15 AM      Result Value Range Status   Specimen Description TISSUE RIGHT KNEE   Final   Special Requests RIGHT SUBPROSTESIS SYNOVIUM   Final   Gram Stain PENDING   Incomplete   Culture     Final   Value: NO ANAEROBES ISOLATED; CULTURE IN PROGRESS FOR 5 DAYS   Report Status PENDING   Incomplete  ANAEROBIC CULTURE     Status: None   Collection Time    07/07/12  8:18 AM      Result Value Range Status   Specimen Description WOUND KNEE RIGHT   Final   Special Requests SUBPROSTESIS SYNOVIUM   Final   Gram Stain PENDING   Incomplete   Culture     Final   Value: NO ANAEROBES ISOLATED; CULTURE IN PROGRESS FOR 5 DAYS   Report Status PENDING   Incomplete  WOUND CULTURE     Status: None   Collection Time    07/07/12  8:18 AM      Result Value Range Status   Specimen Description WOUND KNEE RIGHT   Final   Special Requests SUBPROSTESIS SYNOVIUM   Final   Gram Stain     Final   Value: RARE WBC PRESENT,  PREDOMINANTLY PMN     NO SQUAMOUS EPITHELIAL CELLS SEEN     RARE GRAM POSITIVE COCCI     IN PAIRS   Culture Culture reincubated for better growth   Final   Report Status PENDING   Incomplete    Studies/Results: No results found.  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  On Ancef.  Will need 6 weeks through June 26th. Will check CRP and ESR for baseline.   -weekly cbc, cmp with results to [facility] -antibiotics per home health protocol, Ancef 2 grams q 8 hours   -picc ordered Will arrange follow up in [facility] in about 2 weeks.  Thanks, call with questions  Staci Righter, MD [facility] [phone] pager   07/08/2012, 11:11 AM

## 2012-07-08 NOTE — Progress Notes (Signed)
Peripherally Inserted Central Catheter/Midline Placement  The IV Nurse has discussed with the patient and/or persons authorized to consent for the patient, the purpose of this procedure and the potential benefits and risks involved with this procedure.  The benefits include less needle sticks, lab draws from the catheter and patient may be discharged home with the catheter.  Risks include, but not limited to, infection, bleeding, blood clot (thrombus formation), and puncture of an artery; nerve damage and irregular heat beat.  Alternatives to this procedure were also discussed.  PICC/Midline Placement Documentation        Lisabeth Devoid 07/08/2012, 12:15 PM

## 2012-07-09 LAB — BASIC METABOLIC PANEL
BUN: 15 mg/dL (ref 6–23)
CO2: 28 mEq/L (ref 19–32)
Calcium: 8.7 mg/dL (ref 8.4–10.5)
Chloride: 100 mEq/L (ref 96–112)
Creatinine, Ser: 0.81 mg/dL (ref 0.50–1.35)
GFR calc Af Amer: >90 mL/min (ref >90)
GFR calc non Af Amer: >90 mL/min (ref >90)
Glucose, Bld: 101 mg/dL — ABNORMAL HIGH (ref 70–99)
Potassium: 3.4 mEq/L — ABNORMAL LOW (ref 3.5–5.1)
Sodium: 139 mEq/L (ref 135–145)

## 2012-07-09 LAB — CBC
HCT: 38 % — ABNORMAL LOW (ref 39.0–52.0)
Hemoglobin: 12.8 g/dL — ABNORMAL LOW (ref 13.0–17.0)
MCH: 27.6 pg (ref 26.0–34.0)
MCHC: 33.7 g/dL (ref 30.0–36.0)
MCV: 82.1 fL (ref 78.0–100.0)
Platelets: 228 10*3/uL (ref 150–400)
RBC: 4.63 MIL/uL (ref 4.22–5.81)
RDW: 14.8 % (ref 11.5–15.5)
WBC: 8.8 10*3/uL (ref 4.0–10.5)

## 2012-07-09 LAB — C-REACTIVE PROTEIN: CRP: 7.7 mg/dL — ABNORMAL HIGH (ref <0.60)

## 2012-07-09 LAB — SEDIMENTATION RATE: Sed Rate: 46 mm/hr — ABNORMAL HIGH (ref 0–16)

## 2012-07-09 LAB — GLUCOSE, CAPILLARY
Glucose-Capillary: 138 mg/dL — ABNORMAL HIGH (ref 70–99)
Glucose-Capillary: 80 mg/dL (ref 70–99)

## 2012-07-09 MED ORDER — KETOROLAC TROMETHAMINE 30 MG/ML IJ SOLN
30.0000 mg | Freq: Once | INTRAMUSCULAR | Status: AC
  Administered 2012-07-09: 30 mg via INTRAVENOUS
  Filled 2012-07-09: qty 1

## 2012-07-09 MED ORDER — OXYCODONE HCL ER 10 MG PO T12A
20.0000 mg | EXTENDED_RELEASE_TABLET | Freq: Two times a day (BID) | ORAL | Status: DC
  Administered 2012-07-09 – 2012-07-12 (×7): 20 mg via ORAL
  Filled 2012-07-09 (×7): qty 2

## 2012-07-09 MED ORDER — KETOROLAC TROMETHAMINE 15 MG/ML IJ SOLN: 7.5000 mg | Freq: Four times a day (QID) | INTRAMUSCULAR | Status: DC

## 2012-07-09 MED ORDER — HYDROMORPHONE HCL PF 1 MG/ML IJ SOLN: 1.0000 mg | INTRAMUSCULAR | Status: DC | PRN

## 2012-07-09 NOTE — Progress Notes (Signed)
Subjective: 2 Days Post-Op Procedure(s) (LRB): EXCISIONAL TOTAL KNEE ARTHROPLASTY WITH ANTIBIOTIC SPACERS (Right) Patient reports pain as severe.  Appears comfortable though.  Vitals are also completely stable. States that he can not go home in this much pain and that he has gotten no sleep  Objective: Vital signs in last 24 hours: Temp:  [97.9 F (36.6 C)-98.5 F (36.9 C)] 98.5 F (36.9 C) (05/17 0459) Pulse Rate:  [76-98] 98 (05/17 0459) Resp:  [16-18] 18 (05/17 0459) BP: (144-157)/(74-78) 157/78 mmHg (05/17 0459) SpO2:  [92 %-98 %] 94 % (05/17 0459)  Intake/Output from previous day: 05/16 0701 - 05/17 0700 In: 980 [P.O.:720; I.V.:210; IV Piggyback:50] Out: 2725 [Urine:2725] Intake/Output this shift:     Recent Labs  07/06/12 1545 07/08/12 0435 07/09/12 0436  HGB 14.3 12.0* 12.8*    Recent Labs  07/08/12 0435 07/09/12 0436  WBC 8.7 8.8  RBC 4.21* 4.63  HCT 34.2* 38.0*  PLT 155 228    Recent Labs  07/08/12 0435 07/09/12 0436  NA 135 139  K 4.2 3.4*  CL 100 100  CO2 27 28  BUN 12 15  CREATININE 0.73 0.81  GLUCOSE 124* 101*  CALCIUM 8.5 8.7    Recent Labs  07/07/12 0518  INR 1.78*    Intact pulses distally Dorsiflexion/Plantar flexion intact Incision: dressing C/D/I Compartment soft  Assessment/Plan: 2 Days Post-Op Procedure(s) (LRB): EXCISIONAL TOTAL KNEE ARTHROPLASTY WITH ANTIBIOTIC SPACERS (Right) Up with therapy Work on pain control.  BLACKMAN,CHRISTOPHER Y 07/09/2012, 7:46 AM

## 2012-07-09 NOTE — Progress Notes (Signed)
Physical Therapy Treatment Patient Details Name: Roy [name] MRN: [id number] DOB: 1956-02-19 Today's Date: 07/09/2012 Time: 6962-9528 PT Time Calculation (min): 36 min  PT Assessment / Plan / Recommendation Comments on Treatment Session  Pt continues to be unable to ambulate, he reports that his pain was out of control last night and he has not yet recovered. Pt is requiring and very elevated surface for transfers and is still needing +2 assist for mobility. Change d/c recommendation to SNF with 24 hr supervision/ assist. PT will continue to follow.    Follow Up Recommendations  SNF;Supervision/Assistance - 24 hour     Does the patient have the potential to tolerate intense rehabilitation     Barriers to Discharge        Equipment Recommendations  None recommended by PT    Recommendations for Other Services OT consult  Frequency Min 5X/week   Plan Discharge plan needs to be updated;Frequency needs to be updated    Precautions / Restrictions Precautions Precautions: Fall;Knee Precaution Comments: No flex  Required Braces or Orthoses: Knee Immobilizer - Right Knee Immobilizer - Right: On at all times Restrictions Weight Bearing Restrictions: Yes RLE Weight Bearing: Partial weight bearing RLE Partial Weight Bearing Percentage or Pounds: 50   Pertinent Vitals/Pain 8/10 bilateral knee pain and nausea upon standing up     Mobility  Bed Mobility Bed Mobility: Supine to Sit;Sitting - Scoot to Edge of Bed;Sit to Supine Supine to Sit: 4: Min assist;HOB elevated;With rails Sitting - Scoot to Edge of Bed: 4: Min guard Sit to Supine: 1: +2 Total assist Sit to Supine: Patient Percentage: 40% Details for Bed Mobility Assistance: pt unable to move RLE on his own at all, supported by therapist Transfers Transfers: Sit to Stand;Stand to Sit Sit to Stand: From elevated surface;1: +2 Total assist;From bed;With upper extremity assist Sit to Stand: Patient Percentage: 50% Stand to  Sit: 1: +2 Total assist;To elevated surface;To bed;With upper extremity assist Stand to Sit: Patient Percentage: 50% Details for Transfer Assistance: pt needs bed elevated significantly to stand with min A due to KI on right and decreased knee flex on left Ambulation/Gait Ambulation/Gait Assistance: 1: +2 Total assist Ambulation/Gait: Patient Percentage: 50% Ambulation Distance (Feet): 3 Feet Assistive device: Rolling walker Ambulation/Gait Assistance Details: attempted fwd ambulation but reported that he was unable to move right LE and would not let therapist help facilitate mvmt due to pain. Stepped to left 3' and bkwds 1'. Very small steps Gait Pattern: Step-to pattern;Trunk flexed Stairs: No Wheelchair Mobility Wheelchair Mobility: No    Exercises Total Joint Exercises Ankle Circles/Pumps: AROM;Both;10 reps Heel Slides: AROM;Left;10 reps;Supine Hip ABduction/ADduction: AAROM;Right;10 reps;Supine Straight Leg Raises: AAROM;Right;10 reps;Supine   PT Diagnosis:    PT Problem List:   PT Treatment Interventions:     PT Goals Acute Rehab PT Goals PT Goal Formulation: With patient Time For Goal Achievement: 07/12/12 Potential to Achieve Goals: Fair Pt will go Supine/Side to Sit: with modified independence PT Goal: Supine/Side to Sit - Progress: Progressing toward goal Pt will go Sit to Supine/Side: with modified independence PT Goal: Sit to Supine/Side - Progress: Progressing toward goal Pt will go Sit to Stand: with modified independence PT Goal: Sit to Stand - Progress: Not progressing Pt will Ambulate: >150 feet;with modified independence;with rolling walker PT Goal: Ambulate - Progress: Not progressing Pt will Go Up / Down Stairs: 3-5 stairs;with supervision;with rail(s);with least restrictive assistive device PT Goal: Up/Down Stairs - Progress: Not progressing Pt will Perform Home Exercise  Program: with supervision, verbal cues required/provided PT Goal: Perform Home  Exercise Program - Progress: Progressing toward goal  Visit Information  Last PT Received On: 07/09/12 Assistance Needed: +2    Subjective Data  Subjective: I had a terrible night last night and I am so tired. I will not get in that chair. Patient Stated Goal: home with family   Cognition  Cognition Arousal/Alertness: Awake/alert Behavior During Therapy: WFL for tasks assessed/performed Overall Cognitive Status: Within Functional Limits for tasks assessed    Balance  Balance Balance Assessed: Yes Static Sitting Balance Static Sitting - Balance Support: Bilateral upper extremity supported;Feet supported Static Sitting - Level of Assistance: 5: Stand by assistance Static Standing Balance Static Standing - Balance Support: Bilateral upper extremity supported;During functional activity Static Standing - Level of Assistance: 4: Min assist  End of Session PT - End of Session Equipment Utilized During Treatment: Gait belt;Right knee immobilizer Activity Tolerance: Patient limited by pain Patient left: in bed;with call bell/phone within reach Nurse Communication: Mobility status   GP   Lyanne Co, PT  Acute Rehab Services  [phone]   Lyanne Co 07/09/2012, 3:27 PM

## 2012-07-10 LAB — BASIC METABOLIC PANEL
BUN: 16 mg/dL (ref 6–23)
CO2: 31 mEq/L (ref 19–32)
Calcium: 8.8 mg/dL (ref 8.4–10.5)
Chloride: 102 mEq/L (ref 96–112)
Creatinine, Ser: 0.9 mg/dL (ref 0.50–1.35)
GFR calc Af Amer: >90 mL/min (ref >90)
GFR calc non Af Amer: >90 mL/min (ref >90)
Glucose, Bld: 90 mg/dL (ref 70–99)
Potassium: 3.7 mEq/L (ref 3.5–5.1)
Sodium: 140 mEq/L (ref 135–145)

## 2012-07-10 LAB — CBC
HCT: 37.5 % — ABNORMAL LOW (ref 39.0–52.0)
Hemoglobin: 13 g/dL (ref 13.0–17.0)
MCH: 28.6 pg (ref 26.0–34.0)
MCHC: 34.7 g/dL (ref 30.0–36.0)
MCV: 82.4 fL (ref 78.0–100.0)
Platelets: 260 10*3/uL (ref 150–400)
RBC: 4.55 MIL/uL (ref 4.22–5.81)
RDW: 14.8 % (ref 11.5–15.5)
WBC: 9.6 10*3/uL (ref 4.0–10.5)

## 2012-07-10 LAB — WOUND CULTURE

## 2012-07-10 NOTE — Progress Notes (Signed)
Physical Therapy Treatment Patient Details Name: Roy [name] MRN: [id number] DOB: May 13, 1955 Today's Date: 07/10/2012 Time: 2952-8413 PT Time Calculation (min): 32 min  PT Assessment / Plan / Recommendation Comments on Treatment Session  Pt. is slowly improving in his mobility though takes significantly increased time on his part for mobility tasks, especially bed mobility    Follow Up Recommendations  SNF;Supervision/Assistance - 24 hour     Does the patient have the potential to tolerate intense rehabilitation     Barriers to Discharge        Equipment Recommendations  None recommended by PT    Recommendations for Other Services    Frequency Min 5X/week   Plan Discharge plan remains appropriate;Frequency remains appropriate    Precautions / Restrictions Precautions Precautions: Fall;Knee Precaution Comments: No flex  Required Braces or Orthoses: Knee Immobilizer - Right Knee Immobilizer - Right: On at all times Restrictions Weight Bearing Restrictions: Yes RLE Weight Bearing: Partial weight bearing RLE Partial Weight Bearing Percentage or Pounds: 50   Pertinent Vitals/Pain See vitals tab     Mobility  Bed Mobility Bed Mobility: Supine to Sit;Sitting - Scoot to Edge of Bed Supine to Sit: 4: Min guard;HOB elevated;With rails Sitting - Scoot to Edge of Bed: 4: Min assist Sit to Supine: 4: Min assist Details for Bed Mobility Assistance: Pt. instructed in using bedsheet to guide his won P LE onto and off bed.  He was able to move his own leg VERY SLOWLY 50% to the edge of the bed then needed min assist to complete.  Min assist for R LE back into bed.   Transfers Transfers: Sit to Stand;Stand to Sit Sit to Stand: From elevated surface;1: +2 Total assist;From bed;With upper extremity assist Sit to Stand: Patient Percentage: 60% Stand to Sit: 1: +2 Total assist;To elevated surface;To bed;With upper extremity assist Stand to Sit: Patient Percentage: 60% Details for  Transfer Assistance: encouraged pt. to have hospital bed height similar to his bed at home for training purposes. He is resistant to this and wants bed height raised higher than his home bed (by his report) Ambulation/Gait Ambulation/Gait Assistance: 1: +2 Total assist Ambulation/Gait: Patient Percentage: 50% Ambulation Distance (Feet): 18 Feet Assistive device: Rolling walker Ambulation/Gait Assistance Details: cues for maintain 50% PWB and  appropriate step lenght into RW Gait Pattern: Step-to pattern;Trunk flexed Gait velocity: decreased    Exercises Total Joint Exercises Goniometric ROM: approximatley -20 extension of knee   PT Diagnosis:    PT Problem List:   PT Treatment Interventions:     PT Goals Acute Rehab PT Goals Pt will go Supine/Side to Sit: with modified independence PT Goal: Supine/Side to Sit - Progress: Progressing toward goal Pt will go Sit to Supine/Side: with modified independence PT Goal: Sit to Supine/Side - Progress: Progressing toward goal Pt will go Sit to Stand: with modified independence PT Goal: Sit to Stand - Progress: Progressing toward goal Pt will Ambulate: >150 feet;with modified independence;with rolling walker PT Goal: Ambulate - Progress: Progressing toward goal  Visit Information  Last PT Received On: 07/10/12 Assistance Needed: +2    Subjective Data  Subjective: I had a terrible night last night and I am so tired. I will not get in that chair.   Cognition  Cognition Arousal/Alertness: Awake/alert Behavior During Therapy: WFL for tasks assessed/performed Overall Cognitive Status: Within Functional Limits for tasks assessed    Balance     End of Session PT - End of Session Equipment Utilized During Treatment:  Gait belt;Right knee immobilizer Activity Tolerance: Patient limited by pain Patient left: in bed;with call bell/phone within reach Nurse Communication: Mobility status   GP     Ferman Hamming 07/10/2012, 2:33  PM Weldon Picking PT Acute Rehab Services [phone] Beeper [phone]

## 2012-07-10 NOTE — Progress Notes (Signed)
Patient ID: Roy [name], male   DOB: [birth year], 57 y.o.   MRN: [id number] Mr. Outten states that the pain medication that were given yesterday helped ease his pain better. I had to put him on several things due to his pain complaints.  Still requiring a lot of work with therapy on mobilization.  States that he is not ready to go home and that his family will have too much difficulty helping him.  He is also requesting a CPM on his non-operative knee so that it does not get worsening stiffness.  Says that he has plates and screws in it from previous injury.  His vitals remain stable.  His knee dressing is clean.  His calf is soft and he is NVI.  He may even need ST-SNF placement.  He states that he wants to wait until tomorrow to discuss things further with Dr. Cleophas Dunker.

## 2012-07-10 NOTE — Progress Notes (Signed)
Occupational Therapy Treatment Patient Details Name: Roy [name] MRN: [id number] DOB: 1955/09/06 Today's Date: 07/10/2012 Time: 4132-4401 OT Time Calculation (min): 32 min  OT Assessment / Plan / Recommendation Comments on Treatment Session pt. with noted improvement from previous doc., also notes improved pain management.  however, therapeutic progression/tx. remains limited secondary to pt. resistance to all therapist inst. and tx. ideas.     Follow Up Recommendations  Home health OT;Supervision/Assistance - 24 hour                    Frequency Min 2X/week   Plan Discharge plan remains appropriate    Precautions / Restrictions Precautions Precautions: Fall;Knee Required Braces or Orthoses: Knee Immobilizer - Right Restrictions RLE Weight Bearing: Partial weight bearing   Pertinent Vitals/Pain 4/10    ADL  Transfers/Ambulation Related to ADLs: tot a +2, mostly for walker management and secondary to pt.s "requirements" for sequencing and movement of RLE ADL Comments: declined LB dressing (donning/doffing socks) states "my wife has already been doing that for me and will con't. to do so", declinded all aspects of toileting         OT Goals ADL Goals ADL Goal: Lower Body Bathing - Progress: Not progressing ADL Goal: Lower Body Dressing - Progress: Not progressing ADL Goal: Toilet Transfer - Progress: Not progressing ADL Goal: Toileting - Hygiene - Progress: Not progressing ADL Goal: Tub/Shower Transfer - Progress: Not progressing  Visit Information  Last OT Received On: 07/10/12    Subjective Data  Subjective: "I am not going to sit in a chair, i am going to walk and i am going to tell you how this is going to go"          Cognition  Cognition Arousal/Alertness: Awake/alert Behavior During Therapy: [facility] for tasks assessed/performed (some anxiety with movement of RLE) Overall Cognitive Status: Within Functional Limits for tasks assessed    Mobility  Bed  Mobility Bed Mobility: Supine to Sit;Sitting - Scoot to Edge of Bed Supine to Sit: 4: Min guard;HOB elevated;With rails Sitting - Scoot to Edge of Bed: 4: Min assist;Other (comment) Sit to Supine: 4: Min assist Details for Bed Mobility Assistance: pt. able to use sheet as a support to guide RLE VERY SLOWLY, with therapist assistance required once le moved closer to eob and for contolling descend to floor Transfers Transfers: Sit to Stand;Stand to Sit Stand to Sit: 1: +2 Total assist;To elevated surface;To bed;With upper extremity assist Stand to Sit: Patient Percentage: 50% Details for Transfer Assistance: encouraged pt. to tolerate bed height to simulate home environement, pt. with great resistance and con't. to want bed height adjusted with sit/stand, and stand/sit               End of Session OT - End of Session Equipment Utilized During Treatment: Gait belt;Right knee immobilizer Activity Tolerance: Patient limited by pain;Other (comment) (resistance to all therapist inst. and recommendations) Patient left: in bed;with call bell/phone within reach       Robet Leu 07/10/2012, 11:06 AM

## 2012-07-11 LAB — TISSUE CULTURE

## 2012-07-11 MED ORDER — DIPHENHYDRAMINE HCL 25 MG PO CAPS
25.0000 mg | ORAL_CAPSULE | Freq: Four times a day (QID) | ORAL | Status: DC | PRN
  Administered 2012-07-11: 25 mg via ORAL
  Filled 2012-07-11: qty 1

## 2012-07-11 NOTE — Progress Notes (Signed)
Orthopedic Tech Progress Note Patient Details:  Roy [name] April 28, 1955 [id number]  Patient ID: Roy [name], male   DOB: 07-15-1955, 57 y.o.   MRN: [id number]   Roy [name] 07/11/2012, 8:22 PM Pt comfortable @40  degrees in cpm;rn notified

## 2012-07-11 NOTE — Progress Notes (Signed)
PATIENT ID: Roy [name]        MRN:  [id number]          DOB/AGE: 1955-07-31 / 57 y.o.    Roy Campbell, MD   Roy Code, PA-C [address] [place], [place]  [address]                             [phone]   PROGRESS NOTE  Subjective:  negative for Chest Pain  negative for Shortness of Breath  negative for Nausea/Vomiting   negative for Calf Pain    Tolerating Diet: yes         Patient reports pain as mild and moderate.     No BM yet.  States he believes that he can not go home till Wednesday  Objective: Vital signs in last 24 hours:   Patient Vitals for the past 24 hrs:  BP Temp Temp src Pulse Resp SpO2  07/11/12 0610 93/65 mmHg 97.3 F (36.3 C) Oral 83 18 95 %  07/10/12 2111 118/58 mmHg 98.6 F (37 C) Oral 87 16 92 %  07/10/12 1629 - - - - 16 97 %  07/10/12 1147 - - - - 18 98 %  07/10/12 0836 - - - - 18 97 %      Intake/Output from previous day:   05/18 0701 - 05/19 0700 In: 840 [P.O.:840] Out: 2200 [Urine:2200]   Intake/Output this shift:       Intake/Output     05/18 0701 - 05/19 0700 05/19 0701 - 05/20 0700   P.O. 840    Total Intake(mL/kg) 840 (9.3)    Urine (mL/kg/hr) 2200 (1)    Total Output 2200     Net -1360             LABORATORY DATA:  Recent Labs  07/04/12 1701 07/04/12 1720 07/06/12 1545 07/08/12 0435 07/09/12 0436 07/10/12 0533  WBC 14.6*  --  6.8 8.7 8.8 9.6  HGB 15.3 15.0 14.3 12.0* 12.8* 13.0  HCT 42.5 44.0 40.2 34.2* 38.0* 37.5*  PLT 186  --  167 155 228 260    Recent Labs  07/04/12 1720 07/06/12 1545 07/08/12 0435 07/09/12 0436 07/10/12 0533  NA 139 135 135 139 140  K 3.5 3.4* 4.2 3.4* 3.7  CL 106 99 100 100 102  CO2  --  28 27 28 31   BUN 16 10 12 15 16   CREATININE 0.70 0.90 0.73 0.81 0.90  GLUCOSE 113* 120* 124* 101* 90  CALCIUM  --  8.7 8.5 8.7 8.8   Lab Results  Component Value Date   INR 1.78* 07/07/2012   INR 1.64* 07/06/2012   INR 1.10 07/05/2012    Recent Radiographic Studies :  Dg Knee  1-2 Views Right  07/03/2012   *RADIOLOGY REPORT*  Clinical Data: Anterior right knee pain.  RIGHT KNEE - 1-2 VIEW  Comparison: None.  Findings: Cross-table lateral view demonstrates a moderate sized knee effusion.  Three-part total knee arthroplasty is present. There are is no osteolysis along the tibial tray medially and laterally, suggesting early loosening and / or particle disease. Similar changes are present around the femoral component.  IMPRESSION:  1.  No periprosthetic fracture. 2.  Moderate effusion. 3.  Lucency around the tibial tray and femoral components suggests early loosening which may be aseptic, particle disease or infection.   Original Report Authenticated By: Andreas Newport, M.D.   Dg Chest Port 1  View  07/04/2012   *RADIOLOGY REPORT*  Clinical Data: Postop, shortness of breath  PORTABLE CHEST - 1 VIEW  Comparison: 05/07/2011  Findings: Patchy airspace opacity left lung.  Hypoaeration with interstitial and vascular crowding.  Cardiomediastinal prominence is likely accentuated by hypoaeration.  No pneumothorax or definite pleural effusion.  Multilevel degenerative changes.  IMPRESSION: Patchy airspace opacity left lung may reflect asymmetric edema, pneumonia, or aspiration in the appropriate clinical setting.   Original Report Authenticated By: Jearld Lesch, M.D.     Examination:  General appearance: alert, cooperative and mild distress Resp: clear to auscultation bilaterally Cardio: regular rate and rhythm GI: normal findings: bowel sounds normal  Wound Exam: clean, dry, intact   Drainage:  None: wound tissue dry  Motor Exam: EHL, FHL, Anterior Tibial and Posterior Tibial Intact  Sensory Exam: Superficial Peroneal, Deep Peroneal and Tibial normal  Vascular Exam: Right dorsalis pedis artery has trace pulse  Assessment:    4 Days Post-Op  Procedure(s) (LRB): EXCISIONAL TOTAL KNEE ARTHROPLASTY WITH ANTIBIOTIC SPACERS (Right)  ADDITIONAL DIAGNOSIS:  Principal  Problem:   Infection of total right knee replacement Active Problems:   Psoriatic arthritis, destructive type     Plan: Physical Therapy as ordered Partial Weight Bearing @ 50% (PWB)  DVT Prophylaxis:  Xarelto and TED hose  DISCHARGE PLAN: Home  DISCHARGE NEEDS: HHPT, HHRN, CPM (pt wants for left knee), Walker, 3-in-1 comode seat and IV Antibiotics         Roy [name] 07/11/2012 8:14 AM

## 2012-07-11 NOTE — Progress Notes (Addendum)
Clinical Social Work Department  CLINICAL SOCIAL WORK PLACEMENT NOTE  07/11/2012 Patient:Roy [name] Account Number: [id number]   Admit date: 01/27/2012  Clinical Social Worker: Sabino Niemann LCSWA Date/time: 01/31/2012 11:30 AM  Clinical Social Work is seeking post-discharge placement for this patient at the following level of care: SKILLED NURSING (*CSW will update this form in Epic as items are completed)  07/11/2012 Patient/family provided with [facility] Department of Clinical Social Work's list of facilities offering this level of care within the geographic area requested by the patient (or if unable, by the patient's family).  07/11/2012 Patient/family informed of their freedom to choose among providers that offer the needed level of care, that participate in Medicare, Medicaid or managed care program needed by the patient, have an available bed and are willing to accept the patient.  5/19/2014Patient/family informed of [facility]' ownership interest in [facility], as well as of the fact that they are under no obligation to receive care at this facility.  PASARR submitted to EDS on 07/11/2012 PASARR number received from EDS on 07/11/2012  FL2 transmitted to all facilities in geographic area requested by pt/family on 07/11/2012 FL2 transmitted to all facilities within larger geographic area on  Patient informed that his/her managed care company has contracts with or will negotiate with certain facilities, including the following:  Patient/family informed of bed offers received: 07/11/2012 Patient chooses bed at [facility] Physician recommends and patient chooses bed at  Patient to be transferred to on  Patient to be transferred to facility by  The following physician request were entered in Epic:  Additional Comments: Signing off

## 2012-07-11 NOTE — Progress Notes (Signed)
Physical Therapy Treatment Patient Details Name: Roy [name] MRN: [id number] DOB: [birth year] Today's Date: 07/11/2012 Time: 4098-1191 PT Time Calculation (min): 19 min  PT Assessment / Plan / Recommendation Comments on Treatment Session  pt slowly progressing with therapy. Pt requires increased time to complete tasks due to pain with movement and fatigue. Pt plans to D/C home with family assistance. At this time PT cont to recommend ST-SNF due to limited mobility and assistance needed. Will cont to f/u with pt.    Follow Up Recommendations  SNF;Supervision/Assistance - 24 hour     Does the patient have the potential to tolerate intense rehabilitation     Barriers to Discharge        Equipment Recommendations  None recommended by PT    Recommendations for Other Services    Frequency Min 5X/week   Plan Discharge plan remains appropriate;Frequency remains appropriate    Precautions / Restrictions Precautions Precautions: Fall;Knee Precaution Comments: No flex  Required Braces or Orthoses: Knee Immobilizer - Right Knee Immobilizer - Right: On at all times Restrictions Weight Bearing Restrictions: Yes RLE Weight Bearing: Partial weight bearing RLE Partial Weight Bearing Percentage or Pounds: 50 Other Position/Activity Restrictions: progress to WBAT   Pertinent Vitals/Pain 3/10; pt positioned with towel under ankle for comfort. Pt premedicated.     Mobility  Bed Mobility Bed Mobility: Supine to Sit;Sitting - Scoot to Edge of Bed Supine to Sit: 4: Min assist;HOB elevated Sitting - Scoot to [place] of Bed: 4: Min guard Details for Bed Mobility Assistance: required assistance to advance R LE to EOB; requires increased time due to pain.  Transfers Transfers: Sit to Stand;Stand to Sit Sit to Stand: From elevated surface;From bed;3: Mod assist Stand to Sit: 3: Mod assist;With armrests;To chair/3-in-1 Details for Transfer Assistance: pt is very impulsive and tends to be  non-compliant to directions and cues given by therapy. Demo decreased safety awareness; cont to pull from sit to stand with RW;  pt demo decreased ability to fully flex L LE due to "stiffness"  Ambulation/Gait Ambulation/Gait Assistance: 3: Mod assist Ambulation Distance (Feet): 12 Feet Assistive device: Rolling walker Ambulation/Gait Assistance Details: decreased safety awareness; given constant vc's for gt sequencing and cues for upright posture; pt demo labored breathing and unable to advanced amb due to fatigue   Gait Pattern: Step-to pattern;Trunk flexed Gait velocity: decreased Stairs: No Wheelchair Mobility Wheelchair Mobility: No    Exercises Total Joint Exercises Ankle Circles/Pumps: AROM;Both;10 reps   PT Diagnosis:    PT Problem List:   PT Treatment Interventions:     PT Goals Acute Rehab PT Goals PT Goal Formulation: With patient Time For Goal Achievement: 07/12/12 Potential to Achieve Goals: Fair PT Goal: Supine/Side to Sit - Progress: Progressing toward goal PT Goal: Sit to Stand - Progress: Progressing toward goal PT Goal: Ambulate - Progress: Progressing toward goal PT Goal: Up/Down Stairs - Progress: Discontinued (comment) (Pt plans to D/C home with family where he will have a ramp) PT Goal: Perform Home Exercise Program - Progress: Progressing toward goal  Visit Information  Last PT Received On: 07/11/12 Assistance Needed: +1    Subjective Data  Subjective: "I think i am going to try and go home. I dont want to go till Wednesday. I have things I want to do before then."  Patient Stated Goal: home with family   Cognition  Cognition Arousal/Alertness: Awake/alert Behavior During Therapy: WFL for tasks assessed/performed Overall Cognitive Status: Within Functional Limits for tasks assessed  Balance  Balance Balance Assessed: No  End of Session PT - End of Session Equipment Utilized During Treatment: Gait belt;Right knee immobilizer Activity Tolerance:  Patient limited by fatigue Patient left: in chair;with call bell/phone within reach Nurse Communication: Mobility status   GP     Donell Sievert, [place] [phone] 07/11/2012, 12:52 PM

## 2012-07-11 NOTE — Progress Notes (Signed)
Patient states that he is agreeable to go to a skilled facility as wife works and he does not have any help at home. Will defer CIR consult.

## 2012-07-11 NOTE — Progress Notes (Signed)
Physical Therapy Treatment Patient Details Name: Roy [name] MRN: [id number] DOB: 01/28/1956 Today's Date: 07/11/2012 Time: 4098-1191 PT Time Calculation (min): 28 min  PT Assessment / Plan / Recommendation Comments on Treatment Session  Pt slowly progressing. Demo decreased safety awareness and mobility. C/o pain in L knee with decreased flexion in L knee today as well as the surgical knee. Is hopeful to go home with family support. Cont to recommend SNF for safest D/C option due to assistance needed and decreased mobility.     Follow Up Recommendations  SNF;Supervision/Assistance - 24 hour     Does the patient have the potential to tolerate intense rehabilitation     Barriers to Discharge        Equipment Recommendations  None recommended by PT    Recommendations for Other Services    Frequency Min 5X/week   Plan Discharge plan remains appropriate;Frequency remains appropriate    Precautions / Restrictions Precautions Precautions: Fall;Knee Precaution Comments: No flex  Required Braces or Orthoses: Knee Immobilizer - Right Knee Immobilizer - Right: On at all times Restrictions Weight Bearing Restrictions: Yes RLE Weight Bearing: Partial weight bearing RLE Partial Weight Bearing Percentage or Pounds: 50 Other Position/Activity Restrictions: progress to WBAT   Pertinent Vitals/Pain No new complaints.    Mobility  Bed Mobility Bed Mobility: Not assessed Supine to Sit: 4: Min assist;HOB elevated Sitting - Scoot to Edge of Bed: 4: Min guard Details for Bed Mobility Assistance: sitting in chair upon arrival  Transfers Transfers: Sit to Stand;Stand to Sit Sit to Stand: 3: Mod assist;From chair/3-in-1;With upper extremity assist;With armrests Stand to Sit: 3: Mod assist;With upper extremity assist;To bed Details for Transfer Assistance: pt requires verbal cues for hand placement and safety with transfers; decreased independence due to L knee pain and decreased ROM in L  knee today along with pain in R knee as well. 2+ assistance required from lower surfaces.  Ambulation/Gait Ambulation/Gait Assistance: 4: Min assist Ambulation Distance (Feet): 10 Feet (x2) Assistive device: Rolling walker Ambulation/Gait Assistance Details: verbal cues for gt sequencing and safety; pt amb with fwd flexed position and decrease knee flexion on L LE; circumducts L LE to clear foot during swing phase Gait Pattern: Step-to pattern;Trunk flexed (decreased L knee flexion; circumducts L LE to clear foot ) Gait velocity: decreased Stairs: No Wheelchair Mobility Wheelchair Mobility: No    Exercises Total Joint Exercises Ankle Circles/Pumps: AROM;Both;10 reps   PT Diagnosis:    PT Problem List:   PT Treatment Interventions:     PT Goals Acute Rehab PT Goals PT Goal Formulation: With patient Time For Goal Achievement: 07/12/12 Potential to Achieve Goals: Fair PT Goal: Supine/Side to Sit - Progress: Progressing toward goal PT Goal: Sit to Stand - Progress: Progressing toward goal PT Goal: Ambulate - Progress: Progressing toward goal PT Goal: Up/Down Stairs - Progress: Discontinued (comment) (Pt plans to D/C home with family where he will have a ramp) PT Goal: Perform Home Exercise Program - Progress: Progressing toward goal  Visit Information  Last PT Received On: 07/11/12 Assistance Needed: +2 (for sit to stand from lower surfacees) PT/OT Co-Evaluation/Treatment: Yes    Subjective Data  Subjective: "I want to try and toilet and get cleaned up. I have no idea where im going." Patient Stated Goal: undecided if he is going home or rehab   Cognition  Cognition Arousal/Alertness: Awake/alert Behavior During Therapy: WFL for tasks assessed/performed Overall Cognitive Status: Within Functional Limits for tasks assessed    Balance  Balance  Balance Assessed: No  End of Session PT - End of Session Equipment Utilized During Treatment: Gait belt;Right knee  immobilizer Activity Tolerance: Patient tolerated treatment well Patient left: in bed;with call bell/phone within reach (OT present) Nurse Communication: Mobility status   GP     Donell Sievert, [place] [phone] 07/11/2012, 4:22 PM

## 2012-07-11 NOTE — Progress Notes (Signed)
Occupational Therapy Treatment Patient Details Name: [name] MRN: [id number] DOB: Feb 23, 1956 Today's Date: 07/11/2012 Time: 1324-4010 OT Time Calculation (min): 35 min  OT Assessment / Plan / Recommendation Comments on Treatment Session  Pt progressing towards goals. Practiced toileting and also LB dressing today.     Follow Up Recommendations  SNF;Supervision/Assistance - 24 hour    Barriers to Discharge       Equipment Recommendations       Recommendations for Other Services    Frequency Min 2X/week   Plan Discharge plan needs to be updated    Precautions / Restrictions Precautions Precautions: Fall;Knee Precaution Comments: No flex  Required Braces or Orthoses: Knee Immobilizer - Right Knee Immobilizer - Right: On at all times Restrictions Weight Bearing Restrictions: Yes RLE Weight Bearing: Partial weight bearing RLE Partial Weight Bearing Percentage or Pounds: 50 Other Position/Activity Restrictions: progress to WBAT   Pertinent Vitals/Pain Pain 3/10 after ambulation. Repositioned.     ADL  Lower Body Dressing: Performed;Min guard (Mod A to stand, once standing=[place]) Where Assessed - Lower Body Dressing: Supported sit to stand Toilet Transfer: Performed;Moderate assistance Toilet Transfer Method: Sit to stand Toilet Transfer Equipment: Raised toilet seat with arms (or 3-in-1 over toilet) Toileting - Clothing Manipulation and Hygiene: +1 Total assistance Where Assessed - Toileting Clothing Manipulation and Hygiene: Sit to stand from 3-in-1 or toilet (clothing- gown was already open) Equipment Used: Gait belt;Knee Immobilizer;Rolling walker;Reacher ADL Comments: Pt performed toileting to 3 in 1 and required total A for hygiene. Pt practiced donning shorts with reacher.     OT Diagnosis:    OT Problem List:   OT Treatment Interventions:     OT Goals Acute Rehab OT Goals OT Goal Formulation: With patient Time For Goal Achievement:  07/15/12 Potential to Achieve Goals: Good ADL Goals Pt Will Perform Lower Body Bathing: with supervision;Sit to stand from chair Pt Will Perform Lower Body Dressing: with supervision;Sit to stand from bed;Sit to stand from chair ADL Goal: Lower Body Dressing - Progress: Progressing toward goals Pt Will Transfer to Toilet: Ambulation;with DME;with modified independence ADL Goal: Toilet Transfer - Progress: Progressing toward goals Pt Will Perform Toileting - Clothing Manipulation: with supervision;Standing Pt Will Perform Toileting - Hygiene: with supervision;Sit to stand from 3-in-1/toilet;Sitting on 3-in-1 or toilet ADL Goal: Toileting - Hygiene - Progress: Progressing toward goals Pt Will Perform Tub/Shower Transfer: Tub transfer;with supervision;Ambulation;with DME  Visit Information  Last OT Received On: 07/11/12 Assistance Needed: +2 (for sit to stand from lower surfaces)    Subjective Data      Prior Functioning       Cognition  Cognition Arousal/Alertness: Awake/alert Behavior During Therapy: WFL for tasks assessed/performed Overall Cognitive Status: Within Functional Limits for tasks assessed    Mobility  Bed Mobility Bed Mobility: Sit to Supine;Scooting to HOB Sit to Supine: 4: Min assist Scooting to [facility]: 5: Supervision;Other (comment) ([place] postition) Details for Bed Mobility Assistance: Min A to assist legs onto bed Transfers Transfers: Sit to Stand;Stand to Sit Sit to Stand: 3: Mod assist;From chair/3-in-1;With upper extremity assist;With armrests Stand to Sit: 3: Mod assist;With upper extremity assist;To bed;To chair/3-in-1 Details for Transfer Assistance: pt requires verbal cues for hand placement and safety with transfers; decreased independence due to L knee pain and decreased ROM in L knee today along with pain in R knee as well. 2+ assistance required from lower surfaces.           End of Session OT - End of Session  Equipment Utilized During  Treatment: Gait belt;Right knee immobilizer Activity Tolerance: Patient tolerated treatment well Patient left: in bed;with call bell/phone within reach  [employer] OTR/L [phone] 07/11/2012, 4:51 PM

## 2012-07-11 NOTE — Progress Notes (Signed)
Orthopedic Tech Progress Note Patient Details:  Roy [name] Sep 20, 1955 [id number]  CPM Left Knee CPM Left Knee: On Left Knee Flexion (Degrees): 40 Left Knee Extension (Degrees): 40  ohf will be provided when one becomes available;rn notified Nikki Dom 07/11/2012, 8:21 PM

## 2012-07-12 LAB — BASIC METABOLIC PANEL
BUN: 15 mg/dL (ref 6–23)
CO2: 24 mEq/L (ref 19–32)
Calcium: 9 mg/dL (ref 8.4–10.5)
Chloride: 98 mEq/L (ref 96–112)
Creatinine, Ser: 0.82 mg/dL (ref 0.50–1.35)
GFR calc Af Amer: >90 mL/min (ref >90)
GFR calc non Af Amer: >90 mL/min (ref >90)
Glucose, Bld: 88 mg/dL (ref 70–99)
Potassium: 4.2 mEq/L (ref 3.5–5.1)
Sodium: 135 mEq/L (ref 135–145)

## 2012-07-12 LAB — CBC
HCT: 38 % — ABNORMAL LOW (ref 39.0–52.0)
Hemoglobin: 13.1 g/dL (ref 13.0–17.0)
MCH: 28.7 pg (ref 26.0–34.0)
MCHC: 34.5 g/dL (ref 30.0–36.0)
MCV: 83.2 fL (ref 78.0–100.0)
Platelets: 382 10*3/uL (ref 150–400)
RBC: 4.57 MIL/uL (ref 4.22–5.81)
RDW: 14.8 % (ref 11.5–15.5)
WBC: 12.2 10*3/uL — ABNORMAL HIGH (ref 4.0–10.5)

## 2012-07-12 LAB — ANAEROBIC CULTURE

## 2012-07-12 MED ORDER — RIVAROXABAN 10 MG PO TABS: 10.0000 mg | ORAL_TABLET | Freq: Every day | ORAL | Status: DC

## 2012-07-12 MED ORDER — HEPARIN SOD (PORK) LOCK FLUSH 100 UNIT/ML IV SOLN
250.0000 [IU] | INTRAVENOUS | Status: DC | PRN
  Administered 2012-07-12: 250 [IU]
  Filled 2012-07-12: qty 3

## 2012-07-12 MED ORDER — HEPARIN SOD (PORK) LOCK FLUSH 100 UNIT/ML IV SOLN
250.0000 [IU] | Freq: Every day | INTRAVENOUS | Status: DC
  Filled 2012-07-12: qty 3

## 2012-07-12 MED ORDER — OXYCODONE HCL ER 20 MG PO T12A: 20.0000 mg | EXTENDED_RELEASE_TABLET | Freq: Two times a day (BID) | ORAL | Status: DC

## 2012-07-12 MED ORDER — METHOCARBAMOL 500 MG PO TABS: 500.0000 mg | ORAL_TABLET | Freq: Four times a day (QID) | ORAL | Status: DC | PRN

## 2012-07-12 MED ORDER — CEFAZOLIN SODIUM-DEXTROSE 2-3 GM-% IV SOLR: 2.0000 g | Freq: Three times a day (TID) | INTRAVENOUS | Status: DC

## 2012-07-12 MED ORDER — HYDROCODONE-ACETAMINOPHEN 5-325 MG PO TABS: 1.0000 | ORAL_TABLET | Freq: Four times a day (QID) | ORAL | Status: DC | PRN

## 2012-07-12 NOTE — Discharge Summary (Signed)
[name], MD   [name], PA-C [address] [place], [place]  [address]                             [phone]  PATIENT ID: Roy [name]        MRN:  [id number]          DOB/AGE: [birth year] / 57 y.o.    DISCHARGE SUMMARY  ADMISSION DATE:    07/04/2012 DISCHARGE DATE:   07/12/2012   ADMISSION DIAGNOSIS: Septic arthritis of knee, right [711.06]    DISCHARGE DIAGNOSIS:  Infected Right Total Knee Replacement    ADDITIONAL DIAGNOSIS: Principal Problem:   Infection of total right knee replacement Active Problems:   Psoriatic arthritis, destructive type  Past Medical History  Diagnosis Date  . Drug addict     Clean for 20 years  . Psoriatic arthritis     PROCEDURE: Procedure(s): EXCISIONAL TOTAL KNEE ARTHROPLASTY WITH ANTIBIOTIC SPACERS [place] 07/04/2012 - 07/07/2012  CONSULTS: INFECTIOUS DISEASE      HISTORY: Patient is a 57 year old gentleman with history of psoriatic arthritis currently on disease modifying drugs who developed swelling redness and pain in his right knee this Sunday. Initially seen in emergency room will return to the emergency room today with a white cell count greater than 100,000 with a purulent aspirate from his right knee. He is status post total knee arthroplasty approximately 2002 by Dr. Peter Whitfield.    HOSPITAL COURSE:  Roy [name] is a 57 y.o. admitted on 07/04/2012 and found to have a diagnosis of Infected Right Total Knee Replacement.  Underwent arthroscopic I&D of the infected right TKR by Dr Duda.  Begun on antibiotics. After appropriate laboratory studies were obtained  they were taken back to the operating room on  07/07/2012 and underwent  Procedure(s): EXCISIONAL TOTAL KNEE ARTHROPLASTY WITH ANTIBIOTIC SPACERS Right.   They were given perioperative antibiotics:  Anti-infectives   Start     Dose/Rate Route Frequency Ordered Stop   07/12/12 0000  ceFAZolin (ANCEF) 2-3 GM-% SOLR     2 g 100 mL/hr over 30 Minutes  Intravenous Every 8 hours 07/12/12 1031     07/08/12 1400  ceFAZolin (ANCEF) IVPB 2 g/50 mL premix     2 g 100 mL/hr over 30 Minutes Intravenous 3 times per day 07/08/12 1202     05 /15/14 1800  ceFAZolin (ANCEF) IVPB 1 g/50 mL premix  Status:  Discontinued     1 g 100 mL/hr over 30 Minutes Intravenous 3 times per day 07/07/12 1728 07/08/12 1202   07/07/12 0809  vancomycin (VANCOCIN) powder  Status:  Discontinued       As needed 07/07/12 0809 07/07/12 1010   07/07/12 0800  vancomycin (VANCOCIN) 1,500 mg in sodium chloride 0.9 % 500 mL IVPB  Status:  Discontinued     1,500 mg 250 mL/hr over 120 Minutes Intravenous Every 8 hours 07/07/12 0025 07/07/12 0028   07/07/12 0600  vancomycin (VANCOCIN) 1,500 mg in sodium chloride 0.9 % 500 mL IVPB  Status:  Discontinued     1,500 mg 250 mL/hr over 120 Minutes Intravenous Every 8 hours 07/07/12 0028 07/07/12 1728   07/05/12 0600  vancomycin (VANCOCIN) IVPB 1000 mg/200 mL premix     1,000 mg 200 mL/hr over 60 Minutes Intravenous Every 8 hours 07/04/12 2320 07/07/12 0117   07/05/12 0000  piperacillin-tazobactam (ZOSYN) IVPB 3.375 g  Status:  Discontinued  3.375 g 12.5 mL/hr over 240 Minutes Intravenous 4 times per day 07/04/12 2304 07/04/12 2320   07/05/12 0000  piperacillin-tazobactam (ZOSYN) IVPB 3.375 g  Status:  Discontinued     3.375 g 12.5 mL/hr over 240 Minutes Intravenous 3 times per day 07/04/12 2320 07/07/12 1728   07/04/12 2315  vancomycin (VANCOCIN) IVPB 1000 mg/200 mL premix  Status:  Discontinued     1,000 mg 200 mL/hr over 60 Minutes Intravenous Every 12 hours 07/04/12 2304 07/04/12 2320   07/04/12 2030  Ampicillin-Sulbactam (UNASYN) 3 g in sodium chloride 0.9 % 100 mL IVPB     3 g 100 mL/hr over 60 Minutes Intravenous  Once 07/04/12 2030 07/04/12 2158    .  Tolerated the procedure well. Placed on Ofirmev. Continued on IV antibiotics and a consult to ID for specific antibiotics.  POD #1, allowed out of bed to a chair.  PT for  ambulation and exercise program.  Foley D/C'd in morning.  IV saline locked.  O2 discontionued. Hemovac pulled.  POD #2, continued PT and ambulation.   . Had problems with pain management as well as his ability to do PT. SNF was ordered but today he requested discharge to home.  The remainder of the hospital course was dedicated to ambulation and strengthening.   The patient was discharged on 5 Days Post-Op in  Stable condition.  Blood products given:none  DIAGNOSTIC STUDIES: Recent vital signs: Patient Vitals for the past 24 hrs:  BP Temp Pulse Resp SpO2  07/12/12 0555 136/74 mmHg 98.9 F (37.2 C) 77 16 94 %  07/11/12 2138 138/82 mmHg 98.9 F (37.2 C) 89 16 96 %  07/11/12 1600 - - - 16 96 %  07/11/12 1430 133/82 mmHg 97.9 F (36.6 C) 98 17 95 %  07/11/12 1200 - - - 16 98 %       Recent laboratory studies:  Recent Labs  07/06/12 1545 07/08/12 0435 07/09/12 0436 07/10/12 0533 07/12/12 0500  WBC 6.8 8.7 8.8 9.6 12.2*  HGB 14.3 12.0* 12.8* 13.0 13.1  HCT 40.2 34.2* 38.0* 37.5* 38.0*  PLT 167 155 228 260 382    Recent Labs  07/06/12 1545 07/08/12 0435 07/09/12 0436 07/10/12 0533 07/12/12 0545  NA 135 135 139 140 135  K 3.4* 4.2 3.4* 3.7 4.2  CL 99 100 100 102 98  CO2 28 27 28 31 24   BUN 10 12 15 16 15   CREATININE 0.90 0.73 0.81 0.90 0.82  GLUCOSE 120* 124* 101* 90 88  CALCIUM 8.7 8.5 8.7 8.8 9.0   Lab Results  Component Value Date   INR 1.78* 07/07/2012   INR 1.64* 07/06/2012   INR 1.10 07/05/2012     Recent Radiographic Studies :  Dg Knee 1-2 Views Right  07/03/2012   *RADIOLOGY REPORT*  Clinical Data: Anterior right knee pain.  RIGHT KNEE - 1-2 VIEW  Comparison: None.  Findings: Cross-table lateral view demonstrates a moderate sized knee effusion.  Three-part total knee arthroplasty is present. There are is no osteolysis along the tibial tray medially and laterally, suggesting early loosening and / or particle disease. Similar changes are present around the  femoral component.  IMPRESSION:  1.  No periprosthetic fracture. 2.  Moderate effusion. 3.  Lucency around the tibial tray and femoral components suggests early loosening which may be aseptic, particle disease or infection.   Original Report Authenticated By: Andreas Newport, M.D.   Dg Chest Port 1 View  07/04/2012   *RADIOLOGY REPORT*  Clinical Data: Postop, shortness of breath  PORTABLE CHEST - 1 VIEW  Comparison: 05/07/2011  Findings: Patchy airspace opacity left lung.  Hypoaeration with interstitial and vascular crowding.  Cardiomediastinal prominence is likely accentuated by hypoaeration.  No pneumothorax or definite pleural effusion.  Multilevel degenerative changes.  IMPRESSION: Patchy airspace opacity left lung may reflect asymmetric edema, pneumonia, or aspiration in the appropriate clinical setting.   Original Report Authenticated By: Jearld Lesch, M.D.    DISCHARGE INSTRUCTIONS: Discharge Orders   Future Orders Complete By Expires     CPM  As directed     Comments:      Continuous passive motion machine (CPM): LEFT LEG      Use the CPM from 0 to 60 for 6-8 hours per day.      You may increase by 5-10 per day.  You may break it up into 2 or 3 sessions per day.      Use CPM for 3-4 weeks or until you are told to stop.  Continuous passive motion machine (CPM): RIGHT LEG      Use the CPM from 0 to AS TOLERATEDfor 6-8 hours per day.       You may break it up into 2 or 3 sessions per day.      Use CPM for 3-4 weeks or until you are told to stop.    Call MD / Call 911  As directed     Comments:      If you experience chest pain or shortness of breath, CALL 911 and be transported to the hospital emergency room.  If you develope a fever above 101 F, pus (white drainage) or increased drainage or redness at the wound, or calf pain, call your surgeon's office.    Change dressing  As directed     Comments:      Change dressing on THURSDAY, then change the dressing daily with sterile 4 x 4  inch gauze dressing and apply TED hose.  You may clean the incision with alcohol prior to redressing.    Constipation Prevention  As directed     Comments:      Drink plenty of fluids.  Prune juice and/or coffee may be helpful.  You may use a stool softener, such as Colace (over the counter) 100 mg twice a day.  Use MiraLax (over the counter) for constipation as needed but this may take several days to work.  Mag Citrate --OR-- Milk of Magnesia may also be used but follow directions on the label.    Diet general  As directed     Do not put a pillow under the knee. Place it under the heel.  As directed     Driving restrictions  As directed     Comments:      No driving for 6 weeks    Increase activity slowly as tolerated  As directed     Lifting restrictions  As directed     Comments:      No lifting for 6 weeks    Patient may shower  As directed     Comments:      You may shower over the brown dressing.  Once the dressing is removed you may shower without a dressing once there is no drainage.  Do not wash over the wound.  If drainage remains, cover wound with plastic wrap and then shower.    TED hose  As directed     Comments:  Use stockings (TED hose) for 2 weeks on operative leg(s).  You may remove them at night for sleeping.    Weight bearing as tolerated  As directed        DISCHARGE MEDICATIONS:     Medication List    STOP taking these medications       aspirin 325 MG tablet     DILAUDID 2 MG/ML Soln injection  Generic drug:  HYDROmorphone     ENBREL 50 MG/ML injection  Generic drug:  etanercept     Methotrexate (PF) 20 MG/0.4ML Soaj     naproxen sodium 220 MG tablet  Commonly known as:  ANAPROX      TAKE these medications       ceFAZolin 2-3 GM-% Solr  Commonly known as:  ANCEF  Inject 50 mLs (2 g total) into the vein every 8 (eight) hours.     cetaphil cream  Apply 1 application topically 2 (two) times daily as needed (for psoriasis).     cholecalciferol  400 UNITS Tabs  Commonly known as:  VITAMIN D  Take 400 Units by mouth daily.     folic acid 1 MG tablet  Commonly known as:  FOLVITE  Take 2 mg by mouth daily.     HYDROcodone-acetaminophen 5-325 MG per tablet  Commonly known as:  NORCO/VICODIN  Take 1-2 tablets by mouth every 6 (six) hours as needed for pain (breaktrhough pain).     methocarbamol 500 MG tablet  Commonly known as:  ROBAXIN  Take 1 tablet (500 mg total) by mouth every 6 (six) hours as needed.     multivitamin tablet  Take 1 tablet by mouth daily.     OxyCODONE 20 mg T12a  Commonly known as:  OXYCONTIN  Take 1 tablet (20 mg total) by mouth every 12 (twelve) hours.     rivaroxaban 10 MG Tabs tablet  Commonly known as:  XARELTO  Take 1 tablet (10 mg total) by mouth daily with supper.        FOLLOW UP VISIT:       Follow-up Information   Follow up with [facility], Claude Manges, MD. Schedule an appointment as soon as possible for a visit on 07/19/2012.   Contact information:   [address] [place] [address] [phone]       DISPOSITION:   Home  CONDITION:  Stable   PETRARCA,BRIAN 07/12/2012, 10:32 AM

## 2012-07-12 NOTE — Progress Notes (Signed)
Physical Therapy Treatment Patient Details Name: Roy [name] MRN: [id number] DOB: 1955-07-03 Today's Date: 07/12/2012 Time: 0935-1008 PT Time Calculation (min): 33 min  PT Assessment / Plan / Recommendation Comments on Treatment Session  Pt is making progress with mobility but cont's to need (A) for various functional mobility tasks but pt states his parents (in their 80's) can assist him as needed.      Follow Up Recommendations  SNF;Supervision/Assistance - 24 hour     Does the patient have the potential to tolerate intense rehabilitation     Barriers to Discharge        Equipment Recommendations  None recommended by PT    Recommendations for Other Services    Frequency Min 5X/week   Plan Discharge plan remains appropriate;Frequency remains appropriate    Precautions / Restrictions Precautions Precautions: Fall;Knee Precaution Comments: No flex  Required Braces or Orthoses: Knee Immobilizer - Right Knee Immobilizer - Right: On at all times Restrictions RLE Weight Bearing: Partial weight bearing RLE Partial Weight Bearing Percentage or Pounds: 50 Other Position/Activity Restrictions: progress to WBAT   Pertinent Vitals/Pain 2/10 Rt knee but requesting pain medication.  RN administered medication.      Mobility  Bed Mobility Bed Mobility: Supine to Sit;Sitting - Scoot to Edge of Bed Supine to Sit: 4: Min assist;HOB flat Sitting - Scoot to [place] of Bed: 4: Min assist Details for Bed Mobility Assistance: (A) for R LE management.  Encouraged pt to use hooking technique to increase independence & pt automatically states "I can't" & requests for assistance.   Transfers Transfers: Sit to Stand;Stand to Sit Sit to Stand: 3: Mod assist;With upper extremity assist;With armrests;From bed;From chair/3-in-1 Stand to Sit: 4: Min assist;With upper extremity assist;With armrests;To chair/3-in-1 Details for Transfer Assistance: pt requires verbal cues for hand placement and safety  with transfers; decreased independence due to L knee pain and decreased ROM in L knee today along with pain in R knee as well. 2+ assistance required from lower surfaces.  Ambulation/Gait Ambulation/Gait Assistance: 4: Min guard Ambulation Distance (Feet): 20 Feet Assistive device: Rolling walker Ambulation/Gait Assistance Details: cues for posture & sequencing.  Pt states he is improving with ambulation however it still appears effortful & he fatigues quickly.   Gait Pattern: Step-to pattern;Trunk flexed (decreased L knee flexion; circumducts L LE to clear foot) Stairs: No Wheelchair Mobility Wheelchair Mobility: No      PT Goals Acute Rehab PT Goals Time For Goal Achievement: 07/12/12 Potential to Achieve Goals: Fair Pt will go Supine/Side to Sit: with modified independence PT Goal: Supine/Side to Sit - Progress: Not met Pt will go Sit to Supine/Side: with modified independence Pt will go Sit to Stand: with modified independence PT Goal: Sit to Stand - Progress: Not met Pt will Ambulate: >150 feet;with modified independence;with rolling walker PT Goal: Ambulate - Progress: Progressing toward goal Pt will Go Up / Down Stairs: 3-5 stairs;with supervision;with rail(s);with least restrictive assistive device Pt will Perform Home Exercise Program: with supervision, verbal cues required/provided  Visit Information  Last PT Received On: 07/12/12 Assistance Needed: +1    Subjective Data      Cognition  Cognition Arousal/Alertness: Awake/alert Behavior During Therapy: WFL for tasks assessed/performed Overall Cognitive Status: Within Functional Limits for tasks assessed    Balance     End of Session PT - End of Session Equipment Utilized During Treatment: Gait belt;Right knee immobilizer Activity Tolerance: Patient tolerated treatment well Patient left: in chair;with call bell/phone within reach Nurse  Communication: Mobility status     Verdell Face,  [place] [phone] 07/12/2012

## 2012-07-12 NOTE — Progress Notes (Signed)
Patient ID: Roy [name], male   DOB: [birth year], 57 y.o.   MRN: [id number] PATIENT ID: Roy [name]        MRN:  [id number]          DOB/AGE: 08-26-1955 / 57 y.o.    [name], MD   [name], PA-C [address] [place], [place]  [address]                             [phone]   PROGRESS NOTE  Subjective:  negative for Chest Pain  negative for Shortness of Breath  negative for Nausea/Vomiting   negative for Calf Pain    Tolerating Diet: yes         Patient reports pain as mild.     Discussed discharge today-stable  Objective: Vital signs in last 24 hours:   Patient Vitals for the past 24 hrs:  BP Temp Pulse Resp SpO2  07/12/12 0555 136/74 mmHg 98.9 F (37.2 C) 77 16 94 %  07/11/12 2138 138/82 mmHg 98.9 F (37.2 C) 89 16 96 %  07/11/12 1600 - - - 16 96 %  07/11/12 1430 133/82 mmHg 97.9 F (36.6 C) 98 17 95 %  07/11/12 1200 - - - 16 98 %      Intake/Output from previous day:   05/19 0701 - 05/20 0700 In: 580 [P.O.:580] Out: 1370 [Urine:1370]   Intake/Output this shift:       Intake/Output     05/19 0701 - 05/20 0700 05/20 0701 - 05/21 0700   P.O. 580    Total Intake(mL/kg) 580 (6.4)    Urine (mL/kg/hr) 1370 (0.6)    Total Output 1370     Net -790             LABORATORY DATA:  Recent Labs  07/06/12 1545 07/08/12 0435 07/09/12 0436 07/10/12 0533 07/12/12 0500  WBC 6.8 8.7 8.8 9.6 12.2*  HGB 14.3 12.0* 12.8* 13.0 13.1  HCT 40.2 34.2* 38.0* 37.5* 38.0*  PLT 167 155 228 260 382    Recent Labs  07/06/12 1545 07/08/12 0435 07/09/12 0436 07/10/12 0533 07/12/12 0545  NA 135 135 139 140 135  K 3.4* 4.2 3.4* 3.7 4.2  CL 99 100 100 102 98  CO2 28 27 28 31 24   BUN 10 12 15 16 15   CREATININE 0.90 0.73 0.81 0.90 0.82  GLUCOSE 120* 124* 101* 90 88  CALCIUM 8.7 8.5 8.7 8.8 9.0   Lab Results  Component Value Date   INR 1.78* 07/07/2012   INR 1.64* 07/06/2012   INR 1.10 07/05/2012    Recent Radiographic Studies :  Dg Knee 1-2  Views Right  07/03/2012   *RADIOLOGY REPORT*  Clinical Data: Anterior right knee pain.  RIGHT KNEE - 1-2 VIEW  Comparison: None.  Findings: Cross-table lateral view demonstrates a moderate sized knee effusion.  Three-part total knee arthroplasty is present. There are is no osteolysis along the tibial tray medially and laterally, suggesting early loosening and / or particle disease. Similar changes are present around the femoral component.  IMPRESSION:  1.  No periprosthetic fracture. 2.  Moderate effusion. 3.  Lucency around the tibial tray and femoral components suggests early loosening which may be aseptic, particle disease or infection.   Original Report Authenticated By: Andreas Newport, M.D.   Dg Chest Port 1 View  07/04/2012   *RADIOLOGY REPORT*  Clinical Data: Postop, shortness of breath  PORTABLE CHEST - 1 VIEW  Comparison: 05/07/2011  Findings: Patchy airspace opacity left lung.  Hypoaeration with interstitial and vascular crowding.  Cardiomediastinal prominence is likely accentuated by hypoaeration.  No pneumothorax or definite pleural effusion.  Multilevel degenerative changes.  IMPRESSION: Patchy airspace opacity left lung may reflect asymmetric edema, pneumonia, or aspiration in the appropriate clinical setting.   Original Report Authenticated By: Jearld Lesch, M.D.     Examination:  General appearance: alert, cooperative and no distress  Wound Exam: clean, dry, intact   Drainage:  None: wound tissue dry  Motor Exam: EHL, FHL, Anterior Tibial and Posterior Tibial Intact  Sensory Exam: Superficial Peroneal, Deep Peroneal and Tibial normal  Vascular Exam: Normal  Assessment:    5 Days Post-Op  Procedure(s) (LRB): EXCISIONAL TOTAL KNEE ARTHROPLASTY WITH ANTIBIOTIC SPACERS (Right)  ADDITIONAL DIAGNOSIS:  Principal Problem:   Infection of total right knee replacement Active Problems:   Psoriatic arthritis, destructive type     Plan: Physical Therapy as ordered Weight  Bearing as Tolerated (WBAT)  DVT Prophylaxis:  Xarelto  DISCHARGE PLAN: Home  DISCHARGE NEEDS: HHPT,home health nurse to administer IV antibiotic   comfortable,stable, will D/C today, office in 10-14 days      [name] W 07/12/2012 10:19 AM

## 2012-07-18 NOTE — Progress Notes (Signed)
Patient decided to go home with [facility]. Clinical Social Worker will sign off for now as social work intervention is no longer needed. Please consult [place] again if new need arises.   Sabino Niemann, [phone]

## 2012-07-28 ENCOUNTER — Encounter: Payer: Self-pay | Admitting: Infectious Diseases

## 2012-08-03 ENCOUNTER — Encounter: Payer: Self-pay | Admitting: Infectious Diseases

## 2012-08-03 ENCOUNTER — Ambulatory Visit (INDEPENDENT_AMBULATORY_CARE_PROVIDER_SITE_OTHER): Payer: BC Managed Care – PPO | Admitting: Infectious Diseases

## 2012-08-03 VITALS — BP 117/84 | HR 88 | Temp 98.9°F | Ht 66.0 in | Wt 184.0 lb

## 2012-08-03 DIAGNOSIS — L4050 Arthropathic psoriasis, unspecified: Secondary | ICD-10-CM

## 2012-08-03 DIAGNOSIS — M869 Osteomyelitis, unspecified: Secondary | ICD-10-CM

## 2012-08-03 DIAGNOSIS — L4052 Psoriatic arthritis mutilans: Secondary | ICD-10-CM

## 2012-08-03 DIAGNOSIS — T8453XD Infection and inflammatory reaction due to internal right knee prosthesis, subsequent encounter: Secondary | ICD-10-CM

## 2012-08-03 DIAGNOSIS — Z5189 Encounter for other specified aftercare: Secondary | ICD-10-CM

## 2012-08-03 NOTE — Progress Notes (Signed)
  Subjective:    Patient ID: Roy [name], male    DOB: 1955/05/30, 57 y.o.   MRN: [id number]  HPI 57 yo M with hx of psoriatic arthritis (on enbrel and MTX) and previous R TKR in 2002. Adm on 5-12 with acute onset of swelling of his R knee, he underwent arthrocentesis showing 100,000 WBC and GPC in pairs. Cx grew MSSA. He underwent I & D of TKR on 5-12. He developed fever post-operatively. 5-15 he underwent removal of TKR with placement of anbx spacer.  By 5-20 he was doing well enough to be d/c to home- anbx stop date set as 08-18-12. Has some pain in his knee (1/10). Wound is closing well. No f/c. No problems with PIC line. Has had some constipation.  Has had mild arthritis flare since he had surgery. Off MTX and embrel.   ESR 46 and CRP 7.7 (07-09-12)  Review of Systems  Constitutional: Negative for fever, chills, appetite change and unexpected weight change.  HENT: Negative for mouth sores.   Gastrointestinal: Positive for constipation. Negative for diarrhea.  Genitourinary: Negative for difficulty urinating.  Musculoskeletal: Positive for arthralgias.       Objective:   Physical Exam  Constitutional: He appears well-developed and well-nourished.  HENT:  Mouth/Throat: No oropharyngeal exudate.  Eyes: EOM are normal. Pupils are equal, round, and reactive to light.  Neck: Neck supple.  Cardiovascular: Normal rate, regular rhythm and normal heart sounds.   Pulmonary/Chest: Effort normal and breath sounds normal.  Abdominal: Soft. Bowel sounds are normal. There is no tenderness.  Musculoskeletal:       Arms:      Legs: Lymphadenopathy:    He has no cervical adenopathy.          Assessment & Plan:

## 2012-08-03 NOTE — Assessment & Plan Note (Signed)
Will continue his current anbx. Will have advance check his ESR and CRP at his next blood draw as surrogate markers for his infection. Will have home health pull his PIC as planned after dose of anbx on 6-26 ( I wrote a rx to them to confirm this). Will see him back in ~ 3 weeks.

## 2012-08-03 NOTE — Assessment & Plan Note (Signed)
He feels this is stable. He will try to avoid MTX and biologics/immunomodulators in the future.

## 2012-08-03 NOTE — Assessment & Plan Note (Signed)
See above notes under infection of TKR

## 2012-08-11 ENCOUNTER — Telehealth: Payer: Self-pay | Admitting: *Deleted

## 2012-08-11 NOTE — Telephone Encounter (Signed)
[facility] called to notify us of a critical low - glucose = 47.  Labs were drawn 08/09/12 but resulted 08/11/12.  RN called patient to check on him.  Pt endorses low-to-no appetite since surgery and a fasting blood draw on Monday morning.  Pt's intake consists primarily of fruit throughout the day, and is trying to incorporate yogurt daily.  Pt advised to eat frequent, small meals during the day with lean proteins to keep his blood sugar constant. Pt verbalized understanding.  Pt states he is feeling well otherwise and knows to call the office if he is having any trouble or has any questions. Andree Coss, RN

## 2012-08-14 NOTE — Telephone Encounter (Signed)
Thanks, consider repeating Glc.

## 2012-08-18 NOTE — Telephone Encounter (Signed)
Most recent glucose was 66.

## 2012-08-22 ENCOUNTER — Encounter: Payer: Self-pay | Admitting: Infectious Diseases

## 2012-09-02 ENCOUNTER — Encounter: Payer: Self-pay | Admitting: Infectious Diseases

## 2012-09-02 ENCOUNTER — Ambulatory Visit (INDEPENDENT_AMBULATORY_CARE_PROVIDER_SITE_OTHER): Payer: BC Managed Care – PPO | Admitting: Infectious Diseases

## 2012-09-02 VITALS — BP 122/80 | HR 76 | Temp 98.2°F

## 2012-09-02 DIAGNOSIS — M869 Osteomyelitis, unspecified: Secondary | ICD-10-CM

## 2012-09-02 LAB — SEDIMENTATION RATE: Sed Rate: 7 mm/hr (ref 0–16)

## 2012-09-02 LAB — C-REACTIVE PROTEIN: CRP: 1.5 mg/dL — ABNORMAL HIGH (ref <0.60)

## 2012-09-02 MED ORDER — CEPHALEXIN 500 MG PO CAPS: 500.0000 mg | ORAL_CAPSULE | Freq: Two times a day (BID) | ORAL | Status: DC

## 2012-09-02 NOTE — Assessment & Plan Note (Signed)
He is doing very well. Well repeat his ESR and CRP. His previous were elevated (especially the CRP). We spoke about how it is hard to know if this is due to his psoriatic arthritis or is a remnant of his infection. Will start him on keflex as well. Would anticipate he will take this until surgery. I would suggest that he can proceed with re-implantation.

## 2012-09-02 NOTE — Progress Notes (Signed)
  Subjective:    Patient ID: Sandra Cockayne, male    DOB: [birth year], 57 y.o.   MRN: [id number]  HPI 57 yo M with hx of psoriatic arthritis (on enbrel and MTX) and previous R TKR in 2002. Adm on 07-04-12 with acute onset of swelling of his R knee, he underwent arthrocentesis showing 100,000 WBC and GPC in pairs. Cx grew MSSA. He underwent I & D of TKR on 5-12. He developed fever post-operatively. 5-15 he underwent removal of TKR with placement of anbx spacer. By 5-20 he was doing well enough to be d/c to home- anbx stop date 08-18-12. Has been off anbx since.  He had f/u ESR (34) and CRP (49) on 08-10-12. Today is tired of walking with a walker. Is walking and "favoring" his R leg.  Has had improved appetite since off anbx. Is scheduled to have f/u with Dr Cleophas Dunker on 09-05-12.    Review of Systems  Constitutional: Negative for fever and chills.       Objective:   Physical Exam  Constitutional: He appears well-developed and well-nourished.  Musculoskeletal:       Legs:         Assessment & Plan:

## 2012-09-05 NOTE — H&P (Signed)
Roy Campbell, MD   Roy Code, PA-C [address], [place], [place]  [address]                             [phone]   ORTHOPAEDIC HISTORY & PHYSICAL  Roy [name] MRN:  [id number] DOB/SEX:  [birth year]/male  CHIEF COMPLAINT:  Painful right knee  HISTORY: Roy [name] is approximately 6 weeks status post I&D of an infected right total knee replacement and insertion of an antibiotic spacer.   He has been followed by the ID Department, specifically Dr. Ninetta [name] for followup antibiotic treatment.  He did grow MSSE, he was in IV cephalexin.  He recently saw Dr. Ninetta [name] last week who has approved "the next step" according to Roy [name], so he is actually ready for the revision arthroplasty.  He has done very well in the meantime, does not have any fevers or chills.  Has been cleared for reimplantation bu Dr Roy [name].  PAST MEDICAL HISTORY: Patient Active Problem List   Diagnosis Date Noted  . Osteomyelitis of knee region 08/03/2012  . 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 Drug addict     Clean for 20 years  . Psoriatic arthritis   . Seasonal allergies   . Arthritis    Past Surgical History  Procedure Laterality Date  . Knee surgery    . Total hip arthroplasty    . Orif tibia fracture    . Knee arthroscopy Right 07/04/2012    Procedure: ARTHROSCOPY I&D KNEE;  Surgeon: Nadara Mustard, MD;  Location: [facility] OR;  Service: Orthopedics;  Laterality: Right;  . Excisional total knee arthroplasty with antibiotic spacers Right 07/07/2012    Procedure: EXCISIONAL TOTAL KNEE ARTHROPLASTY WITH ANTIBIOTIC SPACERS;  Surgeon: Valeria Batman, MD;  Location: [facility] OR;  Service: Orthopedics;  Laterality: Right;  Irrigation and Debridement Right Total Knee Replacement, Insertion of ABX Spacer     MEDICATIONS:   Prescriptions prior to admission  Medication Sig Dispense Refill  . cephALEXin (KEFLEX) 500 MG capsule Take 500 mg by mouth 2  (two) times daily. Continuous course started 09/03/12 - until stopped by doctor -      . cholecalciferol (VITAMIN D) 400 UNITS TABS Take 400 Units by mouth daily.       Marland Kitchen ibuprofen (ADVIL,MOTRIN) 200 MG tablet Take 400 mg by mouth 2 (two) times daily as needed for pain.      . Multiple Vitamin (MULTIVITAMIN WITH MINERALS) TABS Take 1 tablet by mouth daily.      . Skin Protectants, Misc. (EUCERIN) cream Apply 1 application topically 3 (three) times daily.        ALLERGIES:   Allergies  Allergen Reactions  . Demerol (Meperidine) Nausea And Vomiting  . Morphine And Related Nausea And Vomiting  . Oxycodone     Patient preference (History of drug abuse)    REVIEW OF SYSTEMS:  14 point review of systems is negative except for some disturbed sleep pattern, some fatigue, morning stiffness, nocturnal pain, trouble negotiating stairs, chairs, car, toilet as well as tops, buttons, cutlery and writing.    FAMILY HISTORY:  History reviewed. No pertinent family history.  SOCIAL HISTORY:   History  Substance Use Topics  . Smoking status: Former Smoker    Types: Cigarettes    Quit date: 07/05/1992  . Smokeless tobacco: Never Used  . Alcohol Use: No  Comment: recovering addict      EXAMINATION: Vital signs in last 24 hours: Temp:  [97.3 F (36.3 C)] 97.3 F (36.3 C) (07/22 0607) Pulse Rate:  [73] 73 (07/22 0607) Resp:  [18] 18 (07/22 0607) BP: (139)/(89) 139/89 mmHg (07/22 0607) SpO2:  [95 %] 95 % (07/22 0607)  Head is normocephalic.   Eyes:  Pupils equal, round and reactive to light and accommodation.  Extraocular intact. ENT: Ears, nose, and throat were benign.   Neck: supple, no bruits were noted.   Chest: good expansion.   Lungs: essentially clear.   Cardiac: regular rhythm and rate, normal S1, S2.  No murmurs appreciated. Pulses :  1+ bilateral and symmetric in lower extremities. Abdomen is scaphoid, soft, nontender, no masses palpable, normal bowel sounds present. CNS:  He  is oriented x3 and cranial nerves II-XII grossly intact. Breast, rectal, and genital exams: not performed and not indicated for an orthopedic evaluation. Musculoskeletal: He was able to straight leg raise with a slight extensor lag but has very minimal knee flexion, only probably 30 degrees.  He only had about 60 degrees preop   ASSESSMENT:  S/P I&D right total knee replacement with removal of prosthesis and placement of antibiotic spacer  Past Medical History  Diagnosis Date  . Drug addict     Clean for 20 years  . Psoriatic arthritis   . Seasonal allergies   . Arthritis     PLAN: Plan for right total knee revision  The procedure,  risks, and benefits of total knee arthroplasty were presented and reviewed. The risks including but not limited to infection, blood clots, vascular and nerve injury, stiffness,  among others were discussed. The patient acknowledged the explanation, agreed to proceed.   Roy [name] 09/13/2012, 7:35 AM

## 2012-09-08 ENCOUNTER — Encounter (HOSPITAL_COMMUNITY): Payer: Self-pay | Admitting: Pharmacy Technician

## 2012-09-09 ENCOUNTER — Encounter (HOSPITAL_COMMUNITY): Payer: Self-pay

## 2012-09-09 ENCOUNTER — Ambulatory Visit (HOSPITAL_COMMUNITY)
Admission: RE | Admit: 2012-09-09 | Discharge: 2012-09-09 | Disposition: A | Discharge status: Home / Self Care | Payer: BC Managed Care – PPO | Source: Ambulatory Visit | Attending: Orthopedic Surgery | Admitting: Orthopedic Surgery

## 2012-09-09 ENCOUNTER — Encounter (HOSPITAL_COMMUNITY)
Admission: RE | Admit: 2012-09-09 | Discharge: 2012-09-09 | Disposition: A | Discharge status: Home / Self Care | Payer: BC Managed Care – PPO | Source: Ambulatory Visit | Attending: Orthopaedic Surgery | Admitting: Orthopaedic Surgery

## 2012-09-09 DIAGNOSIS — Z01818 Encounter for other preprocedural examination: Secondary | ICD-10-CM | POA: Insufficient documentation

## 2012-09-09 DIAGNOSIS — Z01810 Encounter for preprocedural cardiovascular examination: Secondary | ICD-10-CM | POA: Insufficient documentation

## 2012-09-09 DIAGNOSIS — Z01812 Encounter for preprocedural laboratory examination: Secondary | ICD-10-CM | POA: Insufficient documentation

## 2012-09-09 HISTORY — DX: Unspecified osteoarthritis, unspecified site: M19.90

## 2012-09-09 HISTORY — DX: Other seasonal allergic rhinitis: J30.2

## 2012-09-09 LAB — COMPREHENSIVE METABOLIC PANEL
ALT: 17 U/L (ref 0–53)
AST: 17 U/L (ref 0–37)
Albumin: 4 g/dL (ref 3.5–5.2)
Alkaline Phosphatase: 99 U/L (ref 39–117)
BUN: 16 mg/dL (ref 6–23)
CO2: 27 mEq/L (ref 19–32)
Calcium: 9.8 mg/dL (ref 8.4–10.5)
Chloride: 100 mEq/L (ref 96–112)
Creatinine, Ser: 0.69 mg/dL (ref 0.50–1.35)
GFR calc Af Amer: >90 mL/min (ref >90)
GFR calc non Af Amer: >90 mL/min (ref >90)
Glucose, Bld: 90 mg/dL (ref 70–99)
Potassium: 4 mEq/L (ref 3.5–5.1)
Sodium: 137 mEq/L (ref 135–145)
Total Bilirubin: 0.3 mg/dL (ref 0.3–1.2)
Total Protein: 7.5 g/dL (ref 6.0–8.3)

## 2012-09-09 LAB — URINALYSIS, ROUTINE W REFLEX MICROSCOPIC

## 2012-09-09 LAB — CBC
HCT: 44.7 % (ref 39.0–52.0)
Hemoglobin: 15.2 g/dL (ref 13.0–17.0)
MCH: 26.8 pg (ref 26.0–34.0)
MCHC: 34 g/dL (ref 30.0–36.0)
MCV: 78.7 fL (ref 78.0–100.0)
Platelets: 300 10*3/uL (ref 150–400)
RBC: 5.68 MIL/uL (ref 4.22–5.81)
RDW: 13.1 % (ref 11.5–15.5)
WBC: 8 10*3/uL (ref 4.0–10.5)

## 2012-09-09 LAB — TYPE AND SCREEN
ABO/RH(D): B POS
Antibody Screen: NEGATIVE

## 2012-09-09 LAB — APTT: aPTT: 26 seconds (ref 24–37)

## 2012-09-09 LAB — SURGICAL PCR SCREEN
MRSA, PCR: NEGATIVE
Staphylococcus aureus: NEGATIVE

## 2012-09-09 LAB — PROTIME-INR
INR: 0.91 (ref 0.00–1.49)
Prothrombin Time: 12.1 seconds (ref 11.6–15.2)

## 2012-09-09 MED ORDER — CHLORHEXIDINE GLUCONATE 4 % EX LIQD: 60.0000 mL | Freq: Once | CUTANEOUS | Status: DC

## 2012-09-09 NOTE — Pre-Procedure Instructions (Signed)
WITTEN CERTAIN  09/09/2012   Your procedure is scheduled on:  Tuesday September 13, 2012  Report to [facility] at 0530 AM.  Call this number if you have problems the morning of surgery: [phone]   Remember:   Do not eat food or drink liquids after midnight.   Take these medicines the morning of surgery with A SIP OF WATER: None   Do not wear jewelry.  Do not wear lotions, powders, or perfumes. You may wear deodorant.  Men may shave face and neck.  Do not bring valuables to the hospital.  [facility] is not responsible for any belongings or valuables.  Contacts, dentures or bridgework may not be worn into surgery.  Leave suitcase in the car. After surgery it may be brought to your room.  For patients admitted to the hospital, checkout time is 11:00 AM the day of  discharge.   Patients discharged the day of surgery will not be allowed to drive  home.   Special Instructions: Shower using CHG 2 nights before surgery and the night before surgery.  If you shower the day of surgery use CHG.  Use special wash - you have one bottle of CHG for all showers.  You should use approximately 1/3 of the bottle for each shower.   Please read over the following fact sheets that you were given: MRSA Information and Surgical Site Infection Prevention

## 2012-09-12 HISTORY — PX: TOTAL KNEE ARTHROPLASTY: SHX125

## 2012-09-12 MED ORDER — DEXTROSE 5 % IV SOLN
3.0000 g | INTRAVENOUS | Status: AC
  Administered 2012-09-13: 3 g via INTRAVENOUS
  Filled 2012-09-12: qty 3000

## 2012-09-13 ENCOUNTER — Encounter (HOSPITAL_COMMUNITY)
Admission: RE | Disposition: A | Discharge status: Home Health | Payer: Self-pay | Source: Ambulatory Visit | Attending: Orthopaedic Surgery

## 2012-09-13 ENCOUNTER — Encounter (HOSPITAL_COMMUNITY): Payer: Self-pay | Admitting: *Deleted

## 2012-09-13 ENCOUNTER — Ambulatory Visit (HOSPITAL_COMMUNITY): Payer: BC Managed Care – PPO

## 2012-09-13 ENCOUNTER — Encounter (HOSPITAL_COMMUNITY): Payer: Self-pay | Admitting: Anesthesiology

## 2012-09-13 ENCOUNTER — Ambulatory Visit (HOSPITAL_COMMUNITY): Payer: BC Managed Care – PPO | Admitting: Anesthesiology

## 2012-09-13 DIAGNOSIS — Z96659 Presence of unspecified artificial knee joint: Secondary | ICD-10-CM

## 2012-09-13 DIAGNOSIS — Z89529 Acquired absence of unspecified knee: Secondary | ICD-10-CM

## 2012-09-13 DIAGNOSIS — D62 Acute posthemorrhagic anemia: Secondary | ICD-10-CM | POA: Diagnosis not present

## 2012-09-13 DIAGNOSIS — M869 Osteomyelitis, unspecified: Secondary | ICD-10-CM | POA: Diagnosis present

## 2012-09-13 DIAGNOSIS — T8453XD Infection and inflammatory reaction due to internal right knee prosthesis, subsequent encounter: Secondary | ICD-10-CM

## 2012-09-13 DIAGNOSIS — L4050 Arthropathic psoriasis, unspecified: Secondary | ICD-10-CM | POA: Diagnosis present

## 2012-09-13 DIAGNOSIS — Y831 Surgical operation with implant of artificial internal device as the cause of abnormal reaction of the patient, or of later complication, without mention of misadventure at the time of the procedure: Secondary | ICD-10-CM | POA: Diagnosis present

## 2012-09-13 DIAGNOSIS — Z87891 Personal history of nicotine dependence: Secondary | ICD-10-CM

## 2012-09-13 DIAGNOSIS — M21869 Other specified acquired deformities of unspecified lower leg: Principal | ICD-10-CM | POA: Diagnosis present

## 2012-09-13 DIAGNOSIS — Z8614 Personal history of Methicillin resistant Staphylococcus aureus infection: Secondary | ICD-10-CM

## 2012-09-13 HISTORY — PX: TOTAL KNEE REVISION: SHX996

## 2012-09-13 LAB — URINALYSIS, ROUTINE W REFLEX MICROSCOPIC
Bilirubin Urine: NEGATIVE
Glucose, UA: NEGATIVE mg/dL
Hgb urine dipstick: NEGATIVE
Ketones, ur: NEGATIVE mg/dL
Leukocytes, UA: NEGATIVE
Nitrite: NEGATIVE
Protein, ur: NEGATIVE mg/dL
Specific Gravity, Urine: 1.026 (ref 1.005–1.030)
Urobilinogen, UA: 0.2 mg/dL (ref 0.0–1.0)
pH: 6 (ref 5.0–8.0)

## 2012-09-13 SURGERY — TOTAL KNEE REVISION
Anesthesia: General | Site: Knee | Laterality: Right | Wound class: Clean

## 2012-09-13 MED ORDER — THROMBIN 20000 UNITS EX KIT
PACK | CUTANEOUS | Status: DC | PRN
  Administered 2012-09-13: 20000 [IU] via TOPICAL

## 2012-09-13 MED ORDER — ALUM & MAG HYDROXIDE-SIMETH 200-200-20 MG/5ML PO SUSP: 30.0000 mL | ORAL | Status: DC | PRN

## 2012-09-13 MED ORDER — ROCURONIUM BROMIDE 100 MG/10ML IV SOLN
INTRAVENOUS | Status: DC | PRN
  Administered 2012-09-13: 50 mg via INTRAVENOUS

## 2012-09-13 MED ORDER — ACETAMINOPHEN 10 MG/ML IV SOLN
INTRAVENOUS | Status: AC
  Filled 2012-09-13: qty 100

## 2012-09-13 MED ORDER — LABETALOL HCL 5 MG/ML IV SOLN
INTRAVENOUS | Status: DC | PRN
  Administered 2012-09-13 (×2): 5 mg via INTRAVENOUS

## 2012-09-13 MED ORDER — METOCLOPRAMIDE HCL 10 MG PO TABS: 5.0000 mg | ORAL_TABLET | Freq: Three times a day (TID) | ORAL | Status: DC | PRN

## 2012-09-13 MED ORDER — HYDROMORPHONE HCL PF 1 MG/ML IJ SOLN
INTRAMUSCULAR | Status: AC
  Filled 2012-09-13: qty 1

## 2012-09-13 MED ORDER — SODIUM CHLORIDE 0.9 % IV SOLN
75.0000 mL/h | INTRAVENOUS | Status: DC
  Administered 2012-09-13: 75 mL/h via INTRAVENOUS

## 2012-09-13 MED ORDER — RIVAROXABAN 10 MG PO TABS
10.0000 mg | ORAL_TABLET | ORAL | Status: DC
  Administered 2012-09-13 – 2012-09-15 (×3): 10 mg via ORAL
  Filled 2012-09-13 (×4): qty 1

## 2012-09-13 MED ORDER — BUPIVACAINE-EPINEPHRINE PF 0.25-1:200000 % IJ SOLN
INTRAMUSCULAR | Status: DC | PRN
  Administered 2012-09-13: 30 mL

## 2012-09-13 MED ORDER — NEOSTIGMINE METHYLSULFATE 1 MG/ML IJ SOLN
INTRAMUSCULAR | Status: DC | PRN
  Administered 2012-09-13: 3 mg via INTRAVENOUS

## 2012-09-13 MED ORDER — HYDROMORPHONE HCL PF 1 MG/ML IJ SOLN
0.2500 mg | INTRAMUSCULAR | Status: DC | PRN
  Administered 2012-09-13 (×4): 0.5 mg via INTRAVENOUS

## 2012-09-13 MED ORDER — MIDAZOLAM HCL 5 MG/5ML IJ SOLN
INTRAMUSCULAR | Status: DC | PRN
  Administered 2012-09-13: 2 mg via INTRAVENOUS

## 2012-09-13 MED ORDER — VANCOMYCIN HCL 1000 MG IV SOLR
INTRAVENOUS | Status: AC
  Filled 2012-09-13: qty 3000

## 2012-09-13 MED ORDER — LIDOCAINE HCL (CARDIAC) 20 MG/ML IV SOLN
INTRAVENOUS | Status: DC | PRN
  Administered 2012-09-13: 30 mg via INTRAVENOUS

## 2012-09-13 MED ORDER — FENTANYL CITRATE 0.05 MG/ML IJ SOLN
INTRAMUSCULAR | Status: DC | PRN
  Administered 2012-09-13: 100 ug via INTRAVENOUS
  Administered 2012-09-13 (×3): 50 ug via INTRAVENOUS
  Administered 2012-09-13: 100 ug via INTRAVENOUS
  Administered 2012-09-13 (×3): 50 ug via INTRAVENOUS

## 2012-09-13 MED ORDER — BUPIVACAINE-EPINEPHRINE PF 0.25-1:200000 % IJ SOLN
INTRAMUSCULAR | Status: AC
  Filled 2012-09-13: qty 30

## 2012-09-13 MED ORDER — ONDANSETRON HCL 4 MG/2ML IJ SOLN
INTRAMUSCULAR | Status: DC | PRN
  Administered 2012-09-13: 4 mg via INTRAVENOUS

## 2012-09-13 MED ORDER — EUCERIN EX CREA: 1.0000 "application " | TOPICAL_CREAM | Freq: Three times a day (TID) | CUTANEOUS | Status: DC

## 2012-09-13 MED ORDER — BUPIVACAINE-EPINEPHRINE PF 0.5-1:200000 % IJ SOLN
INTRAMUSCULAR | Status: AC
  Filled 2012-09-13: qty 30

## 2012-09-13 MED ORDER — METOCLOPRAMIDE HCL 5 MG/ML IJ SOLN
5.0000 mg | Freq: Three times a day (TID) | INTRAMUSCULAR | Status: DC | PRN
  Administered 2012-09-13 – 2012-09-15 (×4): 10 mg via INTRAVENOUS
  Filled 2012-09-13 (×4): qty 2

## 2012-09-13 MED ORDER — CHLORHEXIDINE GLUCONATE 4 % EX LIQD: 60.0000 mL | Freq: Every day | CUTANEOUS | Status: DC

## 2012-09-13 MED ORDER — THROMBIN 20000 UNITS EX KIT
PACK | CUTANEOUS | Status: AC
  Filled 2012-09-13: qty 1

## 2012-09-13 MED ORDER — CEFAZOLIN SODIUM-DEXTROSE 2-3 GM-% IV SOLR
2.0000 g | Freq: Four times a day (QID) | INTRAVENOUS | Status: AC
  Administered 2012-09-13 (×2): 2 g via INTRAVENOUS
  Filled 2012-09-13 (×2): qty 50

## 2012-09-13 MED ORDER — ARTIFICIAL TEARS OP OINT
TOPICAL_OINTMENT | OPHTHALMIC | Status: DC | PRN
  Administered 2012-09-13: 1 via OPHTHALMIC

## 2012-09-13 MED ORDER — HYDROMORPHONE HCL PF 1 MG/ML IJ SOLN
1.0000 mg | INTRAMUSCULAR | Status: DC | PRN
  Administered 2012-09-13 – 2012-09-15 (×10): 1 mg via INTRAVENOUS
  Filled 2012-09-13 (×10): qty 1

## 2012-09-13 MED ORDER — METHOCARBAMOL 100 MG/ML IJ SOLN
500.0000 mg | Freq: Four times a day (QID) | INTRAVENOUS | Status: DC | PRN
  Filled 2012-09-13: qty 5

## 2012-09-13 MED ORDER — SODIUM CHLORIDE 0.9 % IR SOLN
Status: DC | PRN
  Administered 2012-09-13: 3000 mL

## 2012-09-13 MED ORDER — ONDANSETRON HCL 4 MG/2ML IJ SOLN: 4.0000 mg | Freq: Once | INTRAMUSCULAR | Status: DC | PRN

## 2012-09-13 MED ORDER — DOCUSATE SODIUM 100 MG PO CAPS
100.0000 mg | ORAL_CAPSULE | Freq: Two times a day (BID) | ORAL | Status: DC
  Administered 2012-09-14 – 2012-09-16 (×5): 100 mg via ORAL
  Filled 2012-09-13 (×8): qty 1

## 2012-09-13 MED ORDER — MENTHOL 3 MG MT LOZG: 1.0000 | LOZENGE | OROMUCOSAL | Status: DC | PRN

## 2012-09-13 MED ORDER — ONDANSETRON HCL 4 MG/2ML IJ SOLN
INTRAMUSCULAR | Status: AC
  Filled 2012-09-13: qty 2

## 2012-09-13 MED ORDER — VANCOMYCIN HCL 1000 MG IV SOLR
INTRAVENOUS | Status: DC | PRN
  Administered 2012-09-13: 5 g

## 2012-09-13 MED ORDER — GLYCOPYRROLATE 0.2 MG/ML IJ SOLN
INTRAMUSCULAR | Status: DC | PRN
  Administered 2012-09-13: 0.4 mg via INTRAVENOUS

## 2012-09-13 MED ORDER — SODIUM CHLORIDE 0.9 % IV SOLN: INTRAVENOUS | Status: DC

## 2012-09-13 MED ORDER — ACETAMINOPHEN 10 MG/ML IV SOLN
1000.0000 mg | Freq: Once | INTRAVENOUS | Status: AC
  Administered 2012-09-13: 1000 mg via INTRAVENOUS

## 2012-09-13 MED ORDER — HYDROCERIN EX CREA
TOPICAL_CREAM | Freq: Three times a day (TID) | CUTANEOUS | Status: DC
  Administered 2012-09-14 – 2012-09-16 (×7): via TOPICAL
  Filled 2012-09-13 (×3): qty 113

## 2012-09-13 MED ORDER — ACETAMINOPHEN 10 MG/ML IV SOLN
1000.0000 mg | Freq: Four times a day (QID) | INTRAVENOUS | Status: AC
  Administered 2012-09-13 – 2012-09-14 (×4): 1000 mg via INTRAVENOUS
  Filled 2012-09-13 (×4): qty 100

## 2012-09-13 MED ORDER — KETOROLAC TROMETHAMINE 15 MG/ML IJ SOLN
15.0000 mg | Freq: Four times a day (QID) | INTRAMUSCULAR | Status: AC
  Administered 2012-09-13 (×2): 15 mg via INTRAVENOUS
  Filled 2012-09-13 (×2): qty 1

## 2012-09-13 MED ORDER — ONDANSETRON HCL 4 MG PO TABS: 4.0000 mg | ORAL_TABLET | Freq: Four times a day (QID) | ORAL | Status: DC | PRN

## 2012-09-13 MED ORDER — METHOCARBAMOL 500 MG PO TABS
500.0000 mg | ORAL_TABLET | Freq: Four times a day (QID) | ORAL | Status: DC | PRN
  Administered 2012-09-14 – 2012-09-16 (×5): 500 mg via ORAL
  Filled 2012-09-13 (×5): qty 1

## 2012-09-13 MED ORDER — ONDANSETRON HCL 4 MG/2ML IJ SOLN
4.0000 mg | Freq: Four times a day (QID) | INTRAMUSCULAR | Status: DC | PRN
  Administered 2012-09-13 – 2012-09-14 (×4): 4 mg via INTRAVENOUS
  Filled 2012-09-13 (×3): qty 2

## 2012-09-13 MED ORDER — PROPOFOL 10 MG/ML IV BOLUS
INTRAVENOUS | Status: DC | PRN
  Administered 2012-09-13: 130 mg via INTRAVENOUS
  Administered 2012-09-13: 30 mg via INTRAVENOUS
  Administered 2012-09-13: 40 mg via INTRAVENOUS

## 2012-09-13 MED ORDER — PHENOL 1.4 % MT LIQD: 1.0000 | OROMUCOSAL | Status: DC | PRN

## 2012-09-13 MED ORDER — LACTATED RINGERS IV SOLN
INTRAVENOUS | Status: DC | PRN
  Administered 2012-09-13 (×3): via INTRAVENOUS

## 2012-09-13 MED ORDER — HYDROMORPHONE HCL 2 MG PO TABS
2.0000 mg | ORAL_TABLET | ORAL | Status: DC | PRN
  Administered 2012-09-14 – 2012-09-15 (×5): 2 mg via ORAL
  Administered 2012-09-15 (×2): 4 mg via ORAL
  Administered 2012-09-15: 2 mg via ORAL
  Administered 2012-09-15: 4 mg via ORAL
  Administered 2012-09-16: 2 mg via ORAL
  Administered 2012-09-16: 4 mg via ORAL
  Filled 2012-09-13 (×3): qty 1
  Filled 2012-09-13: qty 2
  Filled 2012-09-13: qty 1
  Filled 2012-09-13 (×2): qty 2
  Filled 2012-09-13: qty 1
  Filled 2012-09-13 (×2): qty 2
  Filled 2012-09-13: qty 1
  Filled 2012-09-13: qty 2

## 2012-09-13 SURGICAL SUPPLY — 68 items
ADAPTER BOLT FEMORAL +2/-2 (Knees) ×2 IMPLANT
AUGMENT FMRL PST PFC SIG SZ4 8 (Orthopedic Implant) ×1 IMPLANT
AUGMENT POSTEERIOR PFC SZ4 RT (Knees) ×2 IMPLANT
AUGMENT POSTERIOR PFC SZ4 4MM (Knees) ×1 IMPLANT
BANDAGE ESMARK 6X9 LF (GAUZE/BANDAGES/DRESSINGS) ×1 IMPLANT
BLADE SAGITTAL 25.0X1.19X90 (BLADE) ×2 IMPLANT
BLADE SAW SGTL 13.0X1.19X90.0M (BLADE) ×2 IMPLANT
BNDG ESMARK 6X9 LF (GAUZE/BANDAGES/DRESSINGS) ×2
BOWL SMART MIX CTS (DISPOSABLE) IMPLANT
CEMENT HV SMART SET (Cement) ×10 IMPLANT
CEMENT RESTRICTOR DEPUY SZ 4 (Cement) ×2 IMPLANT
CLOTH BEACON ORANGE TIMEOUT ST (SAFETY) ×2 IMPLANT
COMP FEM CEM RT SZ4 (Orthopedic Implant) ×2 IMPLANT
COMPONENT FEM CEM RT SZ4 (Orthopedic Implant) ×1 IMPLANT
COVER SURGICAL LIGHT HANDLE (MISCELLANEOUS) ×2 IMPLANT
CUFF TOURNIQUET SINGLE 34IN LL (TOURNIQUET CUFF) IMPLANT
CUFF TOURNIQUET SINGLE 44IN (TOURNIQUET CUFF) IMPLANT
DRAPE EXTREMITY T 121X128X90 (DRAPE) ×2 IMPLANT
DRSG ADAPTIC 3X8 NADH LF (GAUZE/BANDAGES/DRESSINGS) ×2 IMPLANT
DRSG PAD ABDOMINAL 8X10 ST (GAUZE/BANDAGES/DRESSINGS) ×2 IMPLANT
DURAPREP 26ML APPLICATOR (WOUND CARE) ×2 IMPLANT
ELECT REM PT RETURN 9FT ADLT (ELECTROSURGICAL) ×2
ELECTRODE REM PT RTRN 9FT ADLT (ELECTROSURGICAL) ×1 IMPLANT
EVACUATOR 1/8 PVC DRAIN (DRAIN) IMPLANT
FACESHIELD LNG OPTICON STERILE (SAFETY) ×4 IMPLANT
FEM POST AUG PFC SIGMA SZ4 8MM (Orthopedic Implant) ×2 IMPLANT
FEMORAL ADAPTER (Orthopedic Implant) ×2 IMPLANT
GLOVE BIOGEL PI IND STRL 8 (GLOVE) ×1 IMPLANT
GLOVE BIOGEL PI IND STRL 8.5 (GLOVE) IMPLANT
GLOVE BIOGEL PI INDICATOR 8 (GLOVE) ×1
GLOVE BIOGEL PI INDICATOR 8.5 (GLOVE)
GLOVE ECLIPSE 8.0 STRL XLNG CF (GLOVE) ×2 IMPLANT
GLOVE SURG ORTHO 8.5 STRL (GLOVE) ×2 IMPLANT
GOWN PREVENTION PLUS XLARGE (GOWN DISPOSABLE) ×2 IMPLANT
GOWN PREVENTION PLUS XXLARGE (GOWN DISPOSABLE) ×2 IMPLANT
GOWN STRL NON-REIN LRG LVL3 (GOWN DISPOSABLE) ×4 IMPLANT
HANDPIECE INTERPULSE COAX TIP (DISPOSABLE) ×1
INSERT TC3 SZ4 4X17.5MM (Knees) ×2 IMPLANT
KIT BASIN OR (CUSTOM PROCEDURE TRAY) ×2 IMPLANT
KIT ROOM TURNOVER OR (KITS) ×2 IMPLANT
MANIFOLD NEPTUNE II (INSTRUMENTS) ×2 IMPLANT
NEEDLE 22X1 1/2 (OR ONLY) (NEEDLE) IMPLANT
NS IRRIG 1000ML POUR BTL (IV SOLUTION) ×2 IMPLANT
PACK TOTAL JOINT (CUSTOM PROCEDURE TRAY) ×2 IMPLANT
PAD ARMBOARD 7.5X6 YLW CONV (MISCELLANEOUS) ×4 IMPLANT
PAD CAST 4YDX4 CTTN HI CHSV (CAST SUPPLIES) ×1 IMPLANT
PADDING CAST COTTON 4X4 STRL (CAST SUPPLIES) ×1
PADDING CAST COTTON 6X4 STRL (CAST SUPPLIES) ×2 IMPLANT
PATELLA DOME PFC 38MM (Knees) ×2 IMPLANT
POSTERIOR AUGMENT PFC SZ4 4MM (Knees) ×2 IMPLANT
POSTERIOR AUGMENT PFC SZ4 RT (Knees) ×4 IMPLANT
SET HNDPC FAN SPRY TIP SCT (DISPOSABLE) ×1 IMPLANT
SPONGE GAUZE 4X4 12PLY (GAUZE/BANDAGES/DRESSINGS) ×2 IMPLANT
STAPLER VISISTAT 35W (STAPLE) ×2 IMPLANT
STEM TIBIA PFC 13X30MM (Stem) ×2 IMPLANT
STEM UNIVERSAL REVISION 75X18 (Stem) ×2 IMPLANT
SUCTION FRAZIER TIP 10 FR DISP (SUCTIONS) ×2 IMPLANT
SUT BONE WAX W31G (SUTURE) ×2 IMPLANT
SUT ETHIBOND NAB CT1 #1 30IN (SUTURE) ×8 IMPLANT
SUT VIC AB 0 CT1 27 (SUTURE) ×1
SUT VIC AB 0 CT1 27XBRD ANBCTR (SUTURE) ×1 IMPLANT
SUT VIC AB 2-0 FS1 27 (SUTURE) ×4 IMPLANT
SYR CONTROL 10ML LL (SYRINGE) IMPLANT
TOWER CARTRIDGE SMART MIX (DISPOSABLE) ×2 IMPLANT
TRAY FOLEY CATH 16FRSI W/METER (SET/KITS/TRAYS/PACK) ×2 IMPLANT
TRAY REVISION SZ 3 (Knees) ×2 IMPLANT
TRAY SLEEVE CEM ML (Knees) ×2 IMPLANT
WATER STERILE IRR 1000ML POUR (IV SOLUTION) ×6 IMPLANT

## 2012-09-13 NOTE — H&P (Signed)
  PATIENT ID:      Roy [name]  MRN:     [id number] DOB/AGE:    06-13-1955 / 57 y.o.       OPERATIVE REPORT    DATE OF PROCEDURE:  09/13/2012       PREOPERATIVE DIAGNOSIS:   Infected Right Total Knee Arthroplasty, Status Post Irrigation and Debridement with Antibiotic Spacer.                                                       Estimated body mass index is 29.71 kg/(m^2) as calculated from the following:   Height as of 09/09/12: 5\' 6"  (1.676 m).   Weight as of 08/03/12: 83.462 kg (184 lb).     POSTOPERATIVE DIAGNOSIS:   Infected Right Total Knee Arthroplasty, Status Post Irrigation and Debridement with Antibiotic Spacer.                                                                     Estimated body mass index is 29.71 kg/(m^2) as calculated from the following:   Height as of 09/09/12: 5\' 6"  (1.676 m).   Weight as of 08/03/12: 83.462 kg (184 lb).     PROCEDURE:  Procedure(s): TOTAL KNEE REVISION right     SURGEON:  [name], MD    ASSISTANT:   [name], PA-C   (Present and scrubbed throughout the case, critical for assistance with exposure, retraction, instrumentation, and closure.)          ANESTHESIA: regional and general     DRAINS: (right knee) Hemovact drain(s) in the clamped with  Suction Clamped :      TOURNIQUET TIME:  Total Tourniquet Time Documented: Thigh (Right) - 101 minutes Thigh (Right) - 33 minutes Total: Thigh (Right) - 134 minutes     COMPLICATIONS:  None   CONDITION:  stable  PROCEDURE IN [id number]   [name] W 09/13/2012, 10:57 AM

## 2012-09-13 NOTE — Op Note (Signed)
Roy [name], Roy [name]  MEDICAL RECORD NO.:  [id number]  LOCATION:  5N16C                        FACILITY:  [facility]  PHYSICIAN:  Claude Manges. 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  PROCEDURE:  Removal of antibiotic spacer, and temporary implants with revision of total knee replacement.  SURGEON:  Claude Manges. Cleophas Dunker, M.D.  ASSISTANT:  Arlys John D. Petrarca, P.A.-C.  ANESTHESIA:  General with supplemental femoral nerve block.  COMPLICATIONS:  None.  COMPONENTS:  DePuy PFC tibial stem measuring 13 mm in diameter, 30 mm in length.  I used a tibial sleeve and #3 tibial tray, and a 17.5 mm polyethylene bridging bearing.  I used a #4 femoral component with a 75- mm long and 18 mm in diameter stem.  I used a 4 mm augments distally, both laterally and medially and 8 mm augment laterally and a 4 mm augment medially and posteriorly and 4 mm should be anterior and posterior.  A #38 polyethylene patellar button all were secured with polymethyl methacrylate using vancomycin.  DESCRIPTION OF PROCEDURE:  [name] was met in the holding area, identified the right knee as appropriate operative site.  He did receive a femoral nerve block for anesthesia.  He was then transported room #7 and placed under general anesthesia without difficulty.  Nursing staff inserted a Foley catheter.  Urine was clear.  Tourniquet was applied to the right thigh.  Leg was then prepped with chlorhexidine scrub and then DuraPrep, and the tourniquet to  the tips of the toes.  Sterile draping was performed.  With the extremity elevated, Esmarch exsanguinated with a proximal tourniquet at 350 mmHg.  The prior longitudinal incision was utilized and elliptically excised. There was abundant thickened scar representing subcutaneous tissue and first layer of capsule was incised in the midline with the Bovie. Medial parapatellar incision was made.  The deep capsular joint was entered.  There was minimal clear yellow joint effusion.  I did send the fluid off for culture and sensitivity.  With some difficulty, I was able to evert the patella of 180 degrees, but I released the thickened tissue along the medial and lateral tibia as well as the femur.  The Palacos cement with antibiotic was removed with the osteotome.  The polyethylene bridging bearing was removed with the same as well as the methacrylate from the proximal tibia.  Then, I removed any soft tissue around the femur and the tibia.  There was no evidence of osteomyelitis nor persistent infection.  I then reamed the tibia to a 13 mm stem and checked with image intensification to be sure that the reamer.  The hand reamer was within the tibial shaft.  I measured a #3 tibial tray, and did a clean-up cut transversely and then with a 2 degrees angle of declination on the tibia.  Because of the wide gap in the center portion of the tibia, we inserted metallic sleeve over the stem.  At that point, the trial of tibia appeared to be in excellent position.  Attention was then turned to the femur.  I removed all soft tissue, and then hand-reamed to 18 mm.  I used a 5 degree distal femoral valgus cut to the anterior cuts and using the #4 femoral jig which appeared to be the appropriate size, and then I used the second femoral jig to obtain the posterior cuts and the tapered cuts, I needed a 4 mm augments distally, medially, and laterally on the femur, 8 mm of an augment posterolaterally and 4  posterior medially.  The trial femoral component with the appropriate augments was then inserted and appeared to be an excellent fit.  At that point, I have reinserted the tibial trial with the 13 mm wide x 30 mm long stem and the central sleeve and impacted on the femur on proximal tibia, and then used the trial femoral component and inserted that into the femur.  I then initially trialed a 12.5 mm bridging bearing and eventually used a 17.5 that gave [place] full extension and no opening with varus or valgus stress.  The patella was prepared by the making as a single cut to flatten the patella removing any soft tissue from around its edges.  I trialed a #38 and made the appropriate holes in the tibia.  The trial patella was applied and it was quite tight laterally, so I performed a lateral release.  At that point, the patella was stable.  The trial components were then removed.  We released the tourniquet at 100 minutes.  Bleeders were Bovie coagulated.  We then copiously irrigated the joint with saline solution.  At that point, the final components were prepared on the side table.  The final #3 tibial tray with the 13 x 30 mm stem and central sleeve was assembled and then a #4 femoral component with the 18 x 75 stem and the augments as listed above was also assembled.  I inserted a #4 cement restrictor in the tibia, then inserted the tibial component with methacrylate with a gram of vancomycin in each batch.  This was impacted on the on the tibia and maintained in place until the methacrylate hardened.  We did let the tourniquet down for 16 minutes at 100 minute mark and then re-Esmarch and then reinflated the tourniquet to 350 mmHg.  Second tourniquet time was 33 minutes with 16 minutes in between the tourniquet times.  In similar fashion, the assembled femur was then impacted in place with the methacrylate and vancomycin.  When the methacrylate had matured, I then trialed several of  the tibial polyethylene bearings and felt that the 17.5 gave [place] full extension and no opening with varus or valgus stress.  The patient had considerable contractures both prior to infection and during the recuperative period from its initial I and D, and had only about 30 degrees of knee flexion.  Only had about 60 degrees on the opposite on operated knee.  So, we expect that he will not gain much flexion, but we felt the knee was perfectly stable.  The patella was then also impacted with polymethyl methacrylate and vancomycin paragraphs.  At 33 minutes, I marked the tourniquet was deflated.  The methacrylate had matured.  We had excellent stability. No opening with varus or valgus stress full extension.  I will be allowed about 30-35  degrees of flexion at that point.  The wound was again irrigated with saline solution.  We infiltrated the deep capsule with 0.25% Marcaine with epinephrine, sprayed thrombin spray around the joint and then closed the deep capsule with interrupted #1 Ethibond, superficial capsule closed with running 0 Vicryl, subcu with 3-0 Monocryl.  Skin was closed with skin clips.  We did insert a Hemovac laterally, it was clamped.  Sterile bulky dressing was applied followed by the support stocking.  The patient tolerated the procedure well without complications.     Claude Manges. Cleophas Dunker, M.D.     PWW/MEDQ  D:  09/13/2012  T:  09/13/2012  Job:  [id number]

## 2012-09-13 NOTE — Anesthesia Procedure Notes (Signed)
Anesthesia Regional Block:  Femoral nerve block  Pre-Anesthetic Checklist: ,, timeout performed, Correct Patient, Correct Site, Correct Laterality, Correct Procedure, Correct Position, site marked, Risks and benefits discussed,  Surgical consent,  Pre-op evaluation,  At surgeon's request and post-op pain management  Laterality: Right  Prep: chloraprep       Needles:   Needle Type: Echogenic Stimulator Needle     Needle Length:cm 9 cm Needle Gauge: 22 and 22 G    Additional Needles: Femoral nerve block Narrative:  Start time: 09/13/2012 7:10 AM End time: 09/13/2012 7:15 AM Injection made incrementally with aspirations every 5 mL.  Performed by: Personally   Additional Notes: Adductor canal block 20 cc 0.5% marcaine 1:200 Epi injected easily.  Kipp Brood, MD

## 2012-09-13 NOTE — H&P (Signed)
  The recent History & Physical has been reviewed. I have personally examined the patient today. There is no interval change to the documented History & Physical. The patient would like to proceed with the procedure.  [name] W 09/13/2012,  7:22 AM

## 2012-09-13 NOTE — Plan of Care (Signed)
Problem: Consults Goal: Diagnosis- Total Joint Replacement Revision Total Knee Right     

## 2012-09-13 NOTE — Preoperative (Signed)
Beta Blockers   Reason not to administer Beta Blockers:Not Applicable 

## 2012-09-13 NOTE — Transfer of Care (Signed)
Immediate Anesthesia Transfer of Care Note  Patient: Roy [name]  Procedure(s) Performed: Procedure(s) with comments: TOTAL KNEE REVISION (Right) - Revision Arthroplasty Right Knee  Patient Location: PACU  Anesthesia Type:General and GA combined with regional for post-op pain  Level of Consciousness: awake, alert  and oriented  Airway & Oxygen Therapy: Patient Spontanous Breathing and Patient connected to face mask oxygen  Post-op Assessment: Report given to PACU RN  Post vital signs: Reviewed and stable  Complications: No apparent anesthesia complications

## 2012-09-13 NOTE — Anesthesia Preprocedure Evaluation (Addendum)
Anesthesia Evaluation   Patient awake    Reviewed: Allergy & Precautions, H&P , NPO status , Patient's Chart, lab work & pertinent test results  Airway Mallampati: II TM Distance: >3 FB Neck ROM: Full    Dental  (+) Teeth Intact, Chipped and Dental Advisory Given,    Pulmonary former smoker,  breath sounds clear to auscultation  Pulmonary exam normal       Cardiovascular Rhythm:Regular Rate:Normal     Neuro/Psych    GI/Hepatic   Endo/Other    Renal/GU      Musculoskeletal  (+) Arthritis -,   Abdominal Normal abdominal exam  (+)   Peds  Hematology   Anesthesia Other Findings   Reproductive/Obstetrics                          Anesthesia Physical Anesthesia Plan  ASA: II  Anesthesia Plan: General   Post-op Pain Management:    Induction: Intravenous  Airway Management Planned: Oral ETT and Video Laryngoscope Planned  Additional Equipment:   Intra-op Plan:   Post-operative Plan: Extubation in OR  Informed Consent: I have reviewed the patients History and Physical, chart, labs and discussed the procedure including the risks, benefits and alternatives for the proposed anesthesia with the patient or authorized representative who has indicated his/her understanding and acceptance.   Dental advisory given  Plan Discussed with: CRNA, Anesthesiologist and Surgeon  Anesthesia Plan Comments:         Anesthesia Quick Evaluation

## 2012-09-13 NOTE — Progress Notes (Signed)
PT Cancellation Note  Patient Details Name: NETHANIEL MATTIE MRN: [id number] DOB: 11-11-1955   Cancelled Treatment:    Reason Eval/Treat Not Completed: pt and pt spouse decline due to nausea/vomitting. PT to return tomorrow 09/14/12.   Marcene Brawn 09/13/2012, 4:31 PM

## 2012-09-13 NOTE — Anesthesia Postprocedure Evaluation (Signed)
  Anesthesia Post-op Note  Patient: Roy [name]  Procedure(s) Performed: Procedure(s) with comments: TOTAL KNEE REVISION (Right) - Revision Arthroplasty Right Knee  Patient Location: PACU  Anesthesia Type:General and GA combined with regional for post-op pain  Level of Consciousness: awake, alert  and oriented  Airway and Oxygen Therapy: Patient Spontanous Breathing and Patient connected to nasal cannula oxygen  Post-op Pain: mild  Post-op Assessment: Post-op Vital signs reviewed, Patient's Cardiovascular Status Stable, Respiratory Function Stable, Patent Airway and Pain level controlled  Post-op Vital Signs: stable  Complications: No apparent anesthesia complications

## 2012-09-13 NOTE — Progress Notes (Signed)
Orthopedic Tech Progress Note Patient Details:  Roy [name] 03-07-1955 [id number]  CPM Right Knee CPM Right Knee: On Right Knee Flexion (Degrees): 30 Right Knee Extension (Degrees): 0 Additional Comments: trapeze bar   Cammer, Mickie Bail 09/13/2012, 12:47 PM

## 2012-09-14 ENCOUNTER — Encounter (HOSPITAL_COMMUNITY): Payer: Self-pay | Admitting: General Practice

## 2012-09-14 LAB — CBC
HCT: 31.3 % — ABNORMAL LOW (ref 39.0–52.0)
Hemoglobin: 10.4 g/dL — ABNORMAL LOW (ref 13.0–17.0)
MCH: 26.2 pg (ref 26.0–34.0)
MCHC: 33.2 g/dL (ref 30.0–36.0)
MCV: 78.8 fL (ref 78.0–100.0)
Platelets: 237 10*3/uL (ref 150–400)
RBC: 3.97 MIL/uL — ABNORMAL LOW (ref 4.22–5.81)
RDW: 13.3 % (ref 11.5–15.5)
WBC: 7.4 10*3/uL (ref 4.0–10.5)

## 2012-09-14 LAB — URINE CULTURE
Colony Count: NO GROWTH
Culture: NO GROWTH

## 2012-09-14 LAB — BASIC METABOLIC PANEL
BUN: 11 mg/dL (ref 6–23)
CO2: 26 mEq/L (ref 19–32)
Calcium: 8.6 mg/dL (ref 8.4–10.5)
Chloride: 100 mEq/L (ref 96–112)
Creatinine, Ser: 0.6 mg/dL (ref 0.50–1.35)
GFR calc Af Amer: >90 mL/min (ref >90)
GFR calc non Af Amer: >90 mL/min (ref >90)
Glucose, Bld: 113 mg/dL — ABNORMAL HIGH (ref 70–99)
Potassium: 3.8 mEq/L (ref 3.5–5.1)
Sodium: 136 mEq/L (ref 135–145)

## 2012-09-14 MED ORDER — CEFAZOLIN SODIUM 1-5 GM-% IV SOLN
1.0000 g | Freq: Four times a day (QID) | INTRAVENOUS | Status: AC
  Administered 2012-09-14 (×4): 1 g via INTRAVENOUS
  Filled 2012-09-14 (×4): qty 50

## 2012-09-14 NOTE — Progress Notes (Signed)
09/14/12 Spoke with patient about HHC. He chose [facility] from [place] agencies list.Contacted Marie at [facility] and set up HHPT, HHOT and HHRN.[employer] providing wheelchair, wheelchair cushion, 3N1 and rolling walker. Lives with spouse. Jaydyn Menon RN, BSN,CCM      

## 2012-09-14 NOTE — Evaluation (Signed)
Physical Therapy Evaluation Patient Details Name: Roy [name] MRN: [id number] DOB: 07-03-1955 Today's Date: 09/14/2012 Time: 4098-1191 PT Time Calculation (min): 40 min  PT Assessment / Plan / Recommendation History of Present Illness   57 yo s/p removal of antibiotic spacer, and temporary implants with  revision of RIGHT  total knee replacement   Clinical Impression  Pt presents with significant limitations to mobility mostly due to pain in operative limb.  Able to sit EOB and return supine and placed in CPM.  Expect with adequate pain control pt will be able to progress to d/c home as planned.  See below for further details.    PT Assessment  Patient needs continued PT services    Follow Up Recommendations  Home health PT;Supervision/Assistance - 24 hour    Does the patient have the potential to tolerate intense rehabilitation      Barriers to Discharge        Equipment Recommendations  Rolling walker with 5" wheels;None recommended by PT (confirm pt has ROLLING walker vs. standard)    Recommendations for Other Services     Frequency Min 5X/week    Precautions / Restrictions Precautions Precautions: Knee Precaution Comments: CPM 0-30 degrees increase by 5 daily to 90 degrees max, 6-8 hours/day; ice to knee Restrictions RLE Weight Bearing: Partial weight bearing RLE Partial Weight Bearing Percentage or Pounds: 50   Pertinent Vitals/Pain "12/10"  In knee -- premedicated and asking for additional meds following tx.      Mobility  Bed Mobility Bed Mobility: Supine to Sit;Sit to Supine Supine to Sit: 2: Max assist;HOB elevated;With rails Sit to Supine: 3: Mod assist;HOB flat Details for Bed Mobility Assistance: pt elects to exit bed on left; groans and exclaims in pain with physical assist to operative leg transitioning to EOB; sits EOB with bil UE support and bil legs extended; refuses attempt to stand today, defers until tomorrow.   Transfers Transfers: Not  assessed Ambulation/Gait Ambulation/Gait Assistance: Not tested (comment) Stairs: No    Exercises     PT Diagnosis: Acute pain;Difficulty walking  PT Problem List: Pain;Decreased knowledge of use of DME;Decreased mobility;Decreased activity tolerance;Decreased range of motion;Decreased strength PT Treatment Interventions: Modalities;Patient/family education;Therapeutic exercise;Therapeutic activities;Functional mobility training;Gait training;DME instruction     PT Goals(Current goals can be found in the care plan section) Acute Rehab PT Goals Patient Stated Goal: no more pain; feel better, return to teaching inf all PT Goal Formulation: With patient Time For Goal Achievement: 09/21/12 Potential to Achieve Goals: Good  Visit Information  Last PT Received On: 09/14/12 Assistance Needed: +1       Prior Functioning  Home Living Family/patient expects to be discharged to:: Private residence Living Arrangements: Spouse/significant other Available Help at Discharge: Family;Available 24 hours/day Type of Home: House Home Access: Ramped entrance Home Layout: One level Home Equipment: Bedside commode;Cane - single point;Walker - standard Prior Function Level of Independence: Independent with assistive device(s) Communication Communication: No difficulties    Cognition  Cognition Arousal/Alertness: Awake/alert Behavior During Therapy: WFL for tasks assessed/performed Overall Cognitive Status: Within Functional Limits for tasks assessed    Extremity/Trunk Assessment Upper Extremity Assessment Upper Extremity Assessment: Overall WFL for tasks assessed Lower Extremity Assessment Lower Extremity Assessment: RLE deficits/detail;LLE deficits/detail RLE Deficits / Details: old knee injury s/p screws and plates with significant restriction in flexion LLE Deficits / Details: operative limb with significant pain; tolerates up to 25 degrees flexion in CPM   Balance    End of Session PT  -  End of Session Activity Tolerance: Patient limited by pain Patient left: in CPM;in bed;with call bell/phone within reach;with family/visitor present Nurse Communication: Mobility status;Patient requests pain meds CPM Left Knee CPM Left Knee: On Left Knee Flexion (Degrees): 25 (pt reset from 30 degrees) Left Knee Extension (Degrees): 0 CPM Right Knee CPM Right Knee: Other (Comment) (NA this admission) Additional Comments: first time in CPM since surgery  GP     Dennis Bast 09/14/2012, 2:00 PM

## 2012-09-14 NOTE — Progress Notes (Signed)
OT Cancellation Note  Patient Details Name: Roy [name] MRN: [id number] DOB: 05-15-1955   Cancelled Treatment:    Reason Eval/Treat Not Completed: Pain limiting ability to participate  Pt refusing to get up with OT. Pt stating pain was 6/10. Will re-attempt tomorrow.  Earlie Raveling OTR/L [phone] 09/14/2012, 4:18 PM

## 2012-09-14 NOTE — Progress Notes (Signed)
Patient ID: Roy [name], male   DOB: 11-10-1955, 57 y.o.   MRN: [id number] PATIENT ID: Roy [name]        MRN:  [id number]          DOB/AGE: May 12, 1955 / 57 y.o.    Roy Campbell, MD   Roy Code, PA-C [address] [place], [place]  [address]                             [phone]   PROGRESS NOTE  Subjective:  negative for Chest Pain  negative for Shortness of Breath  positive for Nausea/Vomiting.  Better this morning.  Holding down fluids now  negative for Calf Pain    Tolerating Diet: yes  limited liquids at this time       Patient reports pain as 3 on 0-10 scale.     No major complaint this AM.  Pain controlled  Objective: Vital signs in last 24 hours:   Patient Vitals for the past 24 hrs:  BP Temp Pulse Resp SpO2  09/14/12 0553 125/72 mmHg 98.6 F (37 C) 75 16 99 %  09/14/12 0138 129/72 mmHg 97.8 F (36.6 C) 84 18 99 %  09/13/12 2058 138/74 mmHg 97.7 F (36.5 C) 94 18 99 %  09/13/12 1410 173/86 mmHg 97.7 F (36.5 C) 101 14 99 %  09/13/12 1356 - 98.2 F (36.8 C) - - -  09/13/12 1354 - - 88 23 98 %  09/13/12 1345 - - 69 24 98 %  09/13/12 1341 162/89 mmHg - 71 22 98 %  09/13/12 1330 - - 84 22 97 %  09/13/12 1326 151/75 mmHg - 68 22 96 %  09/13/12 1315 - - 65 22 97 %  09/13/12 1311 153/79 mmHg - 65 22 96 %  09/13/12 1300 - - 74 19 97 %  09/13/12 1256 154/81 mmHg - 74 21 97 %  09/13/12 1253 156/83 mmHg - 82 21 96 %  09/13/12 1245 154/81 mmHg - 76 22 97 %  09/13/12 1241 154/85 mmHg - 79 19 96 %  09/13/12 1230 154/85 mmHg - 61 24 97 %  09/13/12 1226 162/86 mmHg - 86 22 96 %  09/13/12 1215 162/82 mmHg - 83 21 98 %  09/13/12 1211 151/76 mmHg - 66 22 96 %  09/13/12 1200 - - 73 23 100 %  09/13/12 1156 168/90 mmHg - 87 20 99 %  09/13/12 1145 - 97.8 F (36.6 C) 83 23 95 %      Intake/Output from previous day:   07/22 0701 - 07/23 0700 In: 3490 [P.O.:240; I.V.:3250] Out: 2800 [Urine:2400; Drains:200]   Intake/Output this shift:       Intake/Output     07/22 0701 - 07/23 0700 07/23 0701 - 07/24 0700   P.O. 240    I.V. 3250    Total Intake 3490     Urine 2400    Drains 200    Blood 200    Total Output 2800     Net +690             LABORATORY DATA:  Recent Labs  09/09/12 1340 09/14/12 0405  WBC 8.0 7.4  HGB 15.2 10.4*  HCT 44.7 31.3*  PLT 300 237    Recent Labs  09/09/12 1340 09/14/12 0405  NA 137 136  K 4.0 3.8  CL 100 100  CO2 27 26  BUN 16 11  CREATININE 0.69 0.60  GLUCOSE 90 113*  CALCIUM 9.8 8.6   Lab Results  Component Value Date   INR 0.91 09/09/2012   INR 1.78* 07/07/2012   INR 1.64* 07/06/2012    Recent Radiographic Studies :  Chest 2 View  09/09/2012   *RADIOLOGY REPORT*  Clinical Data:  Preoperative respiratory exam for knee replacement surgery.  CHEST - 2 VIEW  Comparison: 07/04/2012  Findings: The heart size and mediastinal contours are within normal limits.  Both lungs are clear.  The visualized skeletal structures are unremarkable.  IMPRESSION: No active disease.   Original Report Authenticated By: Irish Lack, M.D.   Dg C-arm 1-60 Min-no Report  09/13/2012   CLINICAL DATA: Infected Right Total Knee Arthroplasty, Status Post  Irrigation and Debridement with Antibiotic Spacer.   C-ARM 1-60 MINUTES  Fluoroscopy was utilized by the requesting physician.  No radiographic  interpretation.      Examination:  General appearance: alert, cooperative, mild distress and moderate distress Resp: clear to auscultation bilaterally Cardio: regular rate and rhythm GI: normal findings: bowel sounds normal  Wound Exam: clean, dry, intact dressing  Drainage:  None on dressing  Motor Exam: EHL, FHL, Anterior Tibial and Posterior Tibial Intact  Sensory Exam: Superficial Peroneal, Deep Peroneal and Tibial normal  Vascular Exam: Right dorsalis pedis artery has 1+ (weak) pulse  Assessment:    1 Day Post-Op  Procedure(s) (LRB): TOTAL KNEE REVISION (Right)  ADDITIONAL DIAGNOSIS:    Active Problems:   * No active hospital problems. *  Acute Blood Loss Anemia asymptomatic at this time Resolving nausea   Plan: Physical Therapy as ordered Partial Weight Bearing @ 50% (PWB)  DVT Prophylaxis:  Xarelto, Foot Pumps and TED hose  DISCHARGE PLAN: Home  DISCHARGE NEEDS: HHPT, CPM, Walker and 3-in-1 comode seat  Hemovac pulled   50 cc last shift Continue 24 more hours of antibx since this is a revision of an antibiotic spacer for infection      [facility] 09/14/2012 8:10 AM

## 2012-09-15 LAB — BASIC METABOLIC PANEL
BUN: 5 mg/dL — ABNORMAL LOW (ref 6–23)
CO2: 28 mEq/L (ref 19–32)
Calcium: 8.8 mg/dL (ref 8.4–10.5)
Chloride: 97 mEq/L (ref 96–112)
Creatinine, Ser: 0.5 mg/dL (ref 0.50–1.35)
GFR calc Af Amer: >90 mL/min (ref >90)
GFR calc non Af Amer: >90 mL/min (ref >90)
Glucose, Bld: 94 mg/dL (ref 70–99)
Potassium: 3.8 mEq/L (ref 3.5–5.1)
Sodium: 133 mEq/L — ABNORMAL LOW (ref 135–145)

## 2012-09-15 LAB — CBC
HCT: 30.9 % — ABNORMAL LOW (ref 39.0–52.0)
Hemoglobin: 10.5 g/dL — ABNORMAL LOW (ref 13.0–17.0)
MCH: 26.7 pg (ref 26.0–34.0)
MCHC: 34 g/dL (ref 30.0–36.0)
MCV: 78.6 fL (ref 78.0–100.0)
Platelets: 235 10*3/uL (ref 150–400)
RBC: 3.93 MIL/uL — ABNORMAL LOW (ref 4.22–5.81)
RDW: 13.2 % (ref 11.5–15.5)
WBC: 8.5 10*3/uL (ref 4.0–10.5)

## 2012-09-15 NOTE — Evaluation (Signed)
Occupational Therapy Evaluation Patient Details Name: Roy [name] MRN: [id number] DOB: 1955-02-26 Today's Date: 09/15/2012 Time: 7829-5621 OT Time Calculation (min): 23 min  OT Assessment / Plan / Recommendation History of present illness s/p R TKA, removal of antibiotic spacer   Clinical Impression   Pt presents with limitations in ADL and mobility due to pain in R knee and longstanding decreased ROM in R knee.  Pt requires min assist, but ambulated only a few feet before becoming nauseous and needing to sit down.  Pt was dependent in LB ADL prior to admission and is not interested in AE.  Will focus on toileting, standing for grooming, and bed mobility to decrease burden of care at home.   OT Assessment  Patient needs continued OT Services    Follow Up Recommendations  Supervision/Assistance - 24 hour    Barriers to Discharge      Equipment Recommendations  None recommended by OT    Recommendations for Other Services    Frequency  Min 2X/week    Precautions / Restrictions Precautions Precautions: Knee;Fall Precaution Comments: CPM 0-30 degrees increase by 5 daily to 90 degrees max, 6-8 hours/day; ice to knee Restrictions Weight Bearing Restrictions: Yes RLE Weight Bearing: Partial weight bearing RLE Partial Weight Bearing Percentage or Pounds: 50   Pertinent Vitals/Pain L knee, premedicated, at least 10/10, repositioned, offered ice    ADL  Eating/Feeding: Independent Where Assessed - Eating/Feeding: Bed level Grooming: Wash/dry face;Set up Where Assessed - Grooming: Unsupported sitting Upper Body Bathing: Set up Where Assessed - Upper Body Bathing: Unsupported sitting Lower Body Bathing: Maximal assistance Where Assessed - Lower Body Bathing: Unsupported sitting;Supported sit to stand Upper Body Dressing: Set up Where Assessed - Upper Body Dressing: Unsupported sitting Lower Body Dressing: Maximal assistance Where Assessed - Lower Body Dressing: Unsupported  sitting;Supported sit to stand Toilet Transfer: Minimal assistance Equipment Used: Gait belt;Rolling walker Transfers/Ambulation Related to ADLs: limited ambulation due to pain and nausea ADL Comments: Pt with dependence in LB ADL at baseline.  Heavily reliant on lift chair prior to admission.    OT Diagnosis: Generalized weakness;Acute pain  OT Problem List: Decreased strength;Impaired balance (sitting and/or standing);Decreased activity tolerance;Pain OT Treatment Interventions: Self-care/ADL training;Patient/family education   OT Goals(Current goals can be found in the care plan section) Acute Rehab OT Goals Patient Stated Goal: return home with assist of 57 y. o. father, wife OT Goal Formulation: With patient Time For Goal Achievement: 09/22/12 Potential to Achieve Goals: Good ADL Goals Pt Will Perform Grooming: with supervision;standing Pt Will Transfer to Toilet: with supervision;ambulating Pt Will Perform Toileting - Clothing Manipulation and hygiene: with supervision;sit to/from stand Additional ADL Goal #1: Pt will perform bed mobility with supervision in prep for ADL.  Visit Information  Last OT Received On: 09/15/12 Assistance Needed: +1 PT/OT Co-Evaluation/Treatment: Yes History of Present Illness: s/p R TKA, removal of antibiotic spacer       Prior Functioning     Home Living Family/patient expects to be discharged to:: Private residence Living Arrangements: Spouse/significant other Available Help at Discharge: Family;Available 24 hours/day Type of Home: House Home Access: Ramped entrance Home Layout: One level Home Equipment: Bedside commode;Cane - single point;Walker - standard (lift chair) Prior Function Level of Independence: Needs assistance ADL's / Homemaking Assistance Needed: assisted for LB ADL prior to admission Communication Communication: No difficulties Dominant Hand: Right         Vision/Perception     Cognition   Cognition Arousal/Alertness: Awake/alert Behavior During Therapy: [facility]  for tasks assessed/performed Overall Cognitive Status: Within Functional Limits for tasks assessed    Extremity/Trunk Assessment Upper Extremity Assessment Upper Extremity Assessment: Overall WFL for tasks assessed Lower Extremity Assessment RLE Deficits / Details: old knee injury s/p screws and plates with significant restriction in flexion     Mobility Bed Mobility Bed Mobility: Supine to Sit;Sit to Supine Supine to Sit: 4: Min assist Sit to Supine: 4: Min assist Details for Bed Mobility Assistance: A for LE. Increased time needed Transfers Transfers: Sit to Stand;Stand to Sit Sit to Stand: 4: Min assist;From elevated surface;With upper extremity assist;From bed Stand to Sit: 4: Min guard;With upper extremity assist;To bed Details for Transfer Assistance: Patient stood and sat from elevated bed. Patient very verbal on wanted bed highly elevated. very decreased flexion in knees. Cues for safety     Exercise     Balance     End of Session OT - End of Session Activity Tolerance: Patient limited by pain Patient left: in bed;with call bell/phone within reach CPM Right Knee CPM Right Knee: Off  GO     Evern Bio 09/15/2012, 1:10 PM [phone]

## 2012-09-15 NOTE — Progress Notes (Signed)
Physical Therapy Treatment Patient Details Name: Roy [name] MRN: [id number] DOB: Jun 24, 1955 Today's Date: 09/15/2012 Time:  -     PT Assessment / Plan / Recommendation  History of Present Illness     Clinical Impression    PT Comments   Patient somewhat self limiting due to fear and pain. Has all equipment needed for DC home. Plans DC home tomorrow with assistance from father  Follow Up Recommendations  Home health PT;Supervision/Assistance - 24 hour     Does the patient have the potential to tolerate intense rehabilitation     Barriers to Discharge        Equipment Recommendations  Rolling walker with 5" wheels;None recommended by PT    Recommendations for Other Services    Frequency Min 5X/week   Progress towards PT Goals Progress towards PT goals: Progressing toward goals  Plan Current plan remains appropriate    Precautions / Restrictions Precautions Precaution Comments: CPM 0-30 degrees increase by 5 daily to 90 degrees max, 6-8 hours/day; ice to knee Restrictions Weight Bearing Restrictions: Yes RLE Weight Bearing: Partial weight bearing RLE Partial Weight Bearing Percentage or Pounds: 50   Pertinent Vitals/Pain 5/10 R knee RN provided medication to assist with pain control     Mobility  Bed Mobility Supine to Sit: 4: Min assist Sit to Supine: 4: Min assist Details for Bed Mobility Assistance: A for LE. Increased time needed Transfers Transfers: Sit to Stand;Stand to Sit Sit to Stand: 4: Min assist;From elevated surface;With upper extremity assist;From bed Stand to Sit: 4: Min guard;With upper extremity assist;To bed Details for Transfer Assistance: Patient stood and sat from elevated bed. Patient very verbal on wanted bed highly elevated. very decreased flexion in knees. Cues for safety Ambulation/Gait Ambulation/Gait Assistance: 4: Min guard Ambulation Distance (Feet): 5 Feet Assistive device: Rolling walker Ambulation/Gait Assistance Details:  Patient took a couple of steps forward and a couple of steps backwards but limited due to pain and fear of falling Gait Pattern: Step-to pattern;Antalgic    Exercises     PT Diagnosis:    PT Problem List:   PT Treatment Interventions:     PT Goals (current goals can now be found in the care plan section)    Visit Information  Last PT Received On: 09/15/12 Assistance Needed: +1    Subjective Data      Cognition  Cognition Arousal/Alertness: Awake/alert Behavior During Therapy: WFL for tasks assessed/performed Overall Cognitive Status: Within Functional Limits for tasks assessed    Balance     End of Session PT - End of Session Equipment Utilized During Treatment: Gait belt Activity Tolerance: Patient limited by pain Patient left: in bed;with call bell/phone within reach Nurse Communication: Mobility status;Patient requests pain meds CPM Right Knee CPM Right Knee: Off   GP     Fredrich Birks 09/15/2012, 12:04 PM 09/15/2012 Fredrich Birks PTA [phone] pager [phone] office

## 2012-09-15 NOTE — Progress Notes (Signed)
Patient ID: Roy [name], male   DOB: [birth year], 57 y.o.   MRN: [id number] PATIENT ID: Roy [name]        MRN:  [id number]          DOB/AGE: 1955/03/26 / 57 y.o.    Roy Campbell, MD   [name], PA-C [address] [place], [place]  [address]                             [phone]   PROGRESS NOTE  Subjective:  negative for Chest Pain  negative for Shortness of Breath  negative for Nausea/Vomiting   negative for Calf Pain    Tolerating Diet: yes         Patient reports pain as moderate.     Not much progress in PT  Objective: Vital signs in last 24 hours:   Patient Vitals for the past 24 hrs:  BP Temp Temp src Pulse Resp SpO2  09/15/12 0613 126/78 mmHg 98.6 F (37 C) Oral 90 16 97 %  09/15/12 0400 - - - - 17 -  09/15/12 0000 - - - - 17 -  09/14/12 2149 128/65 mmHg 98.8 F (37.1 C) Oral 100 16 94 %  09/14/12 2000 - - - - 17 -  09/14/12 1345 128/68 mmHg 97.9 F (36.6 C) - 85 16 100 %      Intake/Output from previous day:   07/23 0701 - 07/24 0700 In: 1480 [P.O.:1240; I.V.:240] Out: 3000 [Urine:3000]   Intake/Output this shift:   07/24 0701 - 07/24 1900 In: -  Out: 650 [Urine:650]   Intake/Output     07/23 0701 - 07/24 0700 07/24 0701 - 07/25 0700   P.O. 1240    I.V. 240    Total Intake 1480     Urine 3000 650   Drains     Blood     Total Output 3000 650   Net -1520 -650           LABORATORY DATA:  Recent Labs  09/09/12 1340 09/14/12 0405 09/15/12 0553  WBC 8.0 7.4 8.5  HGB 15.2 10.4* 10.5*  HCT 44.7 31.3* 30.9*  PLT 300 237 235    Recent Labs  09/09/12 1340 09/14/12 0405 09/15/12 0553  NA 137 136 133*  K 4.0 3.8 3.8  CL 100 100 97  CO2 27 26 28   BUN 16 11 5*  CREATININE 0.69 0.60 0.50  GLUCOSE 90 113* 94  CALCIUM 9.8 8.6 8.8   Lab Results  Component Value Date   INR 0.91 09/09/2012   INR 1.78* 07/07/2012   INR 1.64* 07/06/2012    Recent Radiographic Studies :  Chest 2 View  09/09/2012   *RADIOLOGY REPORT*   Clinical Data:  Preoperative respiratory exam for knee replacement surgery.  CHEST - 2 VIEW  Comparison: 07/04/2012  Findings: The heart size and mediastinal contours are within normal limits.  Both lungs are clear.  The visualized skeletal structures are unremarkable.  IMPRESSION: No active disease.   Original Report Authenticated By: Irish Lack, M.D.   Dg C-arm 1-60 Min-no Report  09/13/2012   CLINICAL DATA: Infected Right Total Knee Arthroplasty, Status Post  Irrigation and Debridement with Antibiotic Spacer.   C-ARM 1-60 MINUTES  Fluoroscopy was utilized by the requesting physician.  No radiographic  interpretation.      Examination:  General appearance: alert, cooperative and no distress  Wound Exam: clean, dry, intact  Drainage:  None: wound tissue dry  Motor Exam: EHL, FHL, Anterior Tibial and Posterior Tibial Intact  Sensory Exam: Superficial Peroneal, Deep Peroneal and Tibial normal  Vascular Exam: Normal  Assessment:    2 Days Post-Op  Procedure(s) (LRB): TOTAL KNEE REVISION (Right)  ADDITIONAL DIAGNOSIS:  Active Problems:   * No active hospital problems. *  no new problems   Plan: Physical Therapy as ordered Partial Weight Bearing @ 50% (PWB)  DVT Prophylaxis:  Xarelto  DISCHARGE PLAN: Home  DISCHARGE NEEDS: HHPT    Pain under control with dilaudid,needs more PT-dressing changed, wound intact- will plan D/C in am     Roy [name] W 09/15/2012 9:58 AM

## 2012-09-16 LAB — BASIC METABOLIC PANEL
BUN: 9 mg/dL (ref 6–23)
CO2: 29 mEq/L (ref 19–32)
Calcium: 8.9 mg/dL (ref 8.4–10.5)
Chloride: 96 mEq/L (ref 96–112)
Creatinine, Ser: 0.49 mg/dL — ABNORMAL LOW (ref 0.50–1.35)
GFR calc Af Amer: >90 mL/min (ref >90)
GFR calc non Af Amer: >90 mL/min (ref >90)
Glucose, Bld: 104 mg/dL — ABNORMAL HIGH (ref 70–99)
Potassium: 3.9 mEq/L (ref 3.5–5.1)
Sodium: 135 mEq/L (ref 135–145)

## 2012-09-16 LAB — CBC
HCT: 30.4 % — ABNORMAL LOW (ref 39.0–52.0)
Hemoglobin: 10.2 g/dL — ABNORMAL LOW (ref 13.0–17.0)
MCH: 26.6 pg (ref 26.0–34.0)
MCHC: 33.6 g/dL (ref 30.0–36.0)
MCV: 79.4 fL (ref 78.0–100.0)
Platelets: 243 10*3/uL (ref 150–400)
RBC: 3.83 MIL/uL — ABNORMAL LOW (ref 4.22–5.81)
RDW: 13.5 % (ref 11.5–15.5)
WBC: 8.6 10*3/uL (ref 4.0–10.5)

## 2012-09-16 LAB — BODY FLUID CULTURE
Culture: NO GROWTH
Gram Stain: NONE SEEN

## 2012-09-16 MED ORDER — HYDROMORPHONE HCL 2 MG PO TABS: 2.0000 mg | ORAL_TABLET | ORAL | Status: DC | PRN

## 2012-09-16 MED ORDER — METHOCARBAMOL 500 MG PO TABS: 500.0000 mg | ORAL_TABLET | Freq: Three times a day (TID) | ORAL | Status: DC | PRN

## 2012-09-16 MED ORDER — RIVAROXABAN 10 MG PO TABS: 10.0000 mg | ORAL_TABLET | ORAL | Status: DC

## 2012-09-16 NOTE — Progress Notes (Signed)
Patient refusing to do required amount of CPM time for yesterday.  Patient stating that he will do CPM time when he goes home.  Patient agreeable to do CPM time this am, placed in machine at 0630 am.  Nursing will continue to monitor.

## 2012-09-16 NOTE — Progress Notes (Signed)
Physical Therapy Treatment Patient Details Name: Roy [name] MRN: [id number] DOB: 11/03/1955 Today's Date: 09/16/2012 Time: 9147-8295 PT Time Calculation (min): 23 min  PT Assessment / Plan / Recommendation  History of Present Illness s/p R TKA, removal of antibiotic spacer   Clinical Impression Pt. Is making gradual/slow  progress with mobility.  Has decreased independence in sit<>stand transitions due to decrease in bilateral knee ROM      Follow Up Recommendations  Home health PT;Supervision/Assistance - 24 hour     Does the patient have the potential to tolerate intense rehabilitation     Barriers to Discharge        Equipment Recommendations  Rolling walker with 5" wheels;Other (comment) (pt. has SW ; RW would decrease effort of mobility)    Recommendations for Other Services    Frequency Min 5X/week   Progress towards PT Goals Progress towards PT goals: Progressing toward goals  Plan Current plan remains appropriate    Precautions / Restrictions Precautions Precautions: Fall;Knee Restrictions Weight Bearing Restrictions: Yes RLE Weight Bearing: Partial weight bearing RLE Partial Weight Bearing Percentage or Pounds: 50   Pertinent Vitals/Pain See vitals tab     Mobility  Bed Mobility Bed Mobility: Sit to Supine Supine to Sit: 4: Min assist;With rails Sit to Supine: 4: Min assist;HOB flat Details for Bed Mobility Assistance: min assist for managing his right leg up into bed; p.t able to manage upper body Transfers Transfers: Sit to Stand;Stand to Sit Sit to Stand: 4: Min assist;With upper extremity assist;From chair/3-in-1;With armrests Stand to Sit: 4: Min assist;With upper extremity assist;To bed Details for Transfer Assistance: pt. with decreased ROM in both knees (L knee from old injury by his report).  He uses UEs to lower self while keeping both knees esentially straight, making his sit<>stand transitions more  difficult Ambulation/Gait Ambulation/Gait Assistance: 4: Min guard Ambulation Distance (Feet): 20 Feet Assistive device: Rolling walker Ambulation/Gait Assistance Details: min guard assist for safety and needs increased time.  Fatigues quickly. Gait Pattern: Step-to pattern;Antalgic Gait velocity: decreased Stairs: No    Exercises Total Joint Exercises Ankle Circles/Pumps: AROM;Both;10 reps Quad Sets: AROM;Right;10 reps Short Arc QuadBarbaraann Boys;Right;10 reps Heel Slides: AAROM;Right;10 reps Hip ABduction/ADduction: AAROM;Right;10 reps Goniometric ROM: 0-40 active assistive   PT Diagnosis:    PT Problem List:   PT Treatment Interventions:     PT Goals (current goals can now be found in the care plan section) Acute Rehab PT Goals Patient Stated Goal: return home with assist of 57 y. o. father, wife PT Goal Formulation: With patient Time For Goal Achievement: 09/21/12 Potential to Achieve Goals: Good  Visit Information  Last PT Received On: 09/16/12 Assistance Needed: +1 History of Present Illness: s/p R TKA, removal of antibiotic spacer    Subjective Data  Subjective: Requesting to go back to bed after PT session Patient Stated Goal: return home with assist of 57 y. o. father, wife   Cognition  Cognition Arousal/Alertness: Awake/alert Behavior During Therapy: WFL for tasks assessed/performed Overall Cognitive Status: Within Functional Limits for tasks assessed    Balance     End of Session PT - End of Session Equipment Utilized During Treatment: Gait belt Activity Tolerance: Patient limited by pain;Patient limited by fatigue Patient left: in bed;with call bell/phone within reach Nurse Communication: Mobility status;Patient requests pain meds CPM Right Knee CPM Right Knee: On   GP     Ferman Hamming 09/16/2012, 9:28 AM Weldon Picking PT Acute Rehab Services [phone] [place] [phone]

## 2012-09-16 NOTE — Discharge Summary (Signed)
Roy Campbell, MD   [name], PA-C [address], [place], [place]  [address]                             [phone]  PATIENT ID: Roy [name]        MRN:  [id number]          DOB/AGE: 59-20-1957 / 59 y.o.    DISCHARGE SUMMARY  ADMISSION DATE:    09/13/2012 DISCHARGE DATE:   09/16/2012   ADMISSION DIAGNOSIS: Infected Right Total Knee Arthroplasty, Status Post Irrigation and Debridement with Antibiotic Spacer.    DISCHARGE DIAGNOSIS:  Infected Right Total Knee Arthroplasty, Status Post Irrigation and Debridement with Antibiotic Spacer.    ADDITIONAL DIAGNOSIS: Active Problems:   * No active hospital problems. *  Past Medical History  Diagnosis Date  . Drug addict     Clean for 20 years  . Psoriatic arthritis   . Seasonal allergies   . Arthritis     PROCEDURE: Procedure(s): TOTAL KNEE REVISION Righton 09/13/2012  CONSULTS: none     HISTORY: Roy [name] is approximately 6 weeks status post I&D of an infected right total knee replacement and insertion of an antibiotic spacer. He has been followed by the ID Department, specifically Dr. Ninetta Lights for followup antibiotic treatment. He did grow MSSE, he was in IV cephalexin. He recently saw Dr. Ninetta Lights last week who has approved "the next step" according to [name], so he is actually ready for the revision arthroplasty. He has done very well in the meantime, does not have any fevers or chills. Has been cleared for reimplantation bu Dr Ninetta Lights.   HOSPITAL COURSE:  Roy [name] is a 57 y.o. admitted on 09/13/2012 and found to have a diagnosis of Infected Right Total Knee Arthroplasty, Status Post Irrigation and Debridement with Antibiotic Spacer..  After appropriate laboratory studies were obtained  they were taken to the operating room on 09/13/2012 and underwent  Procedure(s): TOTAL KNEE REVISION  Right WITH ANTIBIOTIC SPACER REMOVAL   They were given perioperative antibiotics:  Anti-infectives   Start     Dose/Rate  Route Frequency Ordered Stop   09/14/12 0815  ceFAZolin (ANCEF) IVPB 1 g/50 mL premix    Comments:  S/p Revision TKR after removal of antibiotic spacer.  S/p infected TKR.  Giving an additional day of IV antibiotics for that reason   1 g 100 mL/hr over 30 Minutes Intravenous 4 times per day 09/14/12 0809 09/15/12 0020   09/13/12 1500  ceFAZolin (ANCEF) IVPB 2 g/50 mL premix     2 g 100 mL/hr over 30 Minutes Intravenous Every 6 hours 09/13/12 1408 09/13/12 2216   09/13/12 0912  vancomycin (VANCOCIN) powder  Status:  Discontinued       As needed 09/13/12 0913 09/13/12 1137   09/13/12 0600  ceFAZolin (ANCEF) 3 g in dextrose 5 % 50 mL IVPB     3 g 160 mL/hr over 30 Minutes Intravenous On call to O.R. 09/12/12 1410 09/13/12 0746    .  Tolerated the procedure well.  Placed with a foley intraoperatively.    Roy [name] given preop and 24 hours postop. Toradol was given post op.  POD #1, allowed out of bed to a chair.  PT for ambulation and exercise program.  Foley D/C'd in morning.  IV saline locked.  O2 discontionued. Hemovac pulled.  POD #2, continued PT and ambulation.  Refused discharge and wanted more inpatient PT  POD #3, continued PT and ambulation.  Doing better and ready for D/C to home .  The remainder of the hospital course was dedicated to ambulation and strengthening.   The patient was discharged on 3 Days Post-Op in  Stable condition.  Blood products given:none  DIAGNOSTIC STUDIES: Recent vital signs: Patient Vitals for the past 24 hrs:  BP Temp Temp src Pulse Resp SpO2  09/16/12 0609 135/71 mmHg 98.5 F (36.9 C) Oral 100 18 97 %  09/15/12 2122 123/67 mmHg 98.9 F (37.2 C) Oral 100 18 95 %  09/15/12 1400 125/70 mmHg 98.1 F (36.7 C) - 111 18 96 %       Recent laboratory studies:  Recent Labs  09/09/12 1340 09/14/12 0405 09/15/12 0553 09/16/12 0540  WBC 8.0 7.4 8.5 8.6  HGB 15.2 10.4* 10.5* 10.2*  HCT 44.7 31.3* 30.9* 30.4*  PLT 300 237 235 243    Recent Labs   09/09/12 1340 09/14/12 0405 09/15/12 0553 09/16/12 0540  NA 137 136 133* 135  K 4.0 3.8 3.8 3.9  CL 100 100 97 96  CO2 27 26 28 29   BUN 16 11 5* 9  CREATININE 0.69 0.60 0.50 0.49*  GLUCOSE 90 113* 94 104*  CALCIUM 9.8 8.6 8.8 8.9   Lab Results  Component Value Date   INR 0.91 09/09/2012   INR 1.78* 07/07/2012   INR 1.64* 07/06/2012     Recent Radiographic Studies :  Chest 2 View  09/09/2012   *RADIOLOGY REPORT*  Clinical Data:  Preoperative respiratory exam for knee replacement surgery.  CHEST - 2 VIEW  Comparison: 07/04/2012  Findings: The heart size and mediastinal contours are within normal limits.  Both lungs are clear.  The visualized skeletal structures are unremarkable.  IMPRESSION: No active disease.   Original Report Authenticated By: Irish Lack, M.D.   Dg C-arm 1-60 Min-no Report  09/13/2012   CLINICAL DATA: Infected Right Total Knee Arthroplasty, Status Post  Irrigation and Debridement with Antibiotic Spacer.   C-ARM 1-60 MINUTES  Fluoroscopy was utilized by the requesting physician.  No radiographic  interpretation.     DISCHARGE INSTRUCTIONS: Discharge Orders   Future Orders Complete By Expires     CPM  As directed     Comments:      Continuous passive motion machine (CPM):      Use the CPM from 0 to 30 for 6-8 hours per day.      You may increase by 5 per day.  You may break it up into 2 or 3 sessions per day.      Use CPM for 3-4 weeks or until you are told to stop.    Call MD / Call 911  As directed     Comments:      If you experience chest pain or shortness of breath, CALL 911 and be transported to the hospital emergency room.  If you develope a fever above 101 F, pus (white drainage) or increased drainage or redness at the wound, or calf pain, call your surgeon's office.    Change dressing  As directed     Comments:      Change dressing on SUNDAY, then change the dressing daily with sterile 4 x 4 inch gauze dressing and apply TED hose.  You may clean the  incision with alcohol prior to redressing.    Constipation Prevention  As directed     Comments:      Drink plenty of fluids.  Prune  juice and/or coffee may be helpful.  You may use a stool softener, such as Colace (over the counter) 100 mg twice a day.  Use MiraLax (over the counter) for constipation as needed but this may take several days to work.  Mag Citrate --OR-- Milk of Magnesia may also be used but follow directions on the label.    Diet - low sodium heart healthy  As directed     Diet general  As directed     Do not put a pillow under the knee. Place it under the heel.  As directed     Driving restrictions  As directed     Comments:      No driving for 6 weeks    Increase activity slowly as tolerated  As directed     Lifting restrictions  As directed     Comments:      No lifting for 6 weeks    Partial weight bearing  As directed     Comments:      50 % WEIGHT BEARING AS TAUGHT IN PHYSICAL THERAPY    Patient may shower  As directed     Comments:      You may shower over the brown dressing.  Once the dressing is removed you may shower without a dressing once there is no drainage.  Do not wash over the wound.  If drainage remains, cover wound with plastic wrap and then shower.    TED hose  As directed     Comments:      Use stockings (TED hose) for 2 weeks on operative leg(s).  You may remove them at night for sleeping.       DISCHARGE MEDICATIONS:     Medication List    STOP taking these medications       ibuprofen 200 MG tablet  Commonly known as:  ADVIL,MOTRIN      TAKE these medications       cephALEXin 500 MG capsule  Commonly known as:  KEFLEX  Take 500 mg by mouth 2 (two) times daily. Continuous course started 09/03/12 - until stopped by doctor -     cholecalciferol 400 UNITS Tabs  Commonly known as:  VITAMIN D  Take 400 Units by mouth daily.     eucerin cream  Apply 1 application topically 3 (three) times daily.     HYDROmorphone 2 MG tablet  Commonly  known as:  DILAUDID  Take 1 tablet (2 mg total) by mouth every 4 (four) hours as needed for pain.     methocarbamol 500 MG tablet  Commonly known as:  ROBAXIN  Take 1 tablet (500 mg total) by mouth every 8 (eight) hours as needed (spasms).     multivitamin with minerals Tabs  Take 1 tablet by mouth daily.     rivaroxaban 10 MG Tabs tablet  Commonly known as:  XARELTO  Take 1 tablet (10 mg total) by mouth daily.        FOLLOW UP VISIT:       Follow-up Information   Follow up with Valeria Batman, MD On 09/26/2012.   Contact information:   [address] [place] [address] [phone]       DISPOSITION:   Home  CONDITION:  Stable   Patrisia Faeth 09/16/2012, 8:30 AM

## 2012-09-16 NOTE — Progress Notes (Signed)
Patient ID: RANSON BELLUOMINI, male   DOB: 08/01/1955, 57 y.o.   MRN: [id number] PATIENT ID: BOOMER WINDERS        MRN:  [id number]          DOB/AGE: [birth year] / 57 y.o.    [name], MD   [name], PA-C [address] [place], [place]  [address]                             [phone]   PROGRESS NOTE  Subjective:  negative for Chest Pain  negative for Shortness of Breath  negative for Nausea/Vomiting   negative for Calf Pain    Tolerating Diet: yes         Patient reports pain as mild and moderate.     Doing better.  Tolerated CPM  Objective: Vital signs in last 24 hours:   Patient Vitals for the past 24 hrs:  BP Temp Temp src Pulse Resp SpO2  09/16/12 0609 135/71 mmHg 98.5 F (36.9 C) Oral 100 18 97 %  09/15/12 2122 123/67 mmHg 98.9 F (37.2 C) Oral 100 18 95 %  09/15/12 1400 125/70 mmHg 98.1 F (36.7 C) - 111 18 96 %      Intake/Output from previous day:   07/24 0701 - 07/25 0700 In: -  Out: 1450 [Urine:1450]   Intake/Output this shift:       Intake/Output     07/24 0701 - 07/25 0700 07/25 0701 - 07/26 0700   P.O.     I.V.     Total Intake       Urine 1450    Total Output 1450     Net -1450             LABORATORY DATA:  Recent Labs  09/09/12 1340 09/14/12 0405 09/15/12 0553 09/16/12 0540  WBC 8.0 7.4 8.5 8.6  HGB 15.2 10.4* 10.5* 10.2*  HCT 44.7 31.3* 30.9* 30.4*  PLT 300 237 235 243    Recent Labs  09/09/12 1340 09/14/12 0405 09/15/12 0553 09/16/12 0540  NA 137 136 133* 135  K 4.0 3.8 3.8 3.9  CL 100 100 97 96  CO2 27 26 28 29   BUN 16 11 5* 9  CREATININE 0.69 0.60 0.50 0.49*  GLUCOSE 90 113* 94 104*  CALCIUM 9.8 8.6 8.8 8.9   Lab Results  Component Value Date   INR 0.91 09/09/2012   INR 1.78* 07/07/2012   INR 1.64* 07/06/2012    Recent Radiographic Studies :  Chest 2 View  09/09/2012   *RADIOLOGY REPORT*  Clinical Data:  Preoperative respiratory exam for knee replacement surgery.  CHEST - 2 VIEW  Comparison:  07/04/2012  Findings: The heart size and mediastinal contours are within normal limits.  Both lungs are clear.  The visualized skeletal structures are unremarkable.  IMPRESSION: No active disease.   Original Report Authenticated By: Irish Lack, M.D.   Dg C-arm 1-60 Min-no Report  09/13/2012   CLINICAL DATA: Infected Right Total Knee Arthroplasty, Status Post  Irrigation and Debridement with Antibiotic Spacer.   C-ARM 1-60 MINUTES  Fluoroscopy was utilized by the requesting physician.  No radiographic  interpretation.      Examination:  General appearance: alert, cooperative, mild distress and moderate distress Resp: clear to auscultation bilaterally Cardio: regular rate and rhythm GI: normal findings: bowel sounds normal  Wound Exam: clean, dry, intact   Drainage:  None: wound tissue dry  Motor Exam: EHL, FHL, Anterior Tibial and Posterior Tibial Intact  Sensory Exam: Superficial Peroneal, Deep Peroneal and Tibial normal  Vascular Exam: Right dorsalis pedis artery has 2+ (normal) pulse  Assessment:    3 Days Post-Op  Procedure(s) (LRB): TOTAL KNEE REVISION (Right)  ADDITIONAL DIAGNOSIS:  Active Problems:   * No active hospital problems. *     Plan: Physical Therapy as ordered Partial Weight Bearing @ 50% (PWB)  DVT Prophylaxis:  Xarelto, Foot Pumps and TED hose  DISCHARGE PLAN: Home  DISCHARGE NEEDS: HHPT, CPM, Walker and 3-in-1 comode seat         Tatyanna Cronk 09/16/2012 8:21 AM

## 2012-09-16 NOTE — Progress Notes (Signed)
Occupational Therapy Treatment Patient Details Name: BEAUFORD LANDO MRN: [id number] DOB: 1955/09/05 Today's Date: 09/16/2012 Time: 4098-1191 OT Time Calculation (min): 46 min  OT Assessment / Plan / Recommendation          OT comments  Pt. In cpm machine upon arrival.  Reports being in it approx. 1.5 hours and requests it to be taken off due to c/o pain.  Pt. With max encouragement for all initiation of functional movement.  Pt. Demonstrates ability to move b les but insists on assistance for rle assistance secondary to increased c/o pain.  Moves slow and con't. To decline therapist rec. For transfer tech.  Declined lb dressing states wife will assist.  Note d/c home later today.  Follow Up Recommendations  Supervision/Assistance - 24 hour           Equipment Recommendations  None recommended by OT        Frequency Min 2X/week   Progress towards OT Goals Progress towards OT goals: Progressing toward goals  Plan Discharge plan remains appropriate    Precautions / Restrictions Precautions Precautions: Fall;Knee Restrictions RLE Weight Bearing: Partial weight bearing   Pertinent Vitals/Pain 7/10, repositioned and encouraged movement    ADL  Lower Body Dressing: Performed;+1 Total assistance;Other (comment) (pt. refused attempts states wife to assist) Toilet Transfer: Simulated;Minimal assistance Transfers/Ambulation Related to ADLs: min/mod a to stabize walker due to pt.s method for sit/stand, relies heavily on keeping b les straight and pulling up to standing position ADL Comments: simulated bsc transfer using eob to recliner with approx. 4-5 steps.  pt. moves slow secondary to increased c/o of pain.  declined lb dressing states wife will assist and that "he has his own way of doing things"      OT Goals(current goals can now be found in the care plan section)    Visit Information  Last OT Received On: 09/16/12               Cognition   Cognition Arousal/Alertness: Awake/alert Behavior During Therapy: [facility] for tasks assessed/performed Overall Cognitive Status: Within Functional Limits for tasks assessed    Mobility  Bed Mobility Supine to Sit: 4: Min assist;With rails Details for Bed Mobility Assistance: assistance to initiate rle movement in pt. directed incriments, max encouragement for pt. to assist with moving les Transfers Transfers: Sit to Stand;Stand to Sit Sit to Stand: 4: Min assist;With upper extremity assist;From bed;From elevated surface Stand to Sit: 4: Min guard;With upper extremity assist;With armrests;To elevated surface;To chair/3-in-1 Details for Transfer Assistance: pt. requests bed elevated to simulate his bed height at home.  decreased knee flexion, con't. to keep b les locked using b ues to pull and lower self.                End of Session OT - End of Session Activity Tolerance: Patient limited by pain Patient left: in chair;with call bell/phone within reach       Robet Leu, COTA/L 09/16/2012, 8:39 AM

## 2012-09-18 LAB — ANAEROBIC CULTURE: Gram Stain: NONE SEEN

## 2012-09-20 ENCOUNTER — Encounter: Payer: Self-pay | Admitting: Infectious Diseases

## 2012-12-29 ENCOUNTER — Other Ambulatory Visit: Payer: Self-pay

## 2016-06-08 ENCOUNTER — Telehealth: Payer: Self-pay | Admitting: Radiology

## 2016-06-08 NOTE — Telephone Encounter (Signed)
Thanks I have left mssg for him to call us back so we can advise, to you in case you get his call

## 2016-06-08 NOTE — Telephone Encounter (Signed)
Usual course of events is that the patient submits all supporting documents from all treating physicians to SS disability.They have a hearing and determine eligibility. Physicians writing letters usually have no bearing on determination unless of some unusual problem. I would think with his problems he would qualify. Always happy to call

## 2016-06-08 NOTE — Telephone Encounter (Signed)
Patient has dropped off a letter, wants you to write a letter for him to use for his SS disability claim. 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 Are you able to provide a letter or do you want [place] to let him know if he applies we will send records?

## 2016-06-11 NOTE — Telephone Encounter (Signed)
LVM for Tori.

## 2017-04-23 ENCOUNTER — Ambulatory Visit (INDEPENDENT_AMBULATORY_CARE_PROVIDER_SITE_OTHER): Payer: BC Managed Care – PPO | Admitting: Orthopaedic Surgery

## 2017-04-23 ENCOUNTER — Encounter (INDEPENDENT_AMBULATORY_CARE_PROVIDER_SITE_OTHER): Payer: Self-pay | Admitting: Orthopaedic Surgery

## 2017-04-23 ENCOUNTER — Ambulatory Visit (INDEPENDENT_AMBULATORY_CARE_PROVIDER_SITE_OTHER): Payer: Self-pay

## 2017-04-23 VITALS — BP 107/78 | HR 80 | Resp 14 | Ht 69.0 in | Wt 187.0 lb

## 2017-04-23 DIAGNOSIS — M25521 Pain in right elbow: Secondary | ICD-10-CM

## 2017-04-23 MED ORDER — TRAMADOL HCL 50 MG PO TABS: 50.0000 mg | ORAL_TABLET | Freq: Three times a day (TID) | ORAL | 0 refills | Status: DC | PRN

## 2017-04-23 NOTE — Progress Notes (Signed)
Office Visit Note   Patient: Roy [name]           Date of Birth: 29-Oct-1955           MRN: [id number] Visit Date: 04/23/2017              Requested by: Barbie BannerWilson, Fred H, MD [address] [place], [place] [id number] PCP: Barbie BannerWilson, Fred H, MD   Assessment & Plan: Visit Diagnoses:  1. Pain in right elbow     Plan: Nondisplaced fracture olecranon right elbow. We'll plan to use a sling and tramadol for pain. Follow-up in 2-3 weeks if still uncomfortable and consider repeat films  Follow-Up Instructions: Return if symptoms worsen or fail to improve.   Orders:  Orders Placed This Encounter  Procedures  . XR Elbow Complete Right (3+View)   Meds ordered this encounter  Medications  . traMADol (ULTRAM) 50 MG tablet    Sig: Take 1 tablet (50 mg total) by mouth 3 (three) times daily as needed.    Dispense:  30 tablet    Refill:  0      Procedures: No procedures performed   Clinical Data: No additional findings.   Subjective: Chief Complaint  Patient presents with  . Right Elbow - Pain, Numbness, Edema, Injury, Weakness    Roy [name] is a 62 y o here today for an injury yesterday when his dog pulled him down. He used ice, ibuprofen and rested R elbow.  Roy [name] is well-known to me. He has a history of psoriatic arthritis with multiple joint involvement. He is now retired from [employer]. He fell yesterday while walking his dog and landed on his right elbow. The elbow was already compromised with significant arthritis and stiffness. He is having a fair amount of pain and swellingin the office. No numbness or tingling.  HPI  Review of Systems  Constitutional: Negative for fatigue.  HENT: Negative for hearing loss.   Respiratory: Positive for shortness of breath. Negative for apnea and chest tightness.   Cardiovascular: Negative for chest pain, palpitations and leg swelling.  Gastrointestinal: Negative for blood in stool, constipation and diarrhea.  Genitourinary:  Negative for difficulty urinating.  Musculoskeletal: Negative for arthralgias, back pain, joint swelling, myalgias, neck pain and neck stiffness.  Skin: Positive for color change.  Neurological: Positive for weakness. Negative for numbness and headaches.  Hematological: Does not bruise/bleed easily.  Psychiatric/Behavioral: Positive for sleep disturbance. The patient is not nervous/anxious.      Objective: Vital Signs: BP 107/78   Pulse 80   Resp 14   Ht 5\' 9"  (1.753 m)   Wt 187 lb (84.8 kg)   BMI 27.62 kg/m   Physical Exam  Ortho Exam awake alert and oriented 3. Comfortable sitting. Localized tenderness over the olecranon left elbow. Minimal range of motion on a chronic basis. Has very minimal movement about 20 of extension from neutral. Good grip and good release. Good capillary refill to fingers. Minimal pronation supination neutral. Multiple psoriatic skin lesions  Specialty Comments:  No specialty comments available.  Imaging: Xr Elbow Complete Right (3+view)  Result Date: 04/23/2017 Films of the right elbow demonstrate significant arthritic changes related to his psoriatic arthritis. There is near fusion of the joint. There is a nondisplaced fracture of the olecranon as a result of this fall yesterday    PMFS History: Patient Active Problem List   Diagnosis Date Noted  . Osteomyelitis of knee region (HCC) 08/03/2012  . Psoriatic arthritis, destructive type (HCC)  07/07/2012   Past Medical History:  Diagnosis Date  . Arthritis   . Drug addict (HCC)    Clean for 20 years  . Psoriatic arthritis (HCC)   . Seasonal allergies     History reviewed. No pertinent family history.  Past Surgical History:  Procedure Laterality Date  . EXCISIONAL TOTAL KNEE ARTHROPLASTY WITH ANTIBIOTIC SPACERS Right 07/07/2012   Procedure: EXCISIONAL TOTAL KNEE ARTHROPLASTY WITH ANTIBIOTIC SPACERS;  Surgeon: Valeria Batman, MD;  Location: [facility] OR;  Service: Orthopedics;  Laterality: Right;   Irrigation and Debridement Right Total Knee Replacement, Insertion of ABX Spacer  . KNEE ARTHROSCOPY Right 07/04/2012   Procedure: ARTHROSCOPY I&D KNEE;  Surgeon: Nadara Mustard, MD;  Location: [facility] OR;  Service: Orthopedics;  Laterality: Right;  . KNEE SURGERY    . ORIF TIBIA FRACTURE    . TOTAL HIP ARTHROPLASTY    . TOTAL KNEE ARTHROPLASTY Right 09/12/2012   redo from 2002 sugery  . TOTAL KNEE REVISION Right 09/13/2012   Procedure: TOTAL KNEE REVISION;  Surgeon: Valeria Batman, MD;  Location: [facility] OR;  Service: Orthopedics;  Laterality: Right;  Revision Arthroplasty Right Knee   Social History   Occupational History  . Not on file  Tobacco Use  . Smoking status: Former Smoker    Types: Cigarettes    Last attempt to quit: 07/05/1992    Years since quitting: 24.8  . Smokeless tobacco: Never Used  Substance and Sexual Activity  . Alcohol use: No    Comment: recovering addict  . Drug use: No    Comment: recovering drug addict 20 years clean  . Sexual activity: Not on file

## 2017-07-13 ENCOUNTER — Encounter: Payer: Self-pay | Admitting: Orthopaedic Surgery

## 2019-06-29 ENCOUNTER — Ambulatory Visit (INDEPENDENT_AMBULATORY_CARE_PROVIDER_SITE_OTHER): Payer: Medicare Other

## 2019-06-29 ENCOUNTER — Ambulatory Visit (INDEPENDENT_AMBULATORY_CARE_PROVIDER_SITE_OTHER): Payer: Medicare Other | Admitting: Orthopaedic Surgery

## 2019-06-29 ENCOUNTER — Encounter: Payer: Self-pay | Admitting: Orthopaedic Surgery

## 2019-06-29 ENCOUNTER — Other Ambulatory Visit: Payer: Self-pay

## 2019-06-29 VITALS — Ht 66.0 in | Wt 225.0 lb

## 2019-06-29 DIAGNOSIS — M25552 Pain in left hip: Secondary | ICD-10-CM

## 2019-06-29 DIAGNOSIS — M1612 Unilateral primary osteoarthritis, left hip: Secondary | ICD-10-CM | POA: Diagnosis not present

## 2019-06-29 NOTE — Progress Notes (Signed)
Office Visit Note   Patient: Roy [name]           Date of Birth: 22-Sep-1955           MRN: [id number] Visit Date: 06/29/2019              Requested by: [name], MD [address] [place],  [place] [address] PCP: [name], MD   Assessment & Plan: Visit Diagnoses:  1. Pain in left hip   2. Unilateral primary osteoarthritis, left hip     Plan: Virl Diamond has end-stage osteoarthritis of his left hip.  He has had a prior right total hip replacement approximately 20 years ago and doing well despite x-rays demonstrating polyethylene wear.  He is also had bilateral total knee replacements related to his psoriatic arthritis.  He would like to have a cortisone injection in his right hip.  I will schedule this with Dr. Alvester Morin.  We have discussed hip replacement surgery at some point in the future.  He has developed significant stiffness in any joint that has had prior surgery involving his elbows and both of his knees  Follow-Up Instructions: Return We will schedule intra-articular cortisone injection left hip with Dr. Alvester Morin.   Orders:  Orders Placed This Encounter  Procedures  . XR HIP UNILAT W OR W/O PELVIS 2-3 VIEWS LEFT  . Ambulatory referral to Physical Medicine Rehab   No orders of the defined types were placed in this encounter.     Procedures: No procedures performed   Clinical Data: No additional findings.   Subjective: Chief Complaint  Patient presents with  . Left Hip - Pain  Patient presents today for left hip pain. He has been hurting for two months. No known injury. His pain is located in his groin. No pain radiates down his leg. No numbness or tingling. No lower back pain. He said that his pain is "okay in the morning". The pain is worse with prolonged weightbearing and in the evening. He takes Aleve for pain. The pain also seems to be worse with bad weather. He also states that he has psoriatic arthritis. He has a history of a right total hip  arthroplasty 20years ago.  Presently retired from the teaching profession.  Uses a cane to help with his ambulation.  He has developed significant stiffness of both of his knees related to his psoriatic arthritis  HPI  Review of Systems  Constitutional: Negative for fatigue.  HENT: Negative for ear pain.   Eyes: Negative for pain.  Respiratory: Negative for shortness of breath.   Cardiovascular: Negative for leg swelling.  Gastrointestinal: Negative for constipation and diarrhea.  Endocrine: Negative for cold intolerance and heat intolerance.  Genitourinary: Negative for difficulty urinating.  Musculoskeletal: Positive for joint swelling.  Skin: Positive for rash.  Allergic/Immunologic: Negative for food allergies.  Neurological: Negative for weakness.  Hematological: Does not bruise/bleed easily.  Psychiatric/Behavioral: Negative for sleep disturbance.     Objective: Vital Signs: Ht 5\' 6"  (1.676 m)   Wt 225 lb (102.1 kg)   BMI 36.32 kg/m   Physical Exam Constitutional:      Appearance: He is well-developed.  Eyes:     Pupils: Pupils are equal, round, and reactive to light.  Pulmonary:     Effort: Pulmonary effort is normal.  Skin:    General: Skin is warm and dry.  Neurological:     Mental Status: He is alert and oriented to person, place, and time.  Psychiatric:        Behavior: Behavior normal.     Ortho Exam awake alert and oriented x3.  Comfortable sitting has gained quite a bit of weight.  No pain referable to his right hip but has significant limitation of motion on the left.  Has significant limited motion of both of his knees which has progressed since he has had his knee replacements years ago.  Motor and sensory exam intact.  Ambulates with a cane  Specialty Comments:  No specialty comments available.  Imaging: XR HIP UNILAT W OR W/O PELVIS 2-3 VIEWS LEFT  Result Date: 06/29/2019 AP pelvis and lateral of the left hip were obtained demonstrating  significant degenerative arthritis of the left hip with peripheral osteophytes, subchondral sclerosis, small subchondral cysts and narrowing of the joint line.  These are all consistent with advanced osteoarthritis.  Patient has a history of psoriatic arthritis. Approximately 20 years status post primary right total hip replacement.  There is polyethylene wear with eccentric position of the femoral head but patient is asymptomatic on the right    PMFS History: Patient Active Problem List   Diagnosis Date Noted  . Unilateral primary osteoarthritis, left hip 06/29/2019  . Osteomyelitis of knee region ([place]) 08/03/2012  . Psoriatic arthritis, destructive type ([place]) 07/07/2012   Past Medical History:  Diagnosis Date  . Arthritis   . Drug addict ([facility])    Clean for 20 years  . Psoriatic arthritis ([place])   . Seasonal allergies     History reviewed. No pertinent family history.  Past Surgical History:  Procedure Laterality Date  . EXCISIONAL TOTAL KNEE ARTHROPLASTY WITH ANTIBIOTIC SPACERS Right 07/07/2012   Procedure: EXCISIONAL TOTAL KNEE ARTHROPLASTY WITH ANTIBIOTIC SPACERS;  Surgeon: Garald Balding, MD;  Location: [place];  Service: Orthopedics;  Laterality: Right;  Irrigation and Debridement Right Total Knee Replacement, Insertion of ABX Spacer  . KNEE ARTHROSCOPY Right 07/04/2012   Procedure: ARTHROSCOPY I&D KNEE;  Surgeon: Newt Minion, MD;  Location: [facility];  Service: Orthopedics;  Laterality: Right;  . KNEE SURGERY    . ORIF TIBIA FRACTURE    . TOTAL HIP ARTHROPLASTY    . TOTAL KNEE ARTHROPLASTY Right 09/12/2012   redo from 2002 sugery  . TOTAL KNEE REVISION Right 09/13/2012   Procedure: TOTAL KNEE REVISION;  Surgeon: Garald Balding, MD;  Location: [place] [facility];  Service: Orthopedics;  Laterality: Right;  Revision Arthroplasty Right Knee   Social History   Occupational History  . Not on file  Tobacco Use  . Smoking status: Former Smoker    Types: Cigarettes    Quit date: 07/05/1992     Years since quitting: 27.0  . Smokeless tobacco: Never Used  Substance and Sexual Activity  . Alcohol use: No    Comment: recovering addict  . Drug use: No    Comment: recovering drug addict 20 years clean  . Sexual activity: Not on file

## 2019-07-06 ENCOUNTER — Encounter: Payer: Self-pay | Admitting: Physical Medicine and Rehabilitation

## 2019-07-06 ENCOUNTER — Ambulatory Visit (INDEPENDENT_AMBULATORY_CARE_PROVIDER_SITE_OTHER): Payer: Medicare Other | Admitting: Physical Medicine and Rehabilitation

## 2019-07-06 ENCOUNTER — Ambulatory Visit: Payer: Self-pay

## 2019-07-06 ENCOUNTER — Other Ambulatory Visit: Payer: Self-pay

## 2019-07-06 DIAGNOSIS — M25552 Pain in left hip: Secondary | ICD-10-CM | POA: Diagnosis not present

## 2019-07-06 NOTE — Progress Notes (Signed)
Pt states pain in left hip (no groin pain) and left thigh. Pt states pain started a few weeks ago. Pt states cold rainy weather makes pain worse. Aleve helps with pain.    .Numeric Pain Rating Scale and Functional Assessment Average Pain 8   In the last MONTH (on 0-10 scale) has pain interfered with the following?  1. General activity like being  able to carry out your everyday physical activities such as walking, climbing stairs, carrying groceries, or moving a chair?  Rating(8)   -Dye Allergies.

## 2019-07-06 NOTE — Progress Notes (Signed)
   BART ASHFORD - 64 y.o. male MRN [id number]  Date of birth: [birth year]  Office Visit Note: Visit Date: 07/06/2019 PCP: [name], MD Referred by: [name], MD  Subjective: Chief Complaint  Patient presents with  . Left Hip - Pain  . Left Thigh - Pain   HPI:  KHORI ROSEVEAR is a 64 y.o. male who comes in today At the request of Dr. [name] with end-stage osteoarthritis of his left hip.  He has had a prior right total hip replacement approximately 20 years ago.  He also has had bilateral total knee replacements related to his psoriatic arthritis.  He does have painful range of motion of the left hip with decreased range of motion of all of his joints in general.  He ambulates with a very stiff gait with a cane.  ROS Otherwise per HPI.  Assessment & Plan: Visit Diagnoses:  1. Pain in left hip     Plan: No additional findings.   Meds & Orders: No orders of the defined types were placed in this encounter.   Orders Placed This Encounter  Procedures  . Large Joint Inj: L hip joint  . XR C-ARM NO REPORT    Follow-up: No follow-ups on file.   Procedures: Large Joint Inj: L hip joint on 07/06/2019 2:33 PM Indications: pain and diagnostic evaluation Details: 22 G needle, anterior approach  Arthrogram: Yes  Medications: 4 mL bupivacaine 0.25 %; 60 mg triamcinolone acetonide 40 MG/ML Outcome: tolerated well, no immediate complications  Arthrogram demonstrated excellent flow of contrast throughout the joint surface without extravasation or obvious defect.  The patient had relief of symptoms during the anesthetic phase of the injection.  Procedure, treatment alternatives, risks and benefits explained, specific risks discussed. Consent was given by the patient. Immediately prior to procedure a time out was called to verify the correct patient, procedure, equipment, support staff and site/side marked as required. Patient was prepped and draped in the usual sterile  fashion.      No notes on file   Clinical History: No specialty comments available.     Objective:  VS:  HT:    WT:   BMI:     BP:   HR: bpm  TEMP: ( )  RESP:  Physical Exam   Imaging: XR C-ARM NO REPORT  Result Date: 07/06/2019 Please see Notes tab for imaging impression.

## 2019-07-07 MED ORDER — BUPIVACAINE HCL 0.25 % IJ SOLN
4.0000 mL | INTRAMUSCULAR | Status: AC | PRN
  Administered 2019-07-06: 4 mL via INTRA_ARTICULAR

## 2019-07-07 MED ORDER — TRIAMCINOLONE ACETONIDE 40 MG/ML IJ SUSP
60.0000 mg | INTRAMUSCULAR | Status: AC | PRN
Start: 2019-07-06 — End: 2019-07-06
  Administered 2019-07-06: 60 mg via INTRA_ARTICULAR

## 2019-09-08 ENCOUNTER — Encounter: Payer: Self-pay | Admitting: Orthopaedic Surgery

## 2019-09-08 ENCOUNTER — Ambulatory Visit (INDEPENDENT_AMBULATORY_CARE_PROVIDER_SITE_OTHER): Payer: Medicare Other | Admitting: Orthopaedic Surgery

## 2019-09-08 DIAGNOSIS — M1612 Unilateral primary osteoarthritis, left hip: Secondary | ICD-10-CM | POA: Diagnosis not present

## 2019-09-08 NOTE — Progress Notes (Signed)
Office Visit Note   Patient: Roy [name]           Date of Birth: Jul 14, 1955           MRN: [id number] Visit Date: 09/08/2019              Requested by: [name], MD [address] [place],  [place] [address] PCP: [name], MD   Assessment & Plan: Visit Diagnoses:  1. Unilateral primary osteoarthritis, left hip     Plan: Roy [name] has advanced osteoarthritis of his left hip.  In part this may be related to his psoriatic arthritis.  Dr. Alvester Morin injected the hip several months ago with significant relief of his pain.  He like to have another injection.  We will set this up with Dr. Alvester Morin.  Roy [name] is fully aware that definitive procedure would be hip replacement but he is not quite ready for that yet  Follow-Up Instructions: Return if symptoms worsen or fail to improve.   Orders:  Orders Placed This Encounter  Procedures  . Ambulatory referral to Physical Medicine Rehab   No orders of the defined types were placed in this encounter.     Procedures: No procedures performed   Clinical Data: No additional findings.   Subjective: Chief Complaint  Patient presents with  . Left Hip - Pain  Just over 2 months status post left hip injection by Dr. Alvester Morin for end-stage osteoarthritis.  Had excellent relief of his pain until just recently.  Roy [name] would like another injection.  He is fully aware that definitive procedure would be hip replacement but he is not quite ready for that yet.  HPI  Review of Systems   Objective: Vital Signs: There were no vitals taken for this visit.  Physical Exam Constitutional:      Appearance: He is well-developed.  Eyes:     Pupils: Pupils are equal, round, and reactive to light.  Pulmonary:     Effort: Pulmonary effort is normal.  Skin:    General: Skin is warm and dry.  Neurological:     Mental Status: He is alert and oriented to person, place, and time.  Psychiatric:        Behavior: Behavior normal.     Ortho  Exam significant decrease in motion of his left hip related to his arthritis.  Motor exam appears to be intact.  Has an awkward gait related to his prior knee replacements with stiffness and the arthritis in his left hip. Specialty Comments:  No specialty comments available.  Imaging: No results found.   PMFS History: Patient Active Problem List   Diagnosis Date Noted  . Unilateral primary osteoarthritis, left hip 06/29/2019  . Osteomyelitis of knee region (HCC) 08/03/2012  . 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 Arthritis   . Drug addict (HCC)    Clean for 20 years  . Psoriatic arthritis (HCC)   . Seasonal allergies     History reviewed. No pertinent family history.  Past Surgical History:  Procedure Laterality Date  . EXCISIONAL TOTAL KNEE ARTHROPLASTY WITH ANTIBIOTIC SPACERS Right 07/07/2012   Procedure: EXCISIONAL TOTAL KNEE ARTHROPLASTY WITH ANTIBIOTIC SPACERS;  Surgeon: Valeria Batman, MD;  Location: [facility] OR;  Service: Orthopedics;  Laterality: Right;  Irrigation and Debridement Right Total Knee Replacement, Insertion of ABX Spacer  . KNEE ARTHROSCOPY Right 07/04/2012   Procedure: ARTHROSCOPY I&D KNEE;  Surgeon: Nadara Mustard, MD;  Location: [facility] OR;  Service: Orthopedics;  Laterality: Right;  . KNEE SURGERY    . ORIF TIBIA FRACTURE    . TOTAL HIP ARTHROPLASTY    . TOTAL KNEE ARTHROPLASTY Right 09/12/2012   redo from 2002 sugery  . TOTAL KNEE REVISION Right 09/13/2012   Procedure: TOTAL KNEE REVISION;  Surgeon: Valeria Batman, MD;  Location: [facility] OR;  Service: Orthopedics;  Laterality: Right;  Revision Arthroplasty Right Knee   Social History   Occupational History  . Not on file  Tobacco Use  . Smoking status: Former Smoker    Types: Cigarettes    Quit date: 07/05/1992    Years since quitting: 27.1  . Smokeless tobacco: Never Used  Substance and Sexual Activity  . Alcohol use: No    Comment: recovering  addict  . Drug use: No    Comment: recovering drug addict 20 years clean  . Sexual activity: Not on file     Valeria Batman, MD   Note - This record has been created using [employer].  Chart creation errors have been sought, but may not always  have been located. Such creation errors do not reflect on  the standard of medical care.

## 2019-09-12 ENCOUNTER — Other Ambulatory Visit: Payer: Self-pay

## 2019-09-12 ENCOUNTER — Ambulatory Visit (INDEPENDENT_AMBULATORY_CARE_PROVIDER_SITE_OTHER): Payer: Medicare Other | Admitting: Physical Medicine and Rehabilitation

## 2019-09-12 ENCOUNTER — Encounter: Payer: Self-pay | Admitting: Physical Medicine and Rehabilitation

## 2019-09-12 ENCOUNTER — Ambulatory Visit: Payer: Self-pay

## 2019-09-12 DIAGNOSIS — M25552 Pain in left hip: Secondary | ICD-10-CM

## 2019-09-12 MED ORDER — BUPIVACAINE HCL 0.25 % IJ SOLN
4.0000 mL | INTRAMUSCULAR | Status: AC | PRN
  Administered 2019-09-12: 4 mL via INTRA_ARTICULAR

## 2019-09-12 MED ORDER — TRIAMCINOLONE ACETONIDE 40 MG/ML IJ SUSP
60.0000 mg | INTRAMUSCULAR | Status: AC | PRN
Start: 2019-09-12 — End: 2019-09-12
  Administered 2019-09-12: 60 mg via INTRA_ARTICULAR

## 2019-09-12 NOTE — Progress Notes (Signed)
Roy [name] - 64 y.o. male MRN [id number]  Date of birth: 22-Jun-1955  Office Visit Note: Visit Date: 09/12/2019 PCP: [name], MD Referred by: [name], MD  Subjective: Chief Complaint  Patient presents with  . Left Hip - Pain   HPI:  Roy [name] is a 63 y.o. male who comes in today at the request of Dr. [name] for planned Left anesthetic hip arthrogram with fluoroscopic guidance.  The patient has failed conservative care including home exercise, medications, time and activity modification.  This injection will be diagnostic and hopefully therapeutic.  Please see requesting physician notes for further details and justification.   ROS Otherwise per HPI.  Assessment & Plan: Visit Diagnoses:  1. Pain in left hip     Plan: No additional findings.   Meds & Orders: No orders of the defined types were placed in this encounter.   Orders Placed This Encounter  Procedures  . Large Joint Inj  . XR C-ARM NO REPORT    Follow-up: Return for [name], MD, visit to requesting physician as needed.   Procedures: Large Joint Inj: L hip joint on 09/12/2019 8:36 AM Indications: diagnostic evaluation and pain Details: 22 G 3.5 in needle, fluoroscopy-guided anterior approach  Arthrogram: No  Medications: 4 mL bupivacaine 0.25 %; 60 mg triamcinolone acetonide 40 MG/ML Outcome: tolerated well, no immediate complications  There was excellent flow of contrast producing a partial arthrogram of the hip. The patient did have relief of symptoms during the anesthetic phase of the injection. Procedure, treatment alternatives, risks and benefits explained, specific risks discussed. Consent was given by the patient. Immediately prior to procedure a time out was called to verify the correct patient, procedure, equipment, support staff and site/side marked as required. Patient was prepped and draped in the usual sterile fashion.      No notes on file   Clinical  History: No specialty comments available.     Objective:  VS:  HT:    WT:   BMI:     BP:   HR: bpm  TEMP: ( )  RESP:  Physical Exam Constitutional:      General: He is not in acute distress.    Appearance: Normal appearance. He is not ill-appearing.  HENT:     Head: Normocephalic and atraumatic.     Right Ear: External ear normal.     Left Ear: External ear normal.  Eyes:     Extraocular Movements: Extraocular movements intact.  Cardiovascular:     Rate and Rhythm: Normal rate.     Pulses: Normal pulses.  Abdominal:     General: There is no distension.     Palpations: Abdomen is soft.  Musculoskeletal:        General: No tenderness or signs of injury.     Right lower leg: No edema.     Left lower leg: No edema.     Comments: Patient has good distal strength without clonus. Painful ROM of left hip.  Skin:    Findings: No erythema or rash.  Neurological:     General: No focal deficit present.     Mental Status: He is alert and oriented to person, place, and time.     Sensory: No sensory deficit.     Motor: No weakness or abnormal muscle tone.     Coordination: Coordination normal.  Psychiatric:        Mood and Affect: Mood normal.  Behavior: Behavior normal.      Imaging: No results found.

## 2019-09-12 NOTE — Progress Notes (Signed)
Here for repeat left hip injection. Left groin pain. Last injection worked very well for about 60 days. Numeric Pain Rating Scale and Functional Assessment Average Pain 7   In the last MONTH (on 0-10 scale) has pain interfered with the following?  1. General activity like being  able to carry out your everyday physical activities such as walking, climbing stairs, carrying groceries, or moving a chair?  Rating(10)    -Dye Allergies.

## 2019-11-18 ENCOUNTER — Emergency Department (HOSPITAL_COMMUNITY)
Admission: EM | Admit: 2019-11-18 | Discharge: 2019-11-18 | Disposition: A | Discharge status: Home / Self Care | Payer: Medicare Other | Attending: Emergency Medicine | Admitting: Emergency Medicine

## 2019-11-18 ENCOUNTER — Emergency Department (HOSPITAL_COMMUNITY): Payer: Medicare Other

## 2019-11-18 ENCOUNTER — Other Ambulatory Visit: Payer: Self-pay

## 2019-11-18 DIAGNOSIS — S52002A Unspecified fracture of upper end of left ulna, initial encounter for closed fracture: Secondary | ICD-10-CM

## 2019-11-18 DIAGNOSIS — Z87891 Personal history of nicotine dependence: Secondary | ICD-10-CM | POA: Insufficient documentation

## 2019-11-18 DIAGNOSIS — W010XXA Fall on same level from slipping, tripping and stumbling without subsequent striking against object, initial encounter: Secondary | ICD-10-CM | POA: Diagnosis not present

## 2019-11-18 DIAGNOSIS — Z96643 Presence of artificial hip joint, bilateral: Secondary | ICD-10-CM | POA: Insufficient documentation

## 2019-11-18 DIAGNOSIS — S81812A Laceration without foreign body, left lower leg, initial encounter: Secondary | ICD-10-CM | POA: Diagnosis not present

## 2019-11-18 DIAGNOSIS — Z96651 Presence of right artificial knee joint: Secondary | ICD-10-CM | POA: Insufficient documentation

## 2019-11-18 DIAGNOSIS — S8992XA Unspecified injury of left lower leg, initial encounter: Secondary | ICD-10-CM | POA: Diagnosis present

## 2019-11-18 DIAGNOSIS — W19XXXA Unspecified fall, initial encounter: Secondary | ICD-10-CM

## 2019-11-18 MED ORDER — LIDOCAINE-EPINEPHRINE 1 %-1:100000 IJ SOLN
10.0000 mL | Freq: Once | INTRAMUSCULAR | Status: AC
  Administered 2019-11-18: 1 mL via INTRADERMAL
  Filled 2019-11-18: qty 1

## 2019-11-18 MED ORDER — LIDOCAINE HCL (PF) 1 % IJ SOLN
INTRAMUSCULAR | Status: AC
  Filled 2019-11-18: qty 5

## 2019-11-18 MED ORDER — DOXYCYCLINE HYCLATE 100 MG PO TABS
100.0000 mg | ORAL_TABLET | Freq: Once | ORAL | Status: AC
  Administered 2019-11-18: 100 mg via ORAL
  Filled 2019-11-18: qty 1

## 2019-11-18 MED ORDER — ACETAMINOPHEN-CODEINE #3 300-30 MG PO TABS: 1.0000 | ORAL_TABLET | Freq: Three times a day (TID) | ORAL | 0 refills | Status: DC | PRN

## 2019-11-18 MED ORDER — DOXYCYCLINE HYCLATE 100 MG PO CAPS: 100.0000 mg | ORAL_CAPSULE | Freq: Two times a day (BID) | ORAL | 0 refills | Status: DC

## 2019-11-18 MED ORDER — ACETAMINOPHEN-CODEINE #3 300-30 MG PO TABS
1.0000 | ORAL_TABLET | Freq: Once | ORAL | Status: AC
  Administered 2019-11-18: 1 via ORAL
  Filled 2019-11-18: qty 1

## 2019-11-18 NOTE — Progress Notes (Signed)
Orthopedic Tech Progress Note Patient Details:  Roy [name] 1955/04/22 [id number]  Ortho Devices Type of Ortho Device: Post (long) splint, Sugartong splint, Shoulder immobilizer Splint Material: Fiberglass Ortho Device/Splint Location: Left Upper Extremity Ortho Device/Splint Interventions: Ordered, Application   Post Interventions Patient Tolerated: Well Instructions Provided: Adjustment of device, Care of device, Poper ambulation with device   [name] [name] 11/18/2019, 3:37 PM

## 2019-11-18 NOTE — ED Triage Notes (Signed)
Pt from home via ems; was on deck at home when he fell through a weak spot, cut leg on board; 5-6 in lac to L shin, small lac to L elbow; bleeding controlled on arrival, ems endorses significant blood loss on scene; pt not on thinners; denies hitting head; ambulatory on scene  154/72 P 64 97% RA RR 18 97.31F

## 2019-11-18 NOTE — ED Provider Notes (Signed)
[facility] EMERGENCY DEPARTMENT Provider Note   CSN: [id number] Arrival date & time: 11/18/19  1152     History No chief complaint on file.   Roy [name] is a 64 y.o. male who presents for evaluation of laceration noted to left lower extremity after a fall today.  He reports that earlier today, he was on his deck which he states is approximately ground-level.  He states that there was a weak spot on the back that he stepped on which caused him to go through the back.  He states that his leg got caught on either piece of wood or screw, causing a laceration.  He also reports hitting his left elbow.  He did not hit his head.  No LOC.  He is not on blood thinners.  He was able to ambulate at the scene.  Reports pain to his left elbow as well as left leg.  He thinks his tetanus is in 2017.  He denies any chest pain, neck pain, back pain, difficulty breathing, abdominal pain, vomiting, numbness/weakness of his arms or legs.  The history is provided by the patient.       Past Medical History:  Diagnosis Date  . Arthritis   . Drug addict (HCC)    Clean for 20 years  . Psoriatic arthritis (HCC)   . Seasonal allergies     Patient Active Problem List   Diagnosis Date Noted  . Unilateral primary osteoarthritis, left hip 06/29/2019  . Osteomyelitis of knee region (HCC) 08/03/2012  . Psoriatic arthritis, 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    Past Surgical History:  Procedure Laterality Date  . EXCISIONAL TOTAL KNEE ARTHROPLASTY WITH ANTIBIOTIC SPACERS Right 07/07/2012   Procedure: EXCISIONAL TOTAL KNEE ARTHROPLASTY WITH ANTIBIOTIC SPACERS;  Surgeon: Valeria Batman, MD;  Location: [facility] OR;  Service: Orthopedics;  Laterality: Right;  Irrigation and Debridement Right Total Knee Replacement, Insertion of ABX Spacer  . KNEE ARTHROSCOPY Right 07/04/2012   Procedure: ARTHROSCOPY I&D KNEE;  Surgeon: Nadara Mustard, MD;  Location: [facility];  Service: Orthopedics;  Laterality:  Right;  . KNEE SURGERY    . ORIF TIBIA FRACTURE    . TOTAL HIP ARTHROPLASTY    . TOTAL KNEE ARTHROPLASTY Right 09/12/2012   redo from 2002 sugery  . TOTAL KNEE REVISION Right 09/13/2012   Procedure: TOTAL KNEE REVISION;  Surgeon: Valeria Batman, MD;  Location: [facility] OR;  Service: Orthopedics;  Laterality: Right;  Revision Arthroplasty Right Knee       No family history on file.  Social History   Tobacco Use  . Smoking status: Former Smoker    Types: Cigarettes    Quit date: 07/05/1992    Years since quitting: 27.3  . Smokeless tobacco: Never Used  Substance Use Topics  . Alcohol use: No    Comment: recovering addict  . Drug use: No    Comment: recovering drug addict 20 years clean    Home Medications Prior to Admission medications   Medication Sig Start Date End Date Taking? Authorizing Provider  acetaminophen-codeine (TYLENOL #3) 300-30 MG tablet Take 1-2 tablets by mouth every 8 (eight) hours as needed for moderate pain. 11/18/19   Maxwell Caul, PA-C  doxycycline (VIBRAMYCIN) 100 MG capsule Take 1 capsule (100 mg total) by mouth 2 (two) times daily for 7 days. 11/18/19 11/25/19  Maxwell Caul, PA-C  ibuprofen (ADVIL,MOTRIN) 200 MG tablet Take by mouth.    [provider]  Multiple Vitamin (MULTIVITAMIN WITH  MINERALS) TABS Take 1 tablet by mouth daily.    [provider]  naproxen sodium (ALEVE) 220 MG tablet Take 220 mg by mouth.    [provider]  Skin Protectants, Misc. (EUCERIN) cream Apply 1 application topically 3 (three) times daily.    [provider]    Allergies    Demerol [meperidine], Morphine and related, and Oxycodone  Review of Systems   Review of Systems  Respiratory: Negative for shortness of breath.   Cardiovascular: Negative for chest pain.  Gastrointestinal: Negative for abdominal pain, nausea and vomiting.  Musculoskeletal:       Left elbow pain and left leg pain  Skin: Positive for wound.  Neurological:  Negative for weakness and numbness.  All other systems reviewed and are negative.   Physical Exam Updated Vital Signs BP 139/84 (BP Location: Right Arm)   Pulse 75   Temp 98 F (36.7 C) (Oral)   Resp 20   SpO2 96%   Physical Exam Vitals and nursing note reviewed.  Constitutional:      Appearance: Normal appearance. He is well-developed.  HENT:     Head: Normocephalic and atraumatic.     Comments: No tenderness to palpation of skull. No deformities or crepitus noted. No open wounds, abrasions or lacerations.  Eyes:     General: Lids are normal.     Conjunctiva/sclera: Conjunctivae normal.     Pupils: Pupils are equal, round, and reactive to light.  Cardiovascular:     Rate and Rhythm: Normal rate and regular rhythm.     Pulses: Normal pulses.          Radial pulses are 2+ on the right side and 2+ on the left side.       Dorsalis pedis pulses are 2+ on the right side and 2+ on the left side.     Heart sounds: Normal heart sounds. No murmur heard.  No friction rub. No gallop.   Pulmonary:     Effort: Pulmonary effort is normal.     Breath sounds: Normal breath sounds.     Comments: Lungs clear to auscultation bilaterally.  Symmetric chest rise.  No wheezing, rales, rhonchi. Abdominal:     Palpations: Abdomen is soft. Abdomen is not rigid.     Tenderness: There is no abdominal tenderness. There is no guarding.  Musculoskeletal:        General: Normal range of motion.     Cervical back: Full passive range of motion without pain.     Comments: Tenderness to palpation noted to the lateral aspect of the elbow with some soft tissue swelling. Limited ROM secondary to pain. No bony tenderness noted to left shoulder, left forearm, left wrist. No tenderness noted to RUE. Tenderness to palpation to the left mid tib fib. No deformity or crepitus noted. Dorsiflexion and plantar flexion intact without any difficulty.   Skin:    General: Skin is warm and dry.     Capillary Refill: Capillary  refill takes less than 2 seconds.     Comments: Large V shaped laceration measuring approximately 6 cm in length. Small abrasion noted to the elbow, distal to the joint itself. No deep laceration, no open wound.   Neurological:     Mental Status: He is alert and oriented to person, place, and time.  Psychiatric:        Speech: Speech normal.         ED Results / Procedures / Treatments   Labs (all labs  ordered are listed, but only abnormal results are displayed) Labs Reviewed - No data to display  EKG None  Radiology DG Elbow Complete Left  Result Date: 11/18/2019 CLINICAL DATA:  Recent fall with left elbow pain, initial encounter EXAM: LEFT ELBOW - COMPLETE 3+ VIEW COMPARISON:  None. FINDINGS: Proximal ulnar fracture is noted with approximately 2 cm of distraction of the fracture fragments. Joint effusion is seen. Soft tissue swelling is noted in this region. IMPRESSION: Proximal ulnar fracture as described Electronically Signed   By: Alcide Clever M.D.   On: 11/18/2019 13:31   DG Tibia/Fibula Left  Result Date: 11/18/2019 CLINICAL DATA:  Recent fall with left leg pain, initial encounter EXAM: LEFT TIBIA AND FIBULA - 2 VIEW COMPARISON:  12/21/2006 FINDINGS: Postsurgical changes are noted in the proximal tibia. No hardware failure is seen. No acute fracture is noted. Degenerative changes about the left knee joint are seen. IMPRESSION: Postsurgical changes in the proximal tibia. No acute abnormality is noted. Electronically Signed   By: Alcide Clever M.D.   On: 11/18/2019 13:30    Procedures .Marland KitchenLaceration Repair  Date/Time: 11/18/2019 2:36 PM Performed by: Maxwell Caul, PA-C Authorized by: Maxwell Caul, PA-C   Consent:    Consent obtained:  Verbal   Consent given by:  Patient   Risks discussed:  Infection, need for additional repair, pain, poor cosmetic result and poor wound healing   Alternatives discussed:  No treatment and delayed treatment Universal protocol:     Procedure explained and questions answered to patient or proxy's satisfaction: yes     Relevant documents present and verified: yes     Test results available and properly labeled: yes     Imaging studies available: yes     Required blood products, implants, devices, and special equipment available: yes     Site/side marked: yes     Immediately prior to procedure, a time out was called: yes     Patient identity confirmed:  Verbally with patient Anesthesia (see MAR for exact dosages):    Anesthesia method:  Local infiltration   Local anesthetic:  Lidocaine 2% WITH epi Laceration details:    Location:  Leg   Leg location:  L lower leg   Length (cm):  6 Repair type:    Repair type:  Intermediate Pre-procedure details:    Preparation:  Patient was prepped and draped in usual sterile fashion and imaging obtained to evaluate for foreign bodies Exploration:    Hemostasis achieved with:  Direct pressure   Wound exploration: wound explored through full range of motion     Wound extent: foreign bodies/material     Foreign bodies/material:  Dirt, debris Treatment:    Area cleansed with:  Betadine   Amount of cleaning:  Extensive   Irrigation solution:  Sterile saline   Irrigation method:  Pressure wash   Visualized foreign bodies/material removed: yes   Skin repair:    Repair method:  Staples   Number of staples:  18 Approximation:    Approximation:  Close Post-procedure details:    Dressing:  Antibiotic ointment   Patient tolerance of procedure:  Tolerated well, no immediate complications Comments:     Once wound was anesthetized, was thoroughly extensively irrigated with over 2 L of sterile saline.  There was a small amount of dirt and debris in the wound that was extensively irrigated out.  The wound was thoroughly extensively irrigatated to ensure debris was completely removed.  The area was also scrubbed with  a Betadine scrub.  Given the large extent of the wound, staples were used  to close the wound with good success.   (including critical care time)  Medications Ordered in ED Medications  acetaminophen-codeine (TYLENOL #3) 300-30 MG per tablet 1 tablet (1 tablet Oral Given 11/18/19 1324)  lidocaine-EPINEPHrine (XYLOCAINE W/EPI) 1 %-1:100000 (with pres) injection 10 mL (1 mL Intradermal Given 11/18/19 1341)  doxycycline (VIBRA-TABS) tablet 100 mg (100 mg Oral Given 11/18/19 1541)    ED Course  I have reviewed the triage vital signs and the nursing notes.  Pertinent labs & imaging results that were available during my care of the patient were reviewed by me and considered in my medical decision making (see chart for details).    MDM Rules/Calculators/A&P                           64 year old male who reports for evaluation of fall.  He reports that he was in his deck which is ground-level and states that he stepped in a part that was bad and fell through.  He states his leg got caught on either screw or piece of wood, causing a laceration.  He also reports landing on his elbow.  No head injury, LOC.  He is not on blood thinners.  He reports difficulty moving the elbow secondary to pain.  His tetanus is up-to-date.  Initially arrival, he is afebrile, nontoxic-appearing.  Vital signs are stable.  He is neurovascularly intact.  On lateral aspect of his left lower tib-fib, he has a large 6 cm V-shaped laceration that extends into the subcutaneous fat.  He has tenderness noted to the left elbow with soft tissue swelling and difficulty moving it.  He has a small abrasion noted to the elbow but no deep laceration or open wound.  Concern for fracture versus dislocation versus musculoskeletal injury.  Plan for x-rays.   I evaluated his elbow after it was cleaned. There was a small abrasion noted distally to the left elbow but no laceration to the elbow itself.   X-rays reviewed.  He has a proximal ulnar fracture.  Leg x-ray negative for any acute abnormality.  Discussed results  with patient.  Discussed with Dr. Ophelia Charter (Ortho).  He recommends placing patient in a long arm with sugar tong. Patient can follow up in the office.   Laceration was repaired as documented above.  There was a small amount of dirt that was thoroughly simply irrigated out of the wound.  The wound was irrigated with over 2 L of sterile saline to ensure there was no monitoring debris left.  Given the extensiveness of the wound, will plan for antibiotics.  Will start patient on doxycycline.  Reevaluation after splint placement.  Patient with good distal cap refill and sensation.  Patient instructed on at home supportive care measures.  He is instructed to call Dr. Doreen Salvage office on Monday to arrange for an appointment. At this time, patient exhibits no emergent life-threatening condition that require further evaluation in ED or admission. Patient had ample opportunity for questions and discussion. All patient's questions were answered with full understanding. Strict return precautions discussed. Patient expresses understanding and agreement to plan.   Portions of this note were generated with Scientist, clinical (histocompatibility and immunogenetics). Dictation errors may occur despite best attempts at proofreading.   Final Clinical Impression(s) / ED Diagnoses Final diagnoses:  Fall, initial encounter  Laceration of left lower extremity, initial encounter  Closed fracture of  proximal end of left ulna, unspecified fracture morphology, initial encounter    Rx / DC Orders ED Discharge Orders         Ordered    doxycycline (VIBRAMYCIN) 100 MG capsule  2 times daily        11/18/19 1528    acetaminophen-codeine (TYLENOL #3) 300-30 MG tablet  Every 8 hours PRN        11/18/19 1528           Rosana HoesLayden, [place] Welcome A, PA-C 11/18/19 1622    Tilden Fossaees, Elizabeth, MD 11/18/19 1934

## 2019-11-18 NOTE — Discharge Instructions (Signed)
As we discussed, your x-ray today showed that you broke the ulna of your left elbow.  You will need to follow-up with orthopedic.  Call [facility] orthopedics on Monday to arrange for an appointment and follow-up with Dr. Cleophas Dunker.  Make sure you keep the splint dry.  At home, elevate to help with swelling.  You can take Tylenol or Ibuprofen as directed for pain. You can alternate Tylenol and Ibuprofen every 4 hours. If you take Tylenol at 1pm, then you can take Ibuprofen at 5pm. Then you can take Tylenol again at 9pm.   Take pain medications as directed for break through pain. Do not drive or operate machinery while taking this medication.   Keep the wound clean and dry for the first 24 hours. After that you may gently clean the wound with soap and water. Make sure to pat dry the wound before covering it with any dressing. You can use topical antibiotic ointment and bandage. Ice and elevate for pain relief.   Return to the Emergency Department, your primary care doctor, or the [facility] in 7-10 days for suture removal.   Monitor closely for any signs of infection. Return to the Emergency Department for any worsening redness/swelling of the area that begins to spread, drainage from the site, worsening pain, fever or any other worsening or concerning symptoms.

## 2019-11-21 ENCOUNTER — Encounter: Payer: Self-pay | Admitting: Orthopaedic Surgery

## 2019-11-21 ENCOUNTER — Other Ambulatory Visit: Payer: Self-pay

## 2019-11-21 ENCOUNTER — Ambulatory Visit: Payer: Self-pay

## 2019-11-21 ENCOUNTER — Ambulatory Visit (INDEPENDENT_AMBULATORY_CARE_PROVIDER_SITE_OTHER): Payer: Medicare Other | Admitting: Orthopaedic Surgery

## 2019-11-21 VITALS — Ht 66.0 in | Wt 220.0 lb

## 2019-11-21 DIAGNOSIS — S52022A Displaced fracture of olecranon process without intraarticular extension of left ulna, initial encounter for closed fracture: Secondary | ICD-10-CM | POA: Insufficient documentation

## 2019-11-21 DIAGNOSIS — S81812A Laceration without foreign body, left lower leg, initial encounter: Secondary | ICD-10-CM | POA: Diagnosis not present

## 2019-11-21 DIAGNOSIS — R0789 Other chest pain: Secondary | ICD-10-CM

## 2019-11-21 MED ORDER — CEPHALEXIN 500 MG PO CAPS: 500.0000 mg | ORAL_CAPSULE | Freq: Four times a day (QID) | ORAL | 0 refills | Status: DC

## 2019-11-21 MED ORDER — ACETAMINOPHEN-CODEINE #3 300-30 MG PO TABS: 1.0000 | ORAL_TABLET | Freq: Three times a day (TID) | ORAL | 0 refills | Status: DC | PRN

## 2019-11-21 NOTE — H&P (View-Only) (Signed)
Office Visit Note   Patient: Roy [name]           Date of Birth: 1955/08/09           MRN: [id number] Visit Date: 11/21/2019              Requested by: [name], MD [address] [place],  [place] [address] PCP: [name], MD   Assessment & Plan: Visit Diagnoses:  1. Other chest pain   2. Olecranon fracture, left, closed, initial encounter   3. Leg laceration, left, initial encounter     Plan: Roy [name] fell through some wooden planks on Saturday i.e. 3 days ago and sustained a left leg laceration and a displaced left olecranon fracture.  He was seen in the emergency room where the wound in the left leg was cleaned and stapled.  He has been on Keflex without any evidence of infection.  He also has a displaced left olecranon fracture that appears to be pathologic and related to a large cystic structure that appears to be benign.  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  I discussed this with Dr. Ophelia Charter who has time to operate on the fracture Friday.  Dr. Ophelia Charter spoke with Roy [name]. he will biopsy the the cystic structure, debride the cyst and then pack it with bone and apply a long olecranon plate and screws.  Dr. Ophelia Charter discussed this with Roy [name].  I will renew Tylenol 3 for pain and Keflex for prophylaxis for the laceration. Follow-Up Instructions: Return Dr. Ophelia Charter to operate on olecranon Friday.   Orders:  Orders Placed This Encounter  Procedures  . XR Chest 2 View   Meds ordered this encounter  Medications  . acetaminophen-codeine (TYLENOL #3) 300-30 MG tablet    Sig: Take 1-2 tablets by mouth every 8 (eight) hours as needed for moderate pain.    Dispense:  30 tablet    Refill:  0  . cephALEXin (KEFLEX) 500 MG capsule    Sig: Take 1 capsule (500 mg total) by mouth 4 (four) times daily.    Dispense:  30 capsule    Refill:  0      Procedures: No procedures performed   Clinical Data: No additional  findings.   Subjective: Chief Complaint  Patient presents with  . Left Elbow - Pain, Injury    DOI 11/18/2019  Patient presents today for his left elbow. He fell through a deck on 11/18/2019 and went to the ED at [facility]. He had x-rays and was told he had fractured his proximal ulna. He is taking Tylenol #3 twice daily. He is currently in a splint. He also has a laceration on his left lower leg. They used staples to close the laceration. He is right handed, but has limited use of the right arm due to limited motion in his right elbow. He is currently on antibiotics for the laceration and has been changing the bandage. He has a history of hardware in that leg from an injury in the past He is walking without ambulatory aid and having just minimal discomfort.  He is a recovering addict. I reviewed the films of his elbow on the PACS system.  There was a displaced olecranon fracture over a centimeter.  There is a large cystic mass within the olecranon that is [name] likely associated with a pathologic fracture.  Appears to be benign.  Are some degenerative changes about the elbow  probably related to his history of psoriatic arthritis  HPI  Review of Systems   Objective: Vital Signs: Ht 5\' 6"  (1.676 m)   Wt 220 lb (99.8 kg)   BMI 35.51 kg/m   Physical Exam Constitutional:      Appearance: He is well-developed.  Eyes:     Pupils: Pupils are equal, round, and reactive to light.  Pulmonary:     Effort: Pulmonary effort is normal.  Skin:    General: Skin is warm and dry.  Neurological:     Mental Status: He is alert and oriented to person, place, and time.  Psychiatric:        Behavior: Behavior normal.     Ortho Exam left leg wound is in the mid lateral leg.  Staples are in place.  No drainage or evidence of active infection.  No fluctuance.  Motor exam intact.  Left upper extremity in a long-arm splint.  Mild swelling of the hand but normal sensory and motor exam. Specialty Comments:   No specialty comments available.  Imaging: XR Chest 2 View  Result Date: 11/21/2019 PA chest film was obtained and compared to chest film from 2014.  No active disease    PMFS History: Patient Active Problem List   Diagnosis Date Noted  . Olecranon fracture, left, closed, initial encounter 11/21/2019  . Leg laceration, left, initial encounter 11/21/2019  . Unilateral primary osteoarthritis, left hip 06/29/2019  . Osteomyelitis of knee region (HCC) 08/03/2012  . 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 Arthritis   . Drug addict (HCC)    Clean for 20 years  . Psoriatic arthritis (HCC)   . Seasonal allergies     History reviewed. No pertinent family history.  Past Surgical History:  Procedure Laterality Date  . EXCISIONAL TOTAL KNEE ARTHROPLASTY WITH ANTIBIOTIC SPACERS Right 07/07/2012   Procedure: EXCISIONAL TOTAL KNEE ARTHROPLASTY WITH ANTIBIOTIC SPACERS;  Surgeon: 07/09/2012, MD;  Location: [facility] OR;  Service: Orthopedics;  Laterality: Right;  Irrigation and Debridement Right Total Knee Replacement, Insertion of ABX Spacer  . KNEE ARTHROSCOPY Right 07/04/2012   Procedure: ARTHROSCOPY I&D KNEE;  Surgeon: 09/03/2012, MD;  Location: [facility] OR;  Service: Orthopedics;  Laterality: Right;  . KNEE SURGERY    . ORIF TIBIA FRACTURE    . TOTAL HIP ARTHROPLASTY    . TOTAL KNEE ARTHROPLASTY Right 09/12/2012   redo from 2002 sugery  . TOTAL KNEE REVISION Right 09/13/2012   Procedure: TOTAL KNEE REVISION;  Surgeon: 09/15/2012, MD;  Location: [facility] OR;  Service: Orthopedics;  Laterality: Right;  Revision Arthroplasty Right Knee   Social History   Occupational History  . Not on file  Tobacco Use  . Smoking status: Former Smoker    Types: Cigarettes    Quit date: 07/05/1992    Years since quitting: 27.3  . Smokeless tobacco: Never Used  Substance and Sexual Activity  . Alcohol use: No    Comment: recovering addict  .  Drug use: No    Comment: recovering drug addict 20 years clean  . Sexual activity: Not on file

## 2019-11-21 NOTE — Progress Notes (Signed)
 Office Visit Note   Patient: Roy [name]           Date of Birth: 06/23/1955           MRN: [id number] Visit Date: 11/21/2019              Requested by: Wilson, Fred H, MD [id number] [place],  [place] [address] PCP: Wilson, Fred H, MD   Assessment & Plan: Visit Diagnoses:  1. Other chest pain   2. Olecranon fracture, left, closed, initial encounter   3. Leg laceration, left, initial encounter     Plan: Jasier fell through some wooden planks on Saturday i.e. 3 days ago and sustained a left leg laceration and a displaced left olecranon fracture.  He was seen in the emergency room where the wound in the left leg was cleaned and stapled.  He has been on Keflex without any evidence of infection.  He also has a displaced left olecranon fracture that appears to be pathologic and related to a large cystic structure that appears to be benign.  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  I discussed this with Dr. Yates who has time to operate on the fracture Friday.  Dr. Yates spoke with Davidlee. he will biopsy the the cystic structure, debride the cyst and then pack it with bone and apply a long olecranon plate and screws.  Dr. Yates discussed this with Tatum.  I will renew Tylenol 3 for pain and Keflex for prophylaxis for the laceration. Follow-Up Instructions: Return Dr. Yates to operate on olecranon Friday.   Orders:  Orders Placed This Encounter  Procedures  . XR Chest 2 View   Meds ordered this encounter  Medications  . acetaminophen-codeine (TYLENOL #3) 300-30 MG tablet    Sig: Take 1-2 tablets by mouth every 8 (eight) hours as needed for moderate pain.    Dispense:  30 tablet    Refill:  0  . cephALEXin (KEFLEX) 500 MG capsule    Sig: Take 1 capsule (500 mg total) by mouth 4 (four) times daily.    Dispense:  30 capsule    Refill:  0      Procedures: No procedures performed   Clinical Data: No additional  findings.   Subjective: Chief Complaint  Patient presents with  . Left Elbow - Pain, Injury    DOI 11/18/2019  Patient presents today for his left elbow. He fell through a deck on 11/18/2019 and went to the ED at [facility]. He had x-rays and was told he had fractured his proximal ulna. He is taking Tylenol #3 twice daily. He is currently in a splint. He also has a laceration on his left lower leg. They used staples to close the laceration. He is right handed, but has limited use of the right arm due to limited motion in his right elbow. He is currently on antibiotics for the laceration and has been changing the bandage. He has a history of hardware in that leg from an injury in the past He is walking without ambulatory aid and having just minimal discomfort.  He is a recovering addict. I reviewed the films of his elbow on the PACS system.  There was a displaced olecranon fracture over a centimeter.  There is a large cystic mass within the olecranon that is most likely associated with a pathologic fracture.  Appears to be benign.  Are some degenerative changes about the elbow   probably related to his history of psoriatic arthritis  HPI  Review of Systems   Objective: Vital Signs: Ht 5\' 6"  (1.676 m)   Wt 220 lb (99.8 kg)   BMI 35.51 kg/m   Physical Exam Constitutional:      Appearance: He is well-developed.  Eyes:     Pupils: Pupils are equal, round, and reactive to light.  Pulmonary:     Effort: Pulmonary effort is normal.  Skin:    General: Skin is warm and dry.  Neurological:     Mental Status: He is alert and oriented to person, place, and time.  Psychiatric:        Behavior: Behavior normal.     Ortho Exam left leg wound is in the mid lateral leg.  Staples are in place.  No drainage or evidence of active infection.  No fluctuance.  Motor exam intact.  Left upper extremity in a long-arm splint.  Mild swelling of the hand but normal sensory and motor exam. Specialty Comments:   No specialty comments available.  Imaging: XR Chest 2 View  Result Date: 11/21/2019 PA chest film was obtained and compared to chest film from 2014.  No active disease    PMFS History: Patient Active Problem List   Diagnosis Date Noted  . Olecranon fracture, left, closed, initial encounter 11/21/2019  . Leg laceration, left, initial encounter 11/21/2019  . Unilateral primary osteoarthritis, left hip 06/29/2019  . Osteomyelitis of knee region (HCC) 08/03/2012  . 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 Arthritis   . Drug addict (HCC)    Clean for 20 years  . Psoriatic arthritis (HCC)   . Seasonal allergies     History reviewed. No pertinent family history.  Past Surgical History:  Procedure Laterality Date  . EXCISIONAL TOTAL KNEE ARTHROPLASTY WITH ANTIBIOTIC SPACERS Right 07/07/2012   Procedure: EXCISIONAL TOTAL KNEE ARTHROPLASTY WITH ANTIBIOTIC SPACERS;  Surgeon: 07/09/2012, MD;  Location: [facility] OR;  Service: Orthopedics;  Laterality: Right;  Irrigation and Debridement Right Total Knee Replacement, Insertion of ABX Spacer  . KNEE ARTHROSCOPY Right 07/04/2012   Procedure: ARTHROSCOPY I&D KNEE;  Surgeon: 09/03/2012, MD;  Location: [facility] OR;  Service: Orthopedics;  Laterality: Right;  . KNEE SURGERY    . ORIF TIBIA FRACTURE    . TOTAL HIP ARTHROPLASTY    . TOTAL KNEE ARTHROPLASTY Right 09/12/2012   redo from 2002 sugery  . TOTAL KNEE REVISION Right 09/13/2012   Procedure: TOTAL KNEE REVISION;  Surgeon: 09/15/2012, MD;  Location: [facility] OR;  Service: Orthopedics;  Laterality: Right;  Revision Arthroplasty Right Knee   Social History   Occupational History  . Not on file  Tobacco Use  . Smoking status: Former Smoker    Types: Cigarettes    Quit date: 07/05/1992    Years since quitting: 27.3  . Smokeless tobacco: Never Used  Substance and Sexual Activity  . Alcohol use: No    Comment: recovering addict  .  Drug use: No    Comment: recovering drug addict 20 years clean  . Sexual activity: Not on file

## 2019-11-22 ENCOUNTER — Other Ambulatory Visit (HOSPITAL_COMMUNITY)
Admission: RE | Admit: 2019-11-22 | Discharge: 2019-11-22 | Disposition: A | Discharge status: Home / Self Care | Payer: Medicare Other | Source: Ambulatory Visit | Attending: Orthopaedic Surgery | Admitting: Orthopaedic Surgery

## 2019-11-22 ENCOUNTER — Encounter (HOSPITAL_COMMUNITY): Payer: Self-pay | Admitting: Orthopaedic Surgery

## 2019-11-22 DIAGNOSIS — Z20822 Contact with and (suspected) exposure to covid-19: Secondary | ICD-10-CM | POA: Insufficient documentation

## 2019-11-22 LAB — SARS CORONAVIRUS 2 (TAT 6-24 HRS): SARS Coronavirus 2: NEGATIVE

## 2019-11-22 NOTE — Progress Notes (Signed)
Patient was given pre-op instructions over the phone. The opportunity was given for the patient to ask questions. No further questions asked. Patient verbalized understanding of instructions given.   Nothing to eat or drink after midnight.  Patient instructed to STOP taking any Aspirin (unless otherwise instructed by your surgeon), Aleve, Naproxen, Ibuprofen, Motrin, Advil, Goody's, BC's, all herbal medications, fish oil, and all vitamins.   Anesthesia Review Needed: n/a

## 2019-11-23 NOTE — Anesthesia Preprocedure Evaluation (Addendum)
Anesthesia Evaluation  Patient identified by MRN, date of birth, ID band Patient awake    Reviewed: Allergy & Precautions, NPO status , Patient's Chart, lab work & pertinent test results  History of Anesthesia Complications Negative for: history of anesthetic complications  Airway Mallampati: III  TM Distance: >3 FB Neck ROM: Full    Dental  (+) Dental Advisory Given, Teeth Intact   Pulmonary former smoker,    Pulmonary exam normal        Cardiovascular negative cardio ROS Normal cardiovascular exam     Neuro/Psych negative neurological ROS  negative psych ROS   GI/Hepatic negative GI ROS, (+)     substance abuse (>30 yr ago)  ,   Endo/Other   Obesity   Renal/GU negative Renal ROS     Musculoskeletal  (+) Arthritis ,   Abdominal   Peds  Hematology negative hematology ROS (+)   Anesthesia Other Findings Covid test negative   Reproductive/Obstetrics                            Anesthesia Physical Anesthesia Plan  ASA: II  Anesthesia Plan: General   Post-op Pain Management:  Regional for Post-op pain   Induction: Intravenous  PONV Risk Score and Plan: 2 and Treatment may vary due to age or medical condition, Ondansetron, Dexamethasone and Midazolam  Airway Management Planned: LMA  Additional Equipment: None  Intra-op Plan:   Post-operative Plan: Extubation in OR  Informed Consent: I have reviewed the patients History and Physical, chart, labs and discussed the procedure including the risks, benefits and alternatives for the proposed anesthesia with the patient or authorized representative who has indicated his/her understanding and acceptance.     Dental advisory given  Plan Discussed with: CRNA and Anesthesiologist  Anesthesia Plan Comments:        Anesthesia Quick Evaluation

## 2019-11-24 ENCOUNTER — Ambulatory Visit (HOSPITAL_COMMUNITY): Payer: Medicare Other | Admitting: Anesthesiology

## 2019-11-24 ENCOUNTER — Observation Stay (HOSPITAL_COMMUNITY)
Admission: RE | Admit: 2019-11-24 | Discharge: 2019-11-25 | Disposition: A | Discharge status: Home / Self Care | Payer: Medicare Other | Attending: Orthopaedic Surgery | Admitting: Orthopaedic Surgery

## 2019-11-24 ENCOUNTER — Ambulatory Visit (HOSPITAL_COMMUNITY): Payer: Medicare Other

## 2019-11-24 ENCOUNTER — Encounter (HOSPITAL_COMMUNITY): Payer: Self-pay | Admitting: Orthopaedic Surgery

## 2019-11-24 ENCOUNTER — Encounter (HOSPITAL_COMMUNITY)
Admission: RE | Disposition: A | Discharge status: Home / Self Care | Payer: Self-pay | Source: Home / Self Care | Attending: Orthopaedic Surgery

## 2019-11-24 ENCOUNTER — Other Ambulatory Visit: Payer: Self-pay

## 2019-11-24 DIAGNOSIS — Z96651 Presence of right artificial knee joint: Secondary | ICD-10-CM | POA: Diagnosis not present

## 2019-11-24 DIAGNOSIS — Z87891 Personal history of nicotine dependence: Secondary | ICD-10-CM | POA: Insufficient documentation

## 2019-11-24 DIAGNOSIS — L4052 Psoriatic arthritis mutilans: Secondary | ICD-10-CM | POA: Diagnosis not present

## 2019-11-24 DIAGNOSIS — Z01818 Encounter for other preprocedural examination: Secondary | ICD-10-CM

## 2019-11-24 DIAGNOSIS — Z419 Encounter for procedure for purposes other than remedying health state, unspecified: Secondary | ICD-10-CM

## 2019-11-24 DIAGNOSIS — Z96641 Presence of right artificial hip joint: Secondary | ICD-10-CM | POA: Diagnosis not present

## 2019-11-24 DIAGNOSIS — R0789 Other chest pain: Secondary | ICD-10-CM | POA: Insufficient documentation

## 2019-11-24 DIAGNOSIS — S42402A Unspecified fracture of lower end of left humerus, initial encounter for closed fracture: Secondary | ICD-10-CM | POA: Diagnosis not present

## 2019-11-24 DIAGNOSIS — M84432A Pathological fracture, left ulna, initial encounter for fracture: Principal | ICD-10-CM | POA: Insufficient documentation

## 2019-11-24 DIAGNOSIS — W1789XA Other fall from one level to another, initial encounter: Secondary | ICD-10-CM | POA: Insufficient documentation

## 2019-11-24 DIAGNOSIS — S59902A Unspecified injury of left elbow, initial encounter: Secondary | ICD-10-CM | POA: Diagnosis present

## 2019-11-24 DIAGNOSIS — S81812D Laceration without foreign body, left lower leg, subsequent encounter: Secondary | ICD-10-CM | POA: Insufficient documentation

## 2019-11-24 HISTORY — PX: ORIF ELBOW FRACTURE: SHX5031

## 2019-11-24 LAB — COMPREHENSIVE METABOLIC PANEL
ALT: 39 U/L (ref 0–44)
AST: 29 U/L (ref 15–41)
Albumin: 3.4 g/dL — ABNORMAL LOW (ref 3.5–5.0)
Alkaline Phosphatase: 59 U/L (ref 38–126)
Anion gap: 10 (ref 5–15)
BUN: 14 mg/dL (ref 8–23)
CO2: 23 mmol/L (ref 22–32)
Calcium: 8.9 mg/dL (ref 8.9–10.3)
Chloride: 101 mmol/L (ref 98–111)
Creatinine, Ser: 0.66 mg/dL (ref 0.61–1.24)
GFR calc Af Amer: >60 mL/min (ref >60)
GFR calc non Af Amer: >60 mL/min (ref >60)
Glucose, Bld: 107 mg/dL — ABNORMAL HIGH (ref 70–99)
Potassium: 3.9 mmol/L (ref 3.5–5.1)
Sodium: 134 mmol/L — ABNORMAL LOW (ref 135–145)
Total Bilirubin: 0.9 mg/dL (ref 0.3–1.2)
Total Protein: 6.5 g/dL (ref 6.5–8.1)

## 2019-11-24 LAB — SURGICAL PCR SCREEN
MRSA, PCR: NEGATIVE
Staphylococcus aureus: POSITIVE — AB

## 2019-11-24 LAB — CBC
HCT: 47.6 % (ref 39.0–52.0)
Hemoglobin: 16 g/dL (ref 13.0–17.0)
MCH: 29.1 pg (ref 26.0–34.0)
MCHC: 33.6 g/dL (ref 30.0–36.0)
MCV: 86.5 fL (ref 80.0–100.0)
Platelets: 265 10*3/uL (ref 150–400)
RBC: 5.5 MIL/uL (ref 4.22–5.81)
RDW: 12.5 % (ref 11.5–15.5)
WBC: 7.8 10*3/uL (ref 4.0–10.5)
nRBC: 0 % (ref 0.0–0.2)

## 2019-11-24 LAB — URINALYSIS, ROUTINE W REFLEX MICROSCOPIC
Bilirubin Urine: NEGATIVE
Glucose, UA: NEGATIVE mg/dL
Hgb urine dipstick: NEGATIVE
Ketones, ur: NEGATIVE mg/dL
Leukocytes,Ua: NEGATIVE
Nitrite: NEGATIVE
Protein, ur: NEGATIVE mg/dL
Specific Gravity, Urine: 1.02 (ref 1.005–1.030)
pH: 5 (ref 5.0–8.0)

## 2019-11-24 SURGERY — OPEN REDUCTION INTERNAL FIXATION (ORIF) ELBOW/OLECRANON FRACTURE
Anesthesia: General | Site: Elbow | Laterality: Left

## 2019-11-24 MED ORDER — 0.9 % SODIUM CHLORIDE (POUR BTL) OPTIME
TOPICAL | Status: DC | PRN
  Administered 2019-11-24: 1000 mL

## 2019-11-24 MED ORDER — METOCLOPRAMIDE HCL 5 MG PO TABS: 5.0000 mg | ORAL_TABLET | Freq: Three times a day (TID) | ORAL | Status: DC | PRN

## 2019-11-24 MED ORDER — MIDAZOLAM HCL 2 MG/2ML IJ SOLN
INTRAMUSCULAR | Status: DC | PRN
  Administered 2019-11-24: 2 mg via INTRAVENOUS

## 2019-11-24 MED ORDER — DOCUSATE SODIUM 100 MG PO CAPS
100.0000 mg | ORAL_CAPSULE | Freq: Two times a day (BID) | ORAL | Status: DC
  Administered 2019-11-24: 100 mg via ORAL
  Filled 2019-11-24: qty 1

## 2019-11-24 MED ORDER — CHLORHEXIDINE GLUCONATE 0.12 % MT SOLN
OROMUCOSAL | Status: AC
  Administered 2019-11-24: 15 mL via OROMUCOSAL
  Filled 2019-11-24: qty 15

## 2019-11-24 MED ORDER — PHENYLEPHRINE 40 MCG/ML (10ML) SYRINGE FOR IV PUSH (FOR BLOOD PRESSURE SUPPORT)
PREFILLED_SYRINGE | INTRAVENOUS | Status: AC
  Filled 2019-11-24: qty 10

## 2019-11-24 MED ORDER — BUPIVACAINE HCL (PF) 0.25 % IJ SOLN: INTRAMUSCULAR | Status: DC | PRN

## 2019-11-24 MED ORDER — ROCURONIUM BROMIDE 10 MG/ML (PF) SYRINGE
PREFILLED_SYRINGE | INTRAVENOUS | Status: DC | PRN
  Administered 2019-11-24: 60 mg via INTRAVENOUS
  Administered 2019-11-24: 20 mg via INTRAVENOUS

## 2019-11-24 MED ORDER — DEXAMETHASONE SODIUM PHOSPHATE 10 MG/ML IJ SOLN
INTRAMUSCULAR | Status: DC | PRN
  Administered 2019-11-24: 5 mg via INTRAVENOUS

## 2019-11-24 MED ORDER — CHLORHEXIDINE GLUCONATE 0.12 % MT SOLN: 15.0000 mL | Freq: Once | OROMUCOSAL | Status: AC

## 2019-11-24 MED ORDER — DEXAMETHASONE SODIUM PHOSPHATE 10 MG/ML IJ SOLN
INTRAMUSCULAR | Status: AC
  Filled 2019-11-24: qty 1

## 2019-11-24 MED ORDER — CEFAZOLIN SODIUM-DEXTROSE 2-4 GM/100ML-% IV SOLN
INTRAVENOUS | Status: AC
  Filled 2019-11-24: qty 100

## 2019-11-24 MED ORDER — ONDANSETRON HCL 4 MG/2ML IJ SOLN
INTRAMUSCULAR | Status: DC | PRN
  Administered 2019-11-24: 4 mg via INTRAVENOUS

## 2019-11-24 MED ORDER — PHENYLEPHRINE 40 MCG/ML (10ML) SYRINGE FOR IV PUSH (FOR BLOOD PRESSURE SUPPORT)
PREFILLED_SYRINGE | INTRAVENOUS | Status: DC | PRN
  Administered 2019-11-24: 120 ug via INTRAVENOUS

## 2019-11-24 MED ORDER — ACETAMINOPHEN 325 MG PO TABS: 325.0000 mg | ORAL_TABLET | Freq: Four times a day (QID) | ORAL | Status: DC | PRN

## 2019-11-24 MED ORDER — SODIUM CHLORIDE 0.9 % IV SOLN: INTRAVENOUS | Status: DC

## 2019-11-24 MED ORDER — METOCLOPRAMIDE HCL 5 MG/ML IJ SOLN
5.0000 mg | Freq: Three times a day (TID) | INTRAMUSCULAR | Status: DC | PRN
  Administered 2019-11-25: 10 mg via INTRAVENOUS
  Filled 2019-11-24: qty 2

## 2019-11-24 MED ORDER — OXYCODONE-ACETAMINOPHEN 5-325 MG PO TABS: 1.0000 | ORAL_TABLET | Freq: Four times a day (QID) | ORAL | 0 refills | Status: DC | PRN

## 2019-11-24 MED ORDER — LIDOCAINE 2% (20 MG/ML) 5 ML SYRINGE
INTRAMUSCULAR | Status: DC | PRN
  Administered 2019-11-24: 40 mg via INTRAVENOUS

## 2019-11-24 MED ORDER — BUPIVACAINE HCL (PF) 0.25 % IJ SOLN
INTRAMUSCULAR | Status: AC
  Filled 2019-11-24: qty 30

## 2019-11-24 MED ORDER — FENTANYL CITRATE (PF) 250 MCG/5ML IJ SOLN
INTRAMUSCULAR | Status: DC | PRN
Start: 2019-11-24 — End: 2019-11-24
  Administered 2019-11-24: 75 ug via INTRAVENOUS
  Administered 2019-11-24: 25 ug via INTRAVENOUS

## 2019-11-24 MED ORDER — PROPOFOL 10 MG/ML IV BOLUS
INTRAVENOUS | Status: DC | PRN
  Administered 2019-11-24: 160 mg via INTRAVENOUS

## 2019-11-24 MED ORDER — ONDANSETRON HCL 4 MG/2ML IJ SOLN
INTRAMUSCULAR | Status: AC
  Filled 2019-11-24: qty 2

## 2019-11-24 MED ORDER — CEFAZOLIN SODIUM-DEXTROSE 2-4 GM/100ML-% IV SOLN
2.0000 g | INTRAVENOUS | Status: AC
  Administered 2019-11-24: 2 g via INTRAVENOUS

## 2019-11-24 MED ORDER — FLUTICASONE PROPIONATE 50 MCG/ACT NA SUSP: 2.0000 | Freq: Every day | NASAL | Status: DC | PRN

## 2019-11-24 MED ORDER — OXYCODONE HCL 5 MG PO TABS
5.0000 mg | ORAL_TABLET | ORAL | Status: DC | PRN
  Administered 2019-11-24: 5 mg via ORAL
  Filled 2019-11-24: qty 1

## 2019-11-24 MED ORDER — LIDOCAINE 2% (20 MG/ML) 5 ML SYRINGE
INTRAMUSCULAR | Status: AC
  Filled 2019-11-24: qty 5

## 2019-11-24 MED ORDER — ORAL CARE MOUTH RINSE: 15.0000 mL | Freq: Once | OROMUCOSAL | Status: AC

## 2019-11-24 MED ORDER — HYDROMORPHONE HCL 1 MG/ML IJ SOLN: 0.5000 mg | INTRAMUSCULAR | Status: DC | PRN

## 2019-11-24 MED ORDER — BUPIVACAINE-EPINEPHRINE (PF) 0.5% -1:200000 IJ SOLN
INTRAMUSCULAR | Status: DC | PRN
  Administered 2019-11-24: 30 mL via PERINEURAL

## 2019-11-24 MED ORDER — ONDANSETRON HCL 4 MG/2ML IJ SOLN
4.0000 mg | Freq: Four times a day (QID) | INTRAMUSCULAR | Status: DC | PRN
  Administered 2019-11-25: 4 mg via INTRAVENOUS
  Filled 2019-11-24: qty 2

## 2019-11-24 MED ORDER — PROPOFOL 10 MG/ML IV BOLUS
INTRAVENOUS | Status: AC
  Filled 2019-11-24: qty 20

## 2019-11-24 MED ORDER — ONDANSETRON HCL 4 MG/2ML IJ SOLN: 4.0000 mg | Freq: Once | INTRAMUSCULAR | Status: DC | PRN

## 2019-11-24 MED ORDER — ONDANSETRON HCL 4 MG PO TABS: 4.0000 mg | ORAL_TABLET | Freq: Four times a day (QID) | ORAL | Status: DC | PRN

## 2019-11-24 MED ORDER — CEFAZOLIN SODIUM-DEXTROSE 1-4 GM/50ML-% IV SOLN
1.0000 g | Freq: Three times a day (TID) | INTRAVENOUS | Status: AC
  Administered 2019-11-24 – 2019-11-25 (×3): 1 g via INTRAVENOUS
  Filled 2019-11-24 (×3): qty 50

## 2019-11-24 MED ORDER — LACTATED RINGERS IV SOLN: INTRAVENOUS | Status: DC

## 2019-11-24 MED ORDER — SUGAMMADEX SODIUM 200 MG/2ML IV SOLN
INTRAVENOUS | Status: DC | PRN
  Administered 2019-11-24: 200 mg via INTRAVENOUS

## 2019-11-24 MED ORDER — MIDAZOLAM HCL 2 MG/2ML IJ SOLN
INTRAMUSCULAR | Status: AC
  Filled 2019-11-24: qty 2

## 2019-11-24 MED ORDER — ROCURONIUM BROMIDE 10 MG/ML (PF) SYRINGE
PREFILLED_SYRINGE | INTRAVENOUS | Status: AC
  Filled 2019-11-24: qty 10

## 2019-11-24 MED ORDER — POLYETHYLENE GLYCOL 3350 17 G PO PACK: 17.0000 g | PACK | Freq: Every day | ORAL | Status: DC | PRN

## 2019-11-24 MED ORDER — FENTANYL CITRATE (PF) 250 MCG/5ML IJ SOLN
INTRAMUSCULAR | Status: AC
  Filled 2019-11-24: qty 5

## 2019-11-24 MED ORDER — FENTANYL CITRATE (PF) 100 MCG/2ML IJ SOLN: 25.0000 ug | INTRAMUSCULAR | Status: DC | PRN

## 2019-11-24 SURGICAL SUPPLY — 84 items
BENZOIN TINCTURE PRP APPL 2/3 (GAUZE/BANDAGES/DRESSINGS) IMPLANT
BIT DRILL 2.5X2.75 QC CALB (BIT) ×2 IMPLANT
BIT DRILL CALIBRATED 2.7 (BIT) ×2 IMPLANT
BLADE AVERAGE 25X9 (BLADE) ×2 IMPLANT
BLADE CLIPPER SURG (BLADE) ×2 IMPLANT
BLADE SURG 10 STRL SS (BLADE) IMPLANT
BNDG COHESIVE 3X5 TAN STRL LF (GAUZE/BANDAGES/DRESSINGS) ×2 IMPLANT
BNDG COHESIVE 4X5 TAN STRL (GAUZE/BANDAGES/DRESSINGS) ×2 IMPLANT
BNDG ELASTIC 3X5.8 VLCR STR LF (GAUZE/BANDAGES/DRESSINGS) ×2 IMPLANT
BNDG ELASTIC 4X5.8 VLCR STR LF (GAUZE/BANDAGES/DRESSINGS) ×4 IMPLANT
BNDG ELASTIC 6X5.8 VLCR STR LF (GAUZE/BANDAGES/DRESSINGS) ×2 IMPLANT
BNDG ESMARK 4X9 LF (GAUZE/BANDAGES/DRESSINGS) ×2 IMPLANT
BNDG GAUZE ELAST 4 BULKY (GAUZE/BANDAGES/DRESSINGS) ×2 IMPLANT
BONE CANC CHIPS 20CC PCAN1/4 (Bone Implant) ×2 IMPLANT
Bupivacaine 0.25% 30 mL IMPLANT
CHIPS CANC BONE 20CC PCAN1/4 (Bone Implant) ×1 IMPLANT
CLEANER TIP ELECTROSURG 2X2 (MISCELLANEOUS) ×2 IMPLANT
COVER SURGICAL LIGHT HANDLE (MISCELLANEOUS) ×2 IMPLANT
COVER WAND RF STERILE (DRAPES) ×2 IMPLANT
CUFF TOURN SGL QUICK 18X4 (TOURNIQUET CUFF) ×2 IMPLANT
CUFF TOURN SGL QUICK 24 (TOURNIQUET CUFF) ×2
CUFF TRNQT CYL 24X4X16.5-23 (TOURNIQUET CUFF) ×1 IMPLANT
DECANTER SPIKE VIAL GLASS SM (MISCELLANEOUS) IMPLANT
DRAPE C-ARM 42X72 X-RAY (DRAPES) ×2 IMPLANT
DRAPE INCISE IOBAN 66X45 STRL (DRAPES) ×2 IMPLANT
DRAPE U-SHAPE 47X51 STRL (DRAPES) ×2 IMPLANT
ELECT REM PT RETURN 9FT ADLT (ELECTROSURGICAL) ×2
ELECTRODE REM PT RTRN 9FT ADLT (ELECTROSURGICAL) ×1 IMPLANT
GAUZE SPONGE 4X4 12PLY STRL (GAUZE/BANDAGES/DRESSINGS) ×4 IMPLANT
GAUZE XEROFORM 1X8 LF (GAUZE/BANDAGES/DRESSINGS) ×2 IMPLANT
GAUZE XEROFORM 5X9 LF (GAUZE/BANDAGES/DRESSINGS) ×2 IMPLANT
GLOVE BIOGEL PI IND STRL 8 (GLOVE) ×1 IMPLANT
GLOVE BIOGEL PI INDICATOR 8 (GLOVE) ×1
GLOVE ORTHO TXT STRL SZ7.5 (GLOVE) ×8 IMPLANT
GOWN STRL REUS W/ TWL LRG LVL3 (GOWN DISPOSABLE) ×1 IMPLANT
GOWN STRL REUS W/ TWL XL LVL3 (GOWN DISPOSABLE) ×1 IMPLANT
GOWN STRL REUS W/TWL 2XL LVL3 (GOWN DISPOSABLE) ×2 IMPLANT
GOWN STRL REUS W/TWL LRG LVL3 (GOWN DISPOSABLE) ×2
GOWN STRL REUS W/TWL XL LVL3 (GOWN DISPOSABLE) ×1
K-WIRE ACE 1.6X6 (WIRE) ×6
KIT BASIN OR (CUSTOM PROCEDURE TRAY) ×2 IMPLANT
KIT TURNOVER KIT B (KITS) ×2 IMPLANT
KWIRE ACE 1.6X6 (WIRE) ×3 IMPLANT
MANIFOLD NEPTUNE II (INSTRUMENTS) ×2 IMPLANT
NDL SUT 2 .5 CRC MAYO 1.732X (NEEDLE) ×1 IMPLANT
NEEDLE MAYO TAPER (NEEDLE) ×1
NS IRRIG 1000ML POUR BTL (IV SOLUTION) ×2 IMPLANT
PACK ORTHO EXTREMITY (CUSTOM PROCEDURE TRAY) ×2 IMPLANT
PAD ABD 8X10 STRL (GAUZE/BANDAGES/DRESSINGS) ×4 IMPLANT
PAD ARMBOARD 7.5X6 YLW CONV (MISCELLANEOUS) ×4 IMPLANT
PAD CAST 4YDX4 CTTN HI CHSV (CAST SUPPLIES) ×3 IMPLANT
PADDING CAST COTTON 4X4 STRL (CAST SUPPLIES) ×6
PLATE OLECRANON LRG (Plate) ×2 IMPLANT
PUTTY DBM STAGRAFT PLUS 5CC (Putty) ×2 IMPLANT
SCREW CORT T15 28X3.5XST LCK (Screw) ×1 IMPLANT
SCREW CORT T15 TPR 55X3.5XST (Screw) ×1 IMPLANT
SCREW CORTICAL 3.5MM  20MM (Screw) ×2 IMPLANT
SCREW CORTICAL 3.5MM 20MM (Screw) ×1 IMPLANT
SCREW CORTICAL 3.5X28MM (Screw) ×2 IMPLANT
SCREW CORTICAL 3.5X55MM (Screw) ×2 IMPLANT
SCREW CORTICAL LOW PROF 3.5X20 (Screw) ×4 IMPLANT
SCREW LOCK CORT STAR 3.5X18 (Screw) ×4 IMPLANT
SCREW LOCK CORT STAR 3.5X22 (Screw) ×4 IMPLANT
SCREW LOW PROFILE 18MMX3.5MM (Screw) ×4 IMPLANT
SCREW LOW PROFILE 22MMX3.5MM (Screw) ×4 IMPLANT
SLING ARM FOAM STRAP XLG (SOFTGOODS) ×2 IMPLANT
SPONGE LAP 18X18 RF (DISPOSABLE) ×4 IMPLANT
STAPLER VISISTAT 35W (STAPLE) ×2 IMPLANT
STOCKINETTE IMPERVIOUS 9X36 MD (GAUZE/BANDAGES/DRESSINGS) ×2 IMPLANT
STRIP CLOSURE SKIN 1/2X4 (GAUZE/BANDAGES/DRESSINGS) IMPLANT
SUCTION FRAZIER HANDLE 10FR (MISCELLANEOUS) ×2
SUCTION TUBE FRAZIER 10FR DISP (MISCELLANEOUS) ×1 IMPLANT
SUT PROLENE 3 0 PS 2 (SUTURE) ×4 IMPLANT
SUT VIC AB 0 CT1 27 (SUTURE) ×4
SUT VIC AB 0 CT1 27XBRD ANBCTR (SUTURE) ×2 IMPLANT
SUT VIC AB 2-0 CT1 27 (SUTURE) ×4
SUT VIC AB 2-0 CT1 TAPERPNT 27 (SUTURE) ×2 IMPLANT
SYR CONTROL 10ML LL (SYRINGE) ×2 IMPLANT
TOWEL GREEN STERILE (TOWEL DISPOSABLE) ×2 IMPLANT
TOWEL GREEN STERILE FF (TOWEL DISPOSABLE) IMPLANT
TUBE CONNECTING 12X1/4 (SUCTIONS) ×2 IMPLANT
UNDERPAD 30X36 HEAVY ABSORB (UNDERPADS AND DIAPERS) ×2 IMPLANT
WATER STERILE IRR 1000ML POUR (IV SOLUTION) ×2 IMPLANT
YANKAUER SUCT BULB TIP NO VENT (SUCTIONS) ×2 IMPLANT

## 2019-11-24 NOTE — Transfer of Care (Signed)
Immediate Anesthesia Transfer of Care Note  Patient: Roy [name]  Procedure(s) Performed: BIOPSY, OPEN REDUCTION INTERNAL FIXATION (ORIF) LEFT ELBOW/OLECRANON FRACTURE (Left Elbow)  Patient Location: PACU  Anesthesia Type:General and Regional  Level of Consciousness: awake and oriented  Airway & Oxygen Therapy: Patient Spontanous Breathing and Patient connected to nasal cannula oxygen  Post-op Assessment: Report given to RN  Post vital signs: Reviewed and stable  Last Vitals:  Vitals Value Taken Time  BP    Temp    Pulse 92 11/24/19 0952  Resp 28 11/24/19 0952  SpO2 95 % 11/24/19 0952  Vitals shown include unvalidated device data.  Last Pain:  Vitals:   11/24/19 4888  TempSrc:   PainSc: 0-No pain         Complications: No complications documented.

## 2019-11-24 NOTE — Anesthesia Procedure Notes (Signed)
Procedure Name: Intubation Date/Time: 11/24/2019 7:39 AM Performed by: Barrington Ellison, CRNA Pre-anesthesia Checklist: Patient identified, Emergency Drugs available, Suction available and Patient being monitored Patient Re-evaluated:Patient Re-evaluated prior to induction Oxygen Delivery Method: Circle System Utilized Preoxygenation: Pre-oxygenation with 100% oxygen Induction Type: IV induction Ventilation: Mask ventilation without difficulty Laryngoscope Size: Mac and 4 Grade View: Grade I Tube type: Oral Tube size: 7.5 mm Number of attempts: 1 Airway Equipment and Method: Stylet and Oral airway Placement Confirmation: ETT inserted through vocal cords under direct vision,  positive ETCO2 and breath sounds checked- equal and bilateral Secured at: 22 cm Tube secured with: Tape Dental Injury: Teeth and Oropharynx as per pre-operative assessment

## 2019-11-24 NOTE — Anesthesia Postprocedure Evaluation (Signed)
Anesthesia Post Note  Patient: Sandra Cockayne  Procedure(s) Performed: BIOPSY, OPEN REDUCTION INTERNAL FIXATION (ORIF) LEFT ELBOW/OLECRANON FRACTURE (Left Elbow)     Patient location during evaluation: PACU Anesthesia Type: General Level of consciousness: awake and alert Pain management: pain level controlled Vital Signs Assessment: post-procedure vital signs reviewed and stable Respiratory status: spontaneous breathing, nonlabored ventilation and respiratory function stable Cardiovascular status: blood pressure returned to baseline and stable Postop Assessment: no apparent nausea or vomiting Anesthetic complications: no   No complications documented.  Last Vitals:  Vitals:   11/24/19 1051 11/24/19 1122  BP: (!) 155/81 129/90  Pulse: 76 79  Resp: 20 18  Temp: 36.4 C 36.7 C  SpO2: 95% 94%    Last Pain:  Vitals:   11/24/19 1122  TempSrc: Oral  PainSc:                  Beryle Lathe

## 2019-11-24 NOTE — Interval H&P Note (Signed)
History and Physical Interval Note:  11/24/2019 7:24 AM  Roy [name]  has presented today for surgery, with the diagnosis of left olecranon pathologic fracture.  The various methods of treatment have been discussed with the patient and family. After consideration of risks, benefits and other options for treatment, the patient has consented to  Procedure(s): BIOPSY, OPEN REDUCTION INTERNAL FIXATION (ORIF) LEFT ELBOW/OLECRANON FRACTURE (Left) as a surgical intervention.  The patient's history has been reviewed, patient examined, no change in status, stable for surgery.  I have reviewed the patient's chart and labs.  Questions were answered to the patient's satisfaction.     Eldred Manges

## 2019-11-24 NOTE — Anesthesia Procedure Notes (Signed)
Anesthesia Regional Block: Supraclavicular block   Pre-Anesthetic Checklist: ,, timeout performed, Correct Patient, Correct Site, Correct Laterality, Correct Procedure, Correct Position, site marked, Risks and benefits discussed,  Surgical consent,  Pre-op evaluation,  At surgeon's request and post-op pain management  Laterality: Left  Prep: chloraprep       Needles:  Injection technique: Single-shot  Needle Type: Echogenic Needle     Needle Length: 5cm  Needle Gauge: 21     Additional Needles:   Narrative:  Start time: 11/24/2019 7:12 AM End time: 11/24/2019 7:16 AM Injection made incrementally with aspirations every 5 mL.  Performed by: Personally  Anesthesiologist: Beryle Lathe, MD  Additional Notes: No pain on injection. No increased resistance to injection. Injection made in 5cc increments. Good needle visualization. Patient tolerated the procedure well.

## 2019-11-24 NOTE — Evaluation (Signed)
Occupational Therapy Evaluation Patient Details Name: Roy [name] MRN: [id number] DOB: 03/29/1955 Today's Date: 11/24/2019    History of Present Illness Pt is a 64 year old man who fell through his deck on 9/25 resulting in a L LE laceration and displaced L olecranon fx. Admitted 11/24/19 for ORIF of elbow. PMH: psoriatic arthritis with severely limited ROM of R elbow and B knees, R THA, R TKA, ORIF tibia.    Clinical Impression   Pt was assisted to stand using his lift chair at maximum height and walked without a device. He used adaptive equipment for self feeding and was assisted by his wife for bathing, dressing and toileting. Nerve block remains intact in L UE, splint adjusted and ice applied at end of session. Pt currently requires up to min assist with second person for safety for OOB. Pt sleeps in his recliner. Pt and wife were managing at home with minimal difficulty prior to admission and since his fall. Pt anticipates returning home with wife. Will follow acutely.    Follow Up Recommendations  No OT follow up    Equipment Recommendations  None recommended by OT    Recommendations for Other Services       Precautions / Restrictions Precautions Precautions: Fall Required Braces or Orthoses: Sling Restrictions Weight Bearing Restrictions:  (no specific WB orders, but adhered to NWB on L UE)      Mobility Bed Mobility Overal bed mobility: Needs Assistance Bed Mobility: Supine to Sit     Supine to sit: +2 for physical assistance;Mod assist     General bed mobility comments: HOB up, assist for LEs over EOB and to raise trunk, pt able to move "walk" hips out to EOB  Transfers Overall transfer level: Needs assistance Equipment used: 2 person hand held assist Transfers: Sit to/from Stand Sit to Stand: Min assist;+2 safety/equipment         General transfer comment: Bed raised substantially to mimic lift recliner at home, pt uses momentum to stand, assist to rise  and steady    Balance Overall balance assessment: Needs assistance   Sitting balance-Leahy Scale: Fair       Standing balance-Leahy Scale: Fair                             ADL either performed or assessed with clinical judgement   ADL Overall ADL's : Needs assistance/impaired Eating/Feeding: Minimal assistance;Bed level Eating/Feeding Details (indicate cue type and reason): wife to bring in pt's adaptive eating utensils Grooming: Total assistance;Sitting   Upper Body Bathing: Sitting;Total assistance   Lower Body Bathing: Total assistance;+2 for safety/equipment;Sit to/from stand   Upper Body Dressing : Total assistance;Sitting Upper Body Dressing Details (indicate cue type and reason): educated in compensatory strategies Lower Body Dressing: Total assistance;+2 for safety/equipment;Sit to/from stand   Toilet Transfer: +2 for safety/equipment;Ambulation;Minimal assistance   Toileting- Clothing Manipulation and Hygiene: Total assistance;+2 for safety/equipment;Sit to/from stand       Functional mobility during ADLs: Minimal assistance;+2 for safety/equipment       Vision Baseline Vision/History: Wears glasses Wears Glasses: At all times Patient Visual Report: No change from baseline       Perception     Praxis      Pertinent Vitals/Pain Pain Assessment: Faces Faces Pain Scale: Hurts little more Pain Location: IV site Pain Descriptors / Indicators: Grimacing Pain Intervention(s): Repositioned     Hand Dominance Right   Extremity/Trunk Assessment Upper Extremity  Assessment Upper Extremity Assessment: LUE deficits/detail;RUE deficits/detail RUE Deficits / Details: longstanding elbow ROM limitations due to psoriatic arthritis RUE Coordination: decreased gross motor LUE Deficits / Details: splinted from DIPs to just distal of shoulder LUE: Unable to fully assess due to immobilization LUE Coordination: decreased fine motor;decreased gross motor    Lower Extremity Assessment Lower Extremity Assessment: Defer to PT evaluation   Cervical / Trunk Assessment Cervical / Trunk Assessment: Other exceptions Cervical / Trunk Exceptions: obesity   Communication Communication Communication: No difficulties   Cognition Arousal/Alertness: Awake/alert Behavior During Therapy: WFL for tasks assessed/performed Overall Cognitive Status: Within Functional Limits for tasks assessed                                     General Comments       Exercises     Shoulder Instructions      Home Living Family/patient expects to be discharged to:: Private residence Living Arrangements: Spouse/significant other Available Help at Discharge: Family;Available 24 hours/day Type of Home: House Home Access: Ramped entrance     Home Layout: One level     Bathroom Shower/Tub: Producer, television/film/video: Standard     Home Equipment: Toilet riser;Grab bars - toilet;Grab bars - tub/shower;Walker - 2 wheels;Adaptive equipment;Other (comment) (lift chair) Adaptive Equipment: Feeding equipment        Prior Functioning/Environment Level of Independence: Needs assistance  Gait / Transfers Assistance Needed: walks without a device, but requires maximum elevation of lift chair to stand with wife's using gait belt under his arms ADL's / Homemaking Assistance Needed: self feeds with adaptive equipment, assisted for bathing, dressing and toileting            OT Problem List: Decreased range of motion;Impaired balance (sitting and/or standing);Decreased knowledge of use of DME or AE;Obesity;Impaired UE functional use;Pain      OT Treatment/Interventions: Self-care/ADL training;DME and/or AE instruction;Patient/family education;Balance training;Therapeutic activities    OT Goals(Current goals can be found in the care plan section) Acute Rehab OT Goals Patient Stated Goal: return home to his dogs OT Goal Formulation: With  patient Potential to Achieve Goals: Good ADL Goals Pt Will Transfer to Toilet: with min guard assist;ambulating;bedside commode  OT Frequency: Min 2X/week   Barriers to D/C:            Co-evaluation PT/OT/SLP Co-Evaluation/Treatment: Yes Reason for Co-Treatment: For patient/therapist safety   OT goals addressed during session: ADL's and self-care      AM-PAC OT "6 Clicks" Daily Activity     Outcome Measure Help from another person eating meals?: A Lot Help from another person taking care of personal grooming?: Total Help from another person toileting, which includes using toliet, bedpan, or urinal?: Total Help from another person bathing (including washing, rinsing, drying)?: Total Help from another person to put on and taking off regular upper body clothing?: A Lot Help from another person to put on and taking off regular lower body clothing?: Total 6 Click Score: 8   End of Session Equipment Utilized During Treatment: Gait belt Nurse Communication: Mobility status (avoid chair, use BSC at max height, adapt straw and IS on ta)  Activity Tolerance: Patient tolerated treatment well Patient left: in bed;with call bell/phone within reach  OT Visit Diagnosis: Other abnormalities of gait and mobility (R26.89);Pain;Muscle weakness (generalized) (M62.81)                Time:  5797-2820 OT Time Calculation (min): 48 min Charges:  OT General Charges $OT Visit: 1 Visit OT Evaluation $OT Eval Moderate Complexity: 1 Mod  Martie Round, OTR/L Acute Rehabilitation Services Pager: [phone] Office: [phone] Evern Bio 11/24/2019, 3:57 PM

## 2019-11-24 NOTE — Op Note (Signed)
Preop diagnosis: Fall with pathologic fracture through left olecranon, closed and displaced.  Postop diagnosis: Same  Procedure: Excision of proximal ulna intramedullary metaphyseal lesion with curettage and allograft bone grafting.  Open reduction internal fixation compression plate fixation of olecranon fracture.  Surgeon: Annell Greening, MD  Assistant: Zonia Kief, PA-C, medically necessary and present for the entire procedure  Anesthesia: Preoperative block plus general anesthesia.  Brief history: 64 year old male with severe psoriatic arthritis destructive type previous total knee arthroplasty with postop total knee infection requiring revision surgery.  No problems with his left elbow although he has severe cont consistent with a long racture of his right elbow after a previous right elbow surgery years ago.  Patient slipped and fell suffering large leg laceration closed in the emergency room as well as olecranon fracture with x-rays revealing a metaphyseal lesion greater than 5 cm intramedullary without cortical disruption but with a displaced olecranon fracture more than 2 cm displaced.  He was brought to the operating room for excision of the metaphyseal defect curettage and bone grafting as well as fixation of the fracture so he can resume elbow function since he cannot reach his mouth or face with his opposite right elbow.  Procedure: After preoperative block Ancef prophylaxis with positive PCR for MSSA but negative for MRSA patient was intubated placed in lateral position marked to frame axillary roll careful padding positioning of his opposite down right arm which had 45 degree elbow contracture.  1015 drape was applied DuraPrep from the wrist to the shoulder.  Proximal arm tourniquet of been applied and usual extremity sheets drapes impervious stockinette Coban and extremity sheets were applied.  Timeout procedure was completed.  Sterile skin marker was used followed by Betadine Steri-Drape  sealing the skin.  Arm is wrapped in Esmarch tourniquet inflated.  Midline incision was made through severe psoriatic arthritis type skin over the elbow.  It was biased slightly dorsally staying away from the ulnar nerve and to slightly better skin biopsy 5 to 8 mm over.  Fracture hematoma was removed and then using curettes [place] pickups bare rondure lesion was curetted.  There was minimal soft tissue and the lesion was primarily cystic gelatinous and yellow.  There was no extra medullary component of the lesion was all intramedullary and edges of the lesion were sclerotic bone consistent with a longstanding benign type lesion.  Was meticulously curetted proximally distally.  I called the pathologist and discussed with her in detail and had a review radiographs of the pathologic fracture.  After a thorough curettage a mixture of cancellous chips 20 cc and bone putty was mixed together after rehydration of the allograft into a slurry and meticulously packed and impacted in both the distal and proximal fragments and then fracture was reduced held with a K wire adjusted K wire repeat position and once fluoroscopic images showed good position the olecranon plate was selected.  This allowed for bicortical screws distal to the pathologic lesion which was felt to be adequate and the extra long plate was not felt to be needed.  Distal bicortical screws were placed nonlocking.  Proximal tip was then bent over appropriately screws were placed adjusted in the long "homerun screw" was inserted bicortical and then additional flange screws had been bent down and nonlocking screws were placed.  All screw holes were filled including the oblong hole with a larger screw head.  Few locking screws were placed were appropriate.  AP lateral fluoroscopic images were taken for documentation of fixation there  was good fill of the lesion with bone graft.  Tourniquet was deflated tourniquet time less than an hour and a half.  Subtendinous  tissue reapproximated with Vicryl suture skin staple closure long-arm splint with cleaners between the fingers extra padding and then arm elevator sling in the recovery room.  Patient tolerated the procedure well.  Specimen was sent to pathology for permanent sections.

## 2019-11-24 NOTE — Discharge Instructions (Addendum)
Immunization Schedule, 65 Years and Older  Vaccines are usually given at various ages, according to a schedule. Your health care provider will recommend vaccines for you based on your age, medical history, and lifestyle or other factors such as travel or where you work. You may receive vaccines as individual doses or as more than one vaccine together in one shot (combination vaccines). Talk with your health care provider about the risks and benefits of combination vaccines. Recommended immunizations for 65 and older Influenza vaccine  You should get a dose of the influenza vaccine every year. Tetanus, diphtheria, and pertussis vaccine A vaccine that protects against tetanus, diphtheria, and pertussis is known as the Tdap vaccine. A vaccine that protects against tetanus and diphtheria is known as the Td vaccine.  You should only get the Td vaccine if you have had at least 1 dose of the Tdap vaccine.  You should get 1 dose of the Td or Tdap vaccine every 10 years, or you should get 1 dose of the Tdap vaccine if: ? You have not previously gotten a Tdap vaccine. ? You do not know if you have ever gotten a Tdap vaccine. Zoster vaccine This is also known as the RZV vaccine. You should get 2 doses of the RZV vaccine 2 to 6 months apart. It is important to get the RZV vaccine even if you:  Have had shingles.  Have received the ZVL vaccine, an older version of the RZV vaccine.  Are unsure if you have had chickenpox (varicella). Pneumococcal polysaccharide vaccine This is also known as the PPSV23 vaccine. You should get 1 dose of the PPSV23 vaccine. Pneumococcal conjugate vaccine This is also known as the PCV13 vaccine. Talk to your health care provider about whether it is appropriate for you to get the PCV13 vaccine. Hepatitis A vaccine This is also known as the HepA vaccine. If you did not get the HepA vaccine previously, you should get it if:  You are at risk for a hepatitis A infection.  You may be at risk for infection if you: ? Have chronic liver disease. ? Have HIV or AIDS. ? Are a man who has sex with men. ? Use drugs. ? Are homeless. ? May be exposed to hepatitis A through work. ? Travel to countries where hepatitis A is common. ? Have or will have close contact with someone who was adopted from another country.  You are not at risk for infection but want protection from hepatitis A. Hepatitis B vaccine This is also known as the HepB vaccine. If you did not get the HepB vaccine previously, you should get it if:  You are at risk for hepatitis B infection. You are at risk if you: ? Have chronic liver disease. ? Have HIV or AIDS. ? Have sex with a partner who has hepatitis B, or:  You have multiple sex partners.  You are a man who has sex with men. ? Use drugs. ? May be exposed to hepatitis B through work. ? Live with someone who has hepatitis B. ? Receive dialysis treatment. ? Have diabetes. ? Travel to countries where hepatitis B is common.  You are not at risk of infection but want protection from hepatitis B. Varicella vaccine This is also known as the VAR vaccine. You may need to get the VAR vaccine if you do not have evidence of immunity (by a blood test) and you may be exposed to varicella through work. This is especially important if you  work in a health care setting. Meningococcal conjugate vaccine This is also known as the MenACWY vaccine. You may need to get the MenACWY vaccine if you:  Have not been given the vaccine before.  Need to catch up on doses you missed in the past. This vaccine is especially important if you:  Do not have a spleen.  Have sickle cell disease.  Have HIV.  Take medicines that suppress your body's disease-fighting system (immune system).  Travel to countries where meningococcal disease is common.  Are exposed to Neisseria meningitidis at work. Serogroup B meningococcal vaccine This is also known as the MenB  vaccine. You may need to get the MenB vaccine if you:  Have not been given the vaccine before.  Need to catch up on doses you missed in the past. This vaccine is especially important if you:  Do not have a spleen.  Have sickle cell disease.  Take medicines that suppress your immune system.  Are exposed to Neisseria meningitidis at work. Haemophilus influenzae type b vaccine This is also known as the Hib vaccine. Anyone older than 66 years of age is usually not given the Hib vaccine. However, if you have certain high-risk conditions, you may need to get this vaccine. These conditions include:  Not having a spleen.  Having received a stem cell transplant. Before you get a vaccine: Talk with your health care provider about which vaccines are right for you. This is especially important if:  You previously had a reaction after getting a vaccine.  You have a weakened immune system. You may have a weakened immune system if you: ? Are taking medicines that reduce (suppress) the activity of your immune system. ? Are taking medicines to treat cancer (chemotherapy). ? Have HIV or AIDS.  You work in an environment where you may be exposed to a disease.  You plan to travel outside of the country.  You have a chronic illness, such as heart disease, kidney disease, diabetes, or lung disease. Summary  Before you get a vaccine, tell your health care provider if you have reacted to vaccines in the past or have a condition that weakens your immune system.  You should get a dose of the influenza vaccine every year and a dose of the Td or Tdap vaccine every 10 years.  You should get 2 doses of the RZV vaccine 2 to 6 months apart.  Depending on your medical history and your risk factors, you may need other vaccines. Ask your health care provider whether you are up to date on all your vaccines. This information is not intended to replace advice given to you by your health care provider. Make sure  you discuss any questions you have with your health care provider. Document Revised: 12/06/2018 Document Reviewed: 12/06/2018 Elsevier Patient Education  2020 [employer]. Do not remove splint/dressing or get wet!    Wear sling for comfort  Do not drive.   No lifting, pushing, pulling with left arm  If you have increased pain in left arm you should contact our office immediately.     Left lateral leg dressing changed to mepilex dressing Advised to allow abrasions to dry out as it has been 1 week and there is no sign of infection, keep staples covered with dry dressing.

## 2019-11-24 NOTE — Progress Notes (Signed)
11/24/19 1617  PT Visit Information  Last PT Received On 11/24/19  Assistance Needed +2 (safety)  PT/OT/SLP Co-Evaluation/Treatment Yes  Reason for Co-Treatment For patient/therapist safety  PT goals addressed during session Balance;Mobility/safety with mobility  History of Present Illness Pt is a 64 year old man who fell through his deck on 9/25 resulting in a L LE laceration and displaced L olecranon fx. Admitted 11/24/19 for ORIF of elbow. PMH: psoriatic arthritis with severely limited ROM of R elbow and B knees, R THA, R TKA, ORIF tibia.   Precautions  Precautions Fall  Required Braces or Orthoses Sling  Restrictions  Other Position/Activity Restrictions Assumed NWB on LUE   Home Living  Family/patient expects to be discharged to: Private residence  Living Arrangements Spouse/significant other  Available Help at Discharge Family;Available 24 hours/day  Type of Home House  Home Access Ramped entrance  Home Layout One level  Bathroom Shower/Tub Walk-in Counsellor riser;Grab bars - toilet;Grab bars - tub/shower;Walker - 2 wheels;Adaptive equipment;Other (comment) (lift chair)  Adaptive Equipment Feeding equipment  Prior Function  Level of Independence Needs assistance  Gait / Transfers Assistance Needed walks without a device, but requires maximum elevation of lift chair to stand with wife's using gait belt under his arms  ADL's / Homemaking Assistance Needed self feeds with adaptive equipment, assisted for bathing, dressing and toileting  Communication  Communication No difficulties  Pain Assessment  Pain Assessment Faces  Faces Pain Scale 4  Pain Location IV site  Pain Descriptors / Indicators Grimacing  Pain Intervention(s) Repositioned  Cognition  Arousal/Alertness Awake/alert  Behavior During Therapy WFL for tasks assessed/performed  Overall Cognitive Status Within Functional Limits for tasks assessed  Upper Extremity  Assessment  Upper Extremity Assessment Defer to OT evaluation  Lower Extremity Assessment  Lower Extremity Assessment RLE deficits/detail;LLE deficits/detail  RLE Deficits / Details Extremely limited knee flexion. knees fixated in extended position secondary to psoriatic arthritis.   LLE Deficits / Details Extremely limited knee flexion. knees fixated in extended position secondary to psoriatic arthritis. Slightly more knee flexion in LLE after fall. Laceration wrapped.   Cervical / Trunk Assessment  Cervical / Trunk Assessment Other exceptions  Cervical / Trunk Exceptions obesity  Bed Mobility  Overal bed mobility Needs Assistance  Bed Mobility Supine to Sit;Sit to Supine  Supine to sit +2 for physical assistance;Mod assist  Sit to supine Mod assist;+2 for physical assistance  General bed mobility comments HOB up, assist for LEs over EOB and to raise trunk, pt able to move "walk" hips out to EOB  Transfers  Overall transfer level Needs assistance  Equipment used None  Transfers Sit to/from Stand  Sit to Stand Min assist;+2 safety/equipment  General transfer comment Bed raised substantially to mimic lift recliner at home, pt uses momentum to stand, assist to rise and steady  Ambulation/Gait  Ambulation/Gait assistance Min guard;Min assist;+2 safety/equipment  Gait Distance (Feet) 200 Feet  Assistive device None  Gait Pattern/deviations Step-through pattern;Wide base of support  General Gait Details Waddle type gait. Very limited knee flexion bilaterally. Mild LOB with dip in floor requiring min A for steadying.   Gait velocity Decreased  Balance  Overall balance assessment Needs assistance  Sitting-balance support No upper extremity supported  Sitting balance-Leahy Scale Fair  Standing balance support No upper extremity supported  Standing balance-Leahy Scale Fair  PT - End of Session  Equipment Utilized During Treatment Gait belt;Other (comment) (sling)  Activity Tolerance  Patient tolerated treatment well  Patient left in bed;with call bell/phone within reach  Nurse Communication Mobility status  PT Assessment  PT Recommendation/Assessment Patient needs continued PT services  PT Visit Diagnosis Other abnormalities of gait and mobility (R26.89)  PT Problem List Decreased range of motion;Decreased balance;Decreased strength;Decreased mobility  PT Plan  PT Frequency (ACUTE ONLY) Min 5X/week  PT Treatment/Interventions (ACUTE ONLY) Gait training;DME instruction;Functional mobility training;Therapeutic activities;Therapeutic exercise;Balance training;Patient/family education  AM-PAC PT "6 Clicks" Mobility Outcome Measure (Version 2)  Help needed turning from your back to your side while in a flat bed without using bedrails? 2  Help needed moving from lying on your back to sitting on the side of a flat bed without using bedrails? 2  Help needed moving to and from a bed to a chair (including a wheelchair)? 3  Help needed standing up from a chair using your arms (e.g., wheelchair or bedside chair)? 3  Help needed to walk in hospital room? 3  Help needed climbing 3-5 steps with a railing?  2  6 Click Score 15  Consider Recommendation of Discharge To: CIR/SNF/LTACH  PT Recommendation  Follow Up Recommendations Home health PT;Supervision for mobility/OOB  PT equipment None recommended by PT  Individuals Consulted  Consulted and Agree with Results and Recommendations Patient  Acute Rehab PT Goals  Patient Stated Goal return home to his dogs  PT Goal Formulation With patient  Time For Goal Achievement 12/08/19  Potential to Achieve Goals Good  PT Time Calculation  PT Start Time (ACUTE ONLY) 1458  PT Stop Time (ACUTE ONLY) 1533  PT Time Calculation (min) (ACUTE ONLY) 35 min  PT General Charges  $$ ACUTE PT VISIT 1 Visit  PT Evaluation  $PT Eval Moderate Complexity 1 Mod  Written Expression  Dominant Hand Right   Pt admitted secondary to problem above with  deficits below. Pt requiring mod A +2 for bed mobility and min A +2 to stand from maximally elevated bed height. Pt extremely limited in knee flexion at baseline. Per pt, wife assists with mobility at home. Did administer gait belt to assist with transfers at home. Feel pt would benefit from HHPT to ensure safety at home. Will continue to follow acutely.   Farley Ly, PT, DPT  Acute Rehabilitation Services  Pager: [phone] Office: [phone]

## 2019-11-25 DIAGNOSIS — M84432A Pathological fracture, left ulna, initial encounter for fracture: Secondary | ICD-10-CM | POA: Diagnosis not present

## 2019-11-25 LAB — BASIC METABOLIC PANEL
Anion gap: 14 (ref 5–15)
BUN: 12 mg/dL (ref 8–23)
CO2: 24 mmol/L (ref 22–32)
Calcium: 8.7 mg/dL — ABNORMAL LOW (ref 8.9–10.3)
Chloride: 98 mmol/L (ref 98–111)
Creatinine, Ser: 0.86 mg/dL (ref 0.61–1.24)
GFR calc Af Amer: >60 mL/min (ref >60)
GFR calc non Af Amer: >60 mL/min (ref >60)
Glucose, Bld: 112 mg/dL — ABNORMAL HIGH (ref 70–99)
Potassium: 3.6 mmol/L (ref 3.5–5.1)
Sodium: 136 mmol/L (ref 135–145)

## 2019-11-25 LAB — CBC
HCT: 45.7 % (ref 39.0–52.0)
Hemoglobin: 15.2 g/dL (ref 13.0–17.0)
MCH: 28.4 pg (ref 26.0–34.0)
MCHC: 33.3 g/dL (ref 30.0–36.0)
MCV: 85.3 fL (ref 80.0–100.0)
Platelets: 242 10*3/uL (ref 150–400)
RBC: 5.36 MIL/uL (ref 4.22–5.81)
RDW: 12.1 % (ref 11.5–15.5)
WBC: 11.5 10*3/uL — ABNORMAL HIGH (ref 4.0–10.5)
nRBC: 0 % (ref 0.0–0.2)

## 2019-11-25 MED ORDER — HYDROCODONE-ACETAMINOPHEN 5-325 MG PO TABS: 1.0000 | ORAL_TABLET | ORAL | 0 refills | Status: AC | PRN

## 2019-11-25 MED ORDER — DOCUSATE SODIUM 100 MG PO CAPS: 100.0000 mg | ORAL_CAPSULE | Freq: Two times a day (BID) | ORAL | 0 refills | Status: DC

## 2019-11-25 MED ORDER — ALUM & MAG HYDROXIDE-SIMETH 200-200-20 MG/5ML PO SUSP
30.0000 mL | Freq: Four times a day (QID) | ORAL | Status: DC | PRN
  Administered 2019-11-25: 30 mL via ORAL
  Filled 2019-11-25: qty 30

## 2019-11-25 MED ORDER — HYDROCODONE-ACETAMINOPHEN 5-325 MG PO TABS
1.0000 | ORAL_TABLET | ORAL | Status: DC | PRN
  Administered 2019-11-25: 1 via ORAL
  Filled 2019-11-25: qty 1

## 2019-11-25 NOTE — Care Management Obs Status (Signed)
MEDICARE OBSERVATION STATUS NOTIFICATION   Patient Details  Name: Roy [name] MRN: [id number] Date of Birth: [birth year]   Medicare Observation Status Notification Given:  Yes    Deveron Furlong, RN 11/25/2019, 10:59 AM

## 2019-11-25 NOTE — Progress Notes (Signed)
Occupational Therapy Treatment Patient Details Name: Roy [name] MRN: [id number] DOB: [birth year] Today's Date: 11/25/2019    History of present illness Pt is a 64 year old man who fell through his deck on 9/25 resulting in a L LE laceration and displaced L olecranon fx. Admitted 11/24/19 for ORIF of elbow. PMH: psoriatic arthritis with severely limited ROM of R elbow and B knees, R THA, R TKA, ORIF tibia.    OT comments  Pt seen with PT for therapist/pt safety due to functional limitations and decreased stability. Pt requires total assistance for grooming due to functional limitations of BUE, he utilized adaptive equipment for feeding. He requires +2 min assistance for functional mobility and 1 person hand held assistance for stability and safety. Continued to educate pt on proper positioning of LUE and sling, importance of elevation and mobilizing digits for edema control. Pt will continue to benefit from continued OT services acutely to maximize safety and independence with functional mobility. Recommend follow-up OT services per surgeon recommendation. Will continue to follow acutely.    Follow Up Recommendations  Follow surgeons recommendation for DC plan and follow-up therapies (supervision with mobility)    Equipment Recommendations  None recommended by OT    Recommendations for Other Services      Precautions / Restrictions Precautions Precautions: Fall Required Braces or Orthoses: Sling Restrictions Other Position/Activity Restrictions: Assumed NWB on LUE        Mobility Bed Mobility Overal bed mobility: Needs Assistance Bed Mobility: Supine to Sit;Sit to Supine     Supine to sit: +2 for physical assistance;Mod assist Sit to supine: Mod assist;+2 for physical assistance   General bed mobility comments: HOB elevated, pt able to progress legs to EOB, assistance to elevate trunk and scoot hips   Transfers Overall transfer level: Needs assistance Equipment used: 1  person hand held assist Transfers: Sit to/from Stand Sit to Stand: Min assist;+2 safety/equipment         General transfer comment: bed significantly elevated to mimic lift recliner at home, pt requires momentum to stand, support for stability    Balance Overall balance assessment: Needs assistance Sitting-balance support: No upper extremity supported;Feet supported Sitting balance-Leahy Scale: Fair     Standing balance support: Single extremity supported Standing balance-Leahy Scale: Fair Standing balance comment: demonstated improved balance with single UE support, able to static stand without UE support but instability noted                           ADL either performed or assessed with clinical judgement   ADL Overall ADL's : Needs assistance/impaired Eating/Feeding: Minimal assistance;Bed level Eating/Feeding Details (indicate cue type and reason): using adaptive eating utensils Grooming: Total assistance;Sitting Grooming Details (indicate cue type and reason): requires totalA to wipe eyes/wash face         Upper Body Dressing : Total assistance Upper Body Dressing Details (indicate cue type and reason): continued to educate pt on proper positioning of LUE and of sling, educated pt on adaptive/compensatory strategies      Toilet Transfer: +2 for safety/equipment;Ambulation;Minimal assistance Toilet Transfer Details (indicate cue type and reason): simulated in room, pt would require raised toilet seat Toileting- Clothing Manipulation and Hygiene: Total assistance       Functional mobility during ADLs: Minimal assistance;+2 for safety/equipment General ADL Comments: pt with functional limitations due to ROM limitations in BUE and BLE     Vision       Perception  Praxis      Cognition Arousal/Alertness: Awake/alert Behavior During Therapy: WFL for tasks assessed/performed Overall Cognitive Status: Within Functional Limits for tasks assessed                                           Exercises     Shoulder Instructions       General Comments vss    Pertinent Vitals/ Pain       Pain Assessment: Faces Faces Pain Scale: Hurts little more Pain Location: left elbow Pain Descriptors / Indicators: Grimacing;Guarding Pain Intervention(s): Limited activity within patient's tolerance;Monitored during session;Repositioned  Home Living                                          Prior Functioning/Environment              Frequency  Min 2X/week        Progress Toward Goals  OT Goals(current goals can now be found in the care plan section)  Progress towards OT goals: Progressing toward goals  Acute Rehab OT Goals Patient Stated Goal: return home to his dogs OT Goal Formulation: With patient Potential to Achieve Goals: Good ADL Goals Pt Will Transfer to Toilet: with min guard assist;ambulating;bedside commode Pt/caregiver will Perform Home Exercise Program: Increased ROM;Left upper extremity;Independently (L shoulder AROM) Additional ADL Goal #1: Pt and caregiver will be knowledgeable in positioning in chair/bed, sling use and edema management of L UE. Additional ADL Goal #2: Pt and caregiver will be knowledgeable in compensatory strategies for ADL.  Plan Discharge plan remains appropriate    Co-evaluation    PT/OT/SLP Co-Evaluation/Treatment: Yes Reason for Co-Treatment: For patient/therapist safety;To address functional/ADL transfers   OT goals addressed during session: ADL's and self-care      AM-PAC OT "6 Clicks" Daily Activity     Outcome Measure   Help from another person eating meals?: A Lot Help from another person taking care of personal grooming?: Total Help from another person toileting, which includes using toliet, bedpan, or urinal?: Total Help from another person bathing (including washing, rinsing, drying)?: Total Help from another person to put on and taking  off regular upper body clothing?: A Lot Help from another person to put on and taking off regular lower body clothing?: Total 6 Click Score: 8    End of Session Equipment Utilized During Treatment: Gait belt  OT Visit Diagnosis: Other abnormalities of gait and mobility (R26.89);Pain;Muscle weakness (generalized) (M62.81) Pain - Right/Left: Left Pain - part of body: Arm   Activity Tolerance Patient tolerated treatment well   Patient Left in bed;with call bell/phone within reach   Nurse Communication Mobility status (avoid chair, use BSC at max height, adapt straw and IS on ta)        Time: 1610-9604 OT Time Calculation (min): 24 min  Charges: OT General Charges $OT Visit: 1 Visit OT Treatments $Self Care/Home Management : 8-22 mins  Rosey Bath OTR/L Acute Rehabilitation Services Office: [phone]    Rebeca Alert 11/25/2019, 10:21 AM

## 2019-11-25 NOTE — Progress Notes (Signed)
     Subjective: 1 Day Post-Op Procedure(s) (LRB): BIOPSY, OPEN REDUCTION INTERNAL FIXATION (ORIF) LEFT ELBOW/OLECRANON FRACTURE (Left) Awake, alert and oriented x 4. Requests dressing change of left leg. Complaints of nausea with oxycodone, this was sent to pharmacy for discharge today will change to hydrocodone as this has been tolerated in the past.   Patient reports pain as moderate.    Objective:   VITALS:  Temp:  [97.6 F (36.4 C)-98.3 F (36.8 C)] 98 F (36.7 C) (10/02 0722) Pulse Rate:  [72-93] 85 (10/02 0722) Resp:  [13-24] 19 (10/02 0722) BP: (129-162)/(68-100) 142/77 (10/02 0722) SpO2:  [90 %-98 %] 92 % (10/02 0722)  Neurologically intact ABD soft Neurovascular intact Sensation intact distally Intact pulses distally Dorsiflexion/Plantar flexion intact Incision: dressing C/D/I and left lateral leg dressing changed to mepilex dressing Advised to allow abrasions to dry out as it has been 1 week and there is no sign of infection, keep staples covered with dry dressing. Compartment soft   LABS Recent Labs    11/24/19 0641 11/25/19 0616  HGB 16.0 15.2  WBC 7.8 11.5*  PLT 265 242   Recent Labs    11/24/19 0641 11/25/19 0616  NA 134* 136  K 3.9 3.6  CL 101 98  CO2 23 24  BUN 14 12  CREATININE 0.66 0.86  GLUCOSE 107* 112*   No results for input(s): LABPT, INR in the last 72 hours.   Assessment/Plan: 1 Day Post-Op Procedure(s) (LRB): BIOPSY, OPEN REDUCTION INTERNAL FIXATION (ORIF) LEFT ELBOW/OLECRANON FRACTURE (Left)  Advance diet Up with therapy Discharge home  Vira Browns 11/25/2019, 9:41 AM

## 2019-11-25 NOTE — TOC Transition Note (Signed)
Transition of Care [facility]) - CM/SW Discharge Note   Patient Details  Name: DUVAL MACLEOD MRN: [id number] Date of Birth: Jul 21, 1955  Transition of Care [facility]) CM/SW Contact:  Deveron Furlong, RN 11/25/2019, 10:55 AM   Clinical Narrative:    Patient to d/c home with [facility] and spouse.  Patient requests [facility].  Referral accepted by [facility] with [facility].   Final next level of care: Home w Home Health Services Barriers to Discharge: No Barriers Identified   Patient Goals and CMS Choice   CMS Medicare.gov Compare Post Acute Care list provided to:: Patient Choice offered to / list presented to : Patient   Discharge Plan and Services        HH Arranged: PT, Nurse's Aide [facility] Agency: [facility] Date [facility] Agency Contacted: 11/25/19 Time HH Agency Contacted: [phone] Representative spoke with at [facility] Agency: Barbara Cower

## 2019-11-25 NOTE — Plan of Care (Signed)
Patient alert and oriented, mae's well, voiding adequate amount of urine, swallowing without difficulty, no c/o pain at time of discharge. Patient discharged home with family. Script and discharged instructions given to patient. Patient and family stated understanding of instructions given. Patient has an appointment with Dr. Yates  

## 2019-11-25 NOTE — Progress Notes (Addendum)
Physical Therapy Treatment Patient Details Name: Roy [name] MRN: [id number] DOB: 28-May-1955 Today's Date: 11/25/2019    History of Present Illness Pt is a 64 year old man who fell through his deck on 9/25 resulting in a L LE laceration and displaced L olecranon fx. Admitted 11/24/19 for ORIF of elbow. PMH: psoriatic arthritis with severely limited ROM of R elbow and B knees, R THA, R TKA, ORIF tibia.     PT Comments    Pt progressing towards physical therapy goals. Was able to perform transfers and ambulation with +2 assist for max safety. Pt reporting typical use of LUE for ADL's such as brushing teeth, washing face and dressing, and will now require total assist due to minimal elbow flexion on the R. Pt has a very specific way to mobilize that works for him, and PT did not try to change his routine, however cues given throughout for max safety.  It appears he will be able to function fairly well from his lift chair. Will continue to follow.   Follow Up Recommendations  Home health PT;Supervision for mobility/OOB     Equipment Recommendations  None recommended by PT    Recommendations for Other Services       Precautions / Restrictions Precautions Precautions: Fall Precaution Comments: Very minimal ROM at R elbow, Very minimal knee flexion bilaterally Required Braces or Orthoses: Sling Restrictions Weight Bearing Restrictions: Yes LUE Weight Bearing: Non weight bearing Other Position/Activity Restrictions: Assumed NWB on LUE     Mobility  Bed Mobility Overal bed mobility: Needs Assistance Bed Mobility: Supine to Sit;Sit to Supine     Supine to sit: +2 for physical assistance;Mod assist Sit to supine: Mod assist;+2 for physical assistance   General bed mobility comments: HOB elevated, pt able to progress legs to EOB, assistance to elevate trunk and scoot hips   Transfers Overall transfer level: Needs assistance Equipment used: 1 person hand held assist Transfers:  Sit to/from Stand Sit to Stand: Min assist;Mod assist;+2 safety/equipment         General transfer comment: bed significantly elevated to mimic lift recliner at home, pt requires momentum to stand, support for stability. Increased assist once standing for anterior weight shift and to get COM over BOS.    Ambulation/Gait Ambulation/Gait assistance: Min guard;Min assist;+2 safety/equipment Gait Distance (Feet): 175 Feet Assistive device: 1 person hand held assist Gait Pattern/deviations: Step-through pattern;Wide base of support Gait velocity: Decreased Gait velocity interpretation: <1.31 ft/sec, indicative of household ambulator General Gait Details: Waddle type gait. Very limited knee flexion bilaterally. Occasional assist for balance support throughout gait training.   Stairs             Wheelchair Mobility    Modified Rankin (Stroke Patients Only)       Balance Overall balance assessment: Needs assistance Sitting-balance support: No upper extremity supported;Feet supported Sitting balance-Leahy Scale: Fair     Standing balance support: Single extremity supported Standing balance-Leahy Scale: Fair Standing balance comment: demonstated improved balance with single UE support, able to static stand without UE support but instability noted                            Cognition Arousal/Alertness: Awake/alert Behavior During Therapy: WFL for tasks assessed/performed Overall Cognitive Status: Within Functional Limits for tasks assessed  Exercises      General Comments General comments (skin integrity, edema, etc.): vss      Pertinent Vitals/Pain Pain Assessment: Faces Faces Pain Scale: Hurts little more Pain Location: left elbow Pain Descriptors / Indicators: Grimacing;Guarding Pain Intervention(s): Limited activity within patient's tolerance;Monitored during session;Repositioned    Home Living                       Prior Function            PT Goals (current goals can now be found in the care plan section) Acute Rehab PT Goals Patient Stated Goal: return home to his dogs PT Goal Formulation: With patient Time For Goal Achievement: 12/08/19 Potential to Achieve Goals: Good Progress towards PT goals: Progressing toward goals    Frequency    Min 5X/week      PT Plan Current plan remains appropriate    Co-evaluation PT/OT/SLP Co-Evaluation/Treatment: Yes Reason for Co-Treatment: For patient/therapist safety;To address functional/ADL transfers;Complexity of the patient's impairments (multi-system involvement) PT goals addressed during session: Mobility/safety with mobility;Balance;Strengthening/ROM OT goals addressed during session: ADL's and self-care      AM-PAC PT "6 Clicks" Mobility   Outcome Measure  Help needed turning from your back to your side while in a flat bed without using bedrails?: A Lot Help needed moving from lying on your back to sitting on the side of a flat bed without using bedrails?: A Lot Help needed moving to and from a bed to a chair (including a wheelchair)?: A Little Help needed standing up from a chair using your arms (e.g., wheelchair or bedside chair)?: A Little Help needed to walk in hospital room?: A Little Help needed climbing 3-5 steps with a railing? : Total 6 Click Score: 14    End of Session Equipment Utilized During Treatment: Gait belt;Other (comment) (sling) Activity Tolerance: Patient tolerated treatment well Patient left: in bed;with call bell/phone within reach Nurse Communication: Mobility status PT Visit Diagnosis: Other abnormalities of gait and mobility (R26.89)     Time: 7672-0947 PT Time Calculation (min) (ACUTE ONLY): 32 min  Charges:  $Gait Training: 8-22 mins                     Conni Slipper, PT, DPT Acute Rehabilitation Services Pager: [phone] Office: [phone]    Marylynn Pearson 11/25/2019, 1:58 PM

## 2019-11-28 ENCOUNTER — Ambulatory Visit: Payer: Medicare Other | Admitting: Orthopaedic Surgery

## 2019-11-28 LAB — SURGICAL PATHOLOGY

## 2019-11-28 NOTE — Discharge Summary (Signed)
Patient ID: Roy [name] MRN: [id number] DOB/AGE: 07/10/1955 64 y.o.  Admit date: 11/24/2019 Discharge date: 11/25/2019 Admission Diagnoses:  Active Problems:   Psoriatic arthritis, destructive type (HCC)   Elbow fracture, left   Discharge Diagnoses:  Active Problems:   Psoriatic arthritis, destructive type (HCC)   Elbow fracture, left  status post Procedure(s): BIOPSY, OPEN REDUCTION INTERNAL FIXATION (ORIF) LEFT ELBOW/OLECRANON FRACTURE  Past Medical History:  Diagnosis Date  . Arthritis   . Drug addict (HCC)    Clean for 20 years  . Psoriatic arthritis (HCC)   . Seasonal allergies     Surgeries: Procedure(s): BIOPSY, OPEN REDUCTION INTERNAL FIXATION (ORIF) LEFT ELBOW/OLECRANON FRACTURE on 11/24/2019   Consultants:   Discharged Condition: Improved  Hospital Course: Roy [name] is an 64 y.o. male who was admitted 11/24/2019 for operative treatment of olecranon fracture. Patient failed conservative treatments (please see the history and physical for the specifics) and had severe unremitting pain that affects sleep, daily activities and work/hobbies. After pre-op clearance, the patient was taken to the operating room on 11/24/2019 and underwent  Procedure(s): BIOPSY, OPEN REDUCTION INTERNAL FIXATION (ORIF) LEFT ELBOW/OLECRANON FRACTURE.    Patient was given perioperative antibiotics:  Anti-infectives (From admission, onward)   Start     Dose/Rate Route Frequency Ordered Stop   11/24/19 1530  ceFAZolin (ANCEF) IVPB 1 g/50 mL premix        1 g 100 mL/hr over 30 Minutes Intravenous Every 8 hours 11/24/19 1126 11/25/19 0708   11/24/19 0602  ceFAZolin (ANCEF) 2-4 GM/100ML-% IVPB       Note to Pharmacy: Ernie Avena   : cabinet override      11/24/19 0602 11/24/19 0801   11/24/19 0600  ceFAZolin (ANCEF) IVPB 2g/100 mL premix        2 g 200 mL/hr over 30 Minutes Intravenous On call to O.R. 11/24/19 0557 11/24/19 0810       Patient was given sequential  compression devices and early ambulation to prevent DVT.   Patient benefited maximally from hospital stay and there were no complications. At the time of discharge, the patient was urinating/moving their bowels without difficulty, tolerating a regular diet, pain is controlled with oral pain medications and they have been cleared by PT/OT.   Recent vital signs: No data found.   Recent laboratory studies: No results for input(s): WBC, HGB, HCT, PLT, NA, K, CL, CO2, BUN, CREATININE, GLUCOSE, INR, CALCIUM in the last 72 hours.  Invalid input(s): PT, 2   Discharge Medications:   Allergies as of 11/25/2019      Reactions   Demerol [meperidine] Nausea And Vomiting   Morphine And Related Nausea And Vomiting   Oxycodone    Patient preference (History of drug abuse)      Medication List    STOP taking these medications   acetaminophen-codeine 300-30 MG tablet Commonly known as: TYLENOL #3   cephALEXin 500 MG capsule Commonly known as: KEFLEX   doxycycline 100 MG capsule Commonly known as: VIBRAMYCIN   ibuprofen 200 MG tablet Commonly known as: ADVIL   naproxen sodium 220 MG tablet Commonly known as: ALEVE     TAKE these medications   docusate sodium 100 MG capsule Commonly known as: COLACE Take 1 capsule (100 mg total) by mouth 2 (two) times daily.   eucerin cream Apply 1 application topically 3 (three) times daily.   fluticasone 50 MCG/ACT nasal spray Commonly known as: FLONASE Place 2 sprays into both nostrils daily as  needed for allergies or rhinitis.   HYDROcodone-acetaminophen 5-325 MG tablet Commonly known as: NORCO/VICODIN Take 1-2 tablets by mouth every 4 (four) hours as needed for up to 7 days for severe pain.   multivitamin with minerals Tabs tablet Take 1 tablet by mouth daily.       Diagnostic Studies: DG Chest 2 View  Result Date: 11/24/2019 CLINICAL DATA:  Preop EXAM: CHEST - 2 VIEW COMPARISON:  Two days ago FINDINGS: Normal heart size and mediastinal  contours. No acute infiltrate or edema. No effusion or pneumothorax. Spondylosis of the thoracic spine. No acute osseous findings. IMPRESSION: No active cardiopulmonary disease. Electronically Signed   By: Marnee Spring M.D.   On: 11/24/2019 06:40   DG Elbow 2 Views Left  Result Date: 11/24/2019 CLINICAL DATA:  Displaced olecranon fracture EXAM: LEFT ELBOW - 2 VIEW; DG C-ARM 1-60 MIN Fluoroscopy: 26 seconds, 0.88 mGy COMPARISON:  11/18/2019 FINDINGS: Limited spot fluoroscopic views demonstrate plate and screw fixation of the olecranon fracture with anatomic alignment. No complicating feature. Left elbow joint aligned. Scalloped appearing lytic/lucent lesion of the olecranon region at the fracture site is better demonstrated by plain radiography. IMPRESSION: Status post ORIF of the left elbow olecranon fracture with anatomic alignment. Indeterminate olecranon lytic/lucent lesion Electronically Signed   By: Judie Petit.  Shick M.D.   On: 11/24/2019 09:35   DG Elbow Complete Left  Result Date: 11/18/2019 CLINICAL DATA:  Recent fall with left elbow pain, initial encounter EXAM: LEFT ELBOW - COMPLETE 3+ VIEW COMPARISON:  None. FINDINGS: Proximal ulnar fracture is noted with approximately 2 cm of distraction of the fracture fragments. Joint effusion is seen. Soft tissue swelling is noted in this region. IMPRESSION: Proximal ulnar fracture as described Electronically Signed   By: Alcide Clever M.D.   On: 11/18/2019 13:31   DG Tibia/Fibula Left  Result Date: 11/18/2019 CLINICAL DATA:  Recent fall with left leg pain, initial encounter EXAM: LEFT TIBIA AND FIBULA - 2 VIEW COMPARISON:  12/21/2006 FINDINGS: Postsurgical changes are noted in the proximal tibia. No hardware failure is seen. No acute fracture is noted. Degenerative changes about the left knee joint are seen. IMPRESSION: Postsurgical changes in the proximal tibia. No acute abnormality is noted. Electronically Signed   By: Alcide Clever M.D.   On: 11/18/2019 13:30    DG C-Arm 1-60 Min  Result Date: 11/24/2019 CLINICAL DATA:  Displaced olecranon fracture EXAM: LEFT ELBOW - 2 VIEW; DG C-ARM 1-60 MIN Fluoroscopy: 26 seconds, 0.88 mGy COMPARISON:  11/18/2019 FINDINGS: Limited spot fluoroscopic views demonstrate plate and screw fixation of the olecranon fracture with anatomic alignment. No complicating feature. Left elbow joint aligned. Scalloped appearing lytic/lucent lesion of the olecranon region at the fracture site is better demonstrated by plain radiography. IMPRESSION: Status post ORIF of the left elbow olecranon fracture with anatomic alignment. Indeterminate olecranon lytic/lucent lesion Electronically Signed   By: Judie Petit.  Shick M.D.   On: 11/24/2019 09:35   XR Chest 2 View  Result Date: 11/21/2019 PA chest film was obtained and compared to chest film from 2014.  No active disease   Discharge Instructions    Call MD / Call 911   Complete by: As directed    If you experience chest pain or shortness of breath, CALL 911 and be transported to the hospital emergency room.  If you develope a fever above 101 F, pus (white drainage) or increased drainage or redness at the wound, or calf pain, call your surgeon's office.   Constipation Prevention  Complete by: As directed    Drink plenty of fluids.  Prune juice may be helpful.  You may use a stool softener, such as Colace (over the counter) 100 mg twice a day.  Use MiraLax (over the counter) for constipation as needed.   Diet - low sodium heart healthy   Complete by: As directed    Discharge instructions   Complete by: As directed    Immunization Schedule, 65 Years and Older  Vaccines are usually given at various ages, according to a schedule. Your health care provider will recommend vaccines for you based on your age, medical history, and lifestyle or other factors such as travel or where you work. You may receive vaccines as individual doses or as more than one vaccine together in one shot (combination  vaccines). Talk with your health care provider about the risks and benefits of combination vaccines. Recommended immunizations for 65 and older Influenza vaccine You should get a dose of the influenza vaccine every year. Tetanus, diphtheria, and pertussis vaccine A vaccine that protects against tetanus, diphtheria, and pertussis is known as the Tdap vaccine. A vaccine that protects against tetanus and diphtheria is known as the Td vaccine. You should only get the Td vaccine if you have had at least 1 dose of the Tdap vaccine. You should get 1 dose of the Td or Tdap vaccine every 10 years, or you should get 1 dose of the Tdap vaccine if: You have not previously gotten a Tdap vaccine. You do not know if you have ever gotten a Tdap vaccine. Zoster vaccine This is also known as the RZV vaccine. You should get 2 doses of the RZV vaccine 2 to 6 months apart. It is important to get the RZV vaccine even if you: Have had shingles. Have received the ZVL vaccine, an older version of the RZV vaccine. Are unsure if you have had chickenpox (varicella). Pneumococcal polysaccharide vaccine This is also known as the PPSV23 vaccine. You should get 1 dose of the PPSV23 vaccine. Pneumococcal conjugate vaccine This is also known as the PCV13 vaccine. Talk to your health care provider about whether it is appropriate for you to get the PCV13 vaccine. Hepatitis A vaccine This is also known as the HepA vaccine. If you did not get the HepA vaccine previously, you should get it if: You are at risk for a hepatitis A infection. You may be at risk for infection if you: Have chronic liver disease. Have HIV or AIDS. Are a man who has sex with men. Use drugs. Are homeless. May be exposed to hepatitis A through work. Travel to countries where hepatitis A is common. Have or will have close contact with someone who was adopted from another country. You are not at risk for infection but want protection from hepatitis  A. Hepatitis B vaccine This is also known as the HepB vaccine. If you did not get the HepB vaccine previously, you should get it if: You are at risk for hepatitis B infection. You are at risk if you: Have chronic liver disease. Have HIV or AIDS. Have sex with a partner who has hepatitis B, or: You have multiple sex partners. You are a man who has sex with men. Use drugs. May be exposed to hepatitis B through work. Live with someone who has hepatitis B. Receive dialysis treatment. Have diabetes. Travel to countries where hepatitis B is common. You are not at risk of infection but want protection from hepatitis B. Varicella vaccine This  is also known as the VAR vaccine. You may need to get the VAR vaccine if you do not have evidence of immunity (by a blood test) and you may be exposed to varicella through work. This is especially important if you work in a health care setting. Meningococcal conjugate vaccine This is also known as the MenACWY vaccine. You may need to get the MenACWY vaccine if you: Have not been given the vaccine before. Need to catch up on doses you missed in the past. This vaccine is especially important if you: Do not have a spleen. Have sickle cell disease. Have HIV. Take medicines that suppress your body's disease-fighting system (immune system). Travel to countries where meningococcal disease is common. Are exposed to Neisseria meningitidis at work. Serogroup B meningococcal vaccine This is also known as the MenB vaccine. You may need to get the MenB vaccine if you: Have not been given the vaccine before. Need to catch up on doses you missed in the past. This vaccine is especially important if you: Do not have a spleen. Have sickle cell disease. Take medicines that suppress your immune system. Are exposed to Neisseria meningitidis at work. Haemophilus influenzae type b vaccine This is also known as the Hib vaccine. Anyone older than 66 years of age is usually  not given the Hib vaccine. However, if you have certain high-risk conditions, you may need to get this vaccine. These conditions include: Not having a spleen. Having received a stem cell transplant. Before you get a vaccine: Talk with your health care provider about which vaccines are right for you. This is especially important if: You previously had a reaction after getting a vaccine. You have a weakened immune system. You may have a weakened immune system if you: Are taking medicines that reduce (suppress) the activity of your immune system. Are taking medicines to treat cancer (chemotherapy). Have HIV or AIDS. You work in an environment where you may be exposed to a disease. You plan to travel outside of the country. You have a chronic illness, such as heart disease, kidney disease, diabetes, or lung disease. Summary Before you get a vaccine, tell your health care provider if you have reacted to vaccines in the past or have a condition that weakens your immune system. You should get a dose of the influenza vaccine every year and a dose of the Td or Tdap vaccine every 10 years. You should get 2 doses of the RZV vaccine 2 to 6 months apart. Depending on your medical history and your risk factors, you may need other vaccines. Ask your health care provider whether you are up to date on all your vaccines. This information is not intended to replace advice given to you by your health care provider. Make sure you discuss any questions you have with your health care provider. Document Revised: 12/06/2018 Document Reviewed: 12/06/2018 Elsevier Patient Education  2020 [employer]. Do not remove splint/dressing or get wet!    Wear sling for comfort  Do not drive.   No lifting, pushing, pulling with left arm  If you have increased pain in left arm you should contact our office immediately.     Left lateral leg dressing changed to mepilex dressing Advised to allow abrasions to dry out as it  has been 1 week and there is no sign of infection, keep staples covered with dry dressing.   Driving restrictions   Complete by: As directed    No driving for 4 weeks or while taking  narcotics.   Increase activity slowly as tolerated   Complete by: As directed    Lifting restrictions   Complete by: As directed    No lifting for 8 weeks       Follow-up Information    Schedule an appointment as soon as possible for a visit with Eldred Manges, MD.   Specialty: Orthopedic Surgery Why: need return office visit one week postop.  call to schedule appointment.  Contact information: [address] [place] [address] [phone]               Discharge Plan:  discharge to home Disposition:     Signed: Zonia Kief  11/28/2019, 2:07 PM

## 2019-11-29 ENCOUNTER — Encounter (HOSPITAL_COMMUNITY): Payer: Self-pay | Admitting: Orthopaedic Surgery

## 2019-12-05 ENCOUNTER — Ambulatory Visit: Payer: Self-pay

## 2019-12-05 ENCOUNTER — Encounter: Payer: Self-pay | Admitting: Orthopaedic Surgery

## 2019-12-05 ENCOUNTER — Ambulatory Visit (INDEPENDENT_AMBULATORY_CARE_PROVIDER_SITE_OTHER): Payer: Medicare Other | Admitting: Orthopaedic Surgery

## 2019-12-05 VITALS — BP 129/85 | HR 89 | Ht 66.0 in | Wt 220.0 lb

## 2019-12-05 DIAGNOSIS — S52022A Displaced fracture of olecranon process without intraarticular extension of left ulna, initial encounter for closed fracture: Secondary | ICD-10-CM

## 2019-12-05 NOTE — Progress Notes (Signed)
Post-Op Visit Note   Patient: Roy [name]           Date of Birth: [birth year]           MRN: [id number] Visit Date: 12/05/2019 PCP: [name], MD   Assessment & Plan: Post-ORIF olecranon with pathologic lesion in the olecranon.  Surgical path showed predominantly fibrin with scattered inflammatory cells and bone fragments consistent with cystic degeneration.  Chief Complaint:  Chief Complaint  Patient presents with  . Left Elbow - Routine Post Op    11/24/2019 biopsy, ORIF left olecranon fracture  . Left Leg - Wound Check   Visit Diagnoses:  1. Olecranon fracture, left, closed, initial encounter     Plan: Patient had trouble with constipation since the surgery.  He can pick him an enema at the pharmacy.  He can stop his fibrin with scattered inflammatory cells and bone fragments consistent with cystic degeneration.  X-rays look good return 1 week for staple removal from his elbow and also likely from his leg.  Pain medication.  Leg laceration staples are intact.  Incision over the left groin looks good.  We reviewed the path report which showed  Follow-Up Instructions: No follow-ups on file.   Orders:  Orders Placed This Encounter  Procedures  . XR Elbow 2 Views Left   No orders of the defined types were placed in this encounter.   Imaging: No results found.  PMFS History: Patient Active Problem List   Diagnosis Date Noted  . Elbow fracture, left 11/24/2019  . Olecranon fracture, left, closed, initial encounter 11/21/2019  . Leg laceration, left, initial encounter 11/21/2019  . Unilateral primary osteoarthritis, left hip 06/29/2019  . Osteomyelitis of knee region (HCC) 08/03/2012  . 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 Arthritis   . Drug addict (HCC)    Clean for 20 years  . Psoriatic arthritis (HCC)   . Seasonal allergies     No family history on file.  Past Surgical History:  Procedure  Laterality Date  . EXCISIONAL TOTAL KNEE ARTHROPLASTY WITH ANTIBIOTIC SPACERS Right 07/07/2012   Procedure: EXCISIONAL TOTAL KNEE ARTHROPLASTY WITH ANTIBIOTIC SPACERS;  Surgeon: Valeria Batman, MD;  Location: [facility] OR;  Service: Orthopedics;  Laterality: Right;  Irrigation and Debridement Right Total Knee Replacement, Insertion of ABX Spacer  . KNEE ARTHROSCOPY Right 07/04/2012   Procedure: ARTHROSCOPY I&D KNEE;  Surgeon: Nadara Mustard, MD;  Location: [facility] OR;  Service: Orthopedics;  Laterality: Right;  . KNEE SURGERY    . ORIF ELBOW FRACTURE Left 11/24/2019   Procedure: BIOPSY, OPEN REDUCTION INTERNAL FIXATION (ORIF) LEFT ELBOW/OLECRANON FRACTURE;  Surgeon: Eldred Manges, MD;  Location: [facility] OR;  Service: Orthopedics;  Laterality: Left;  . ORIF TIBIA FRACTURE    . TOTAL HIP ARTHROPLASTY    . TOTAL KNEE ARTHROPLASTY Right 09/12/2012   redo from 2002 sugery  . TOTAL KNEE REVISION Right 09/13/2012   Procedure: TOTAL KNEE REVISION;  Surgeon: Valeria Batman, MD;  Location: [facility] OR;  Service: Orthopedics;  Laterality: Right;  Revision Arthroplasty Right Knee   Social History   Occupational History  . Not on file  Tobacco Use  . Smoking status: Former Smoker    Types: Cigarettes    Quit date: 07/05/1992    Years since quitting: 27.4  . Smokeless tobacco: Never Used  Vaping Use  . Vaping Use: Never used  Substance and Sexual Activity  . Alcohol  use: No    Comment: recovering addict  . Drug use: No    Comment: recovering drug addict 20 years clean  . Sexual activity: Not on file

## 2019-12-12 ENCOUNTER — Encounter: Payer: Self-pay | Admitting: Orthopaedic Surgery

## 2019-12-12 ENCOUNTER — Ambulatory Visit (INDEPENDENT_AMBULATORY_CARE_PROVIDER_SITE_OTHER): Payer: Medicare Other | Admitting: Orthopaedic Surgery

## 2019-12-12 ENCOUNTER — Other Ambulatory Visit: Payer: Self-pay

## 2019-12-12 VITALS — Ht 66.0 in | Wt 220.0 lb

## 2019-12-12 DIAGNOSIS — S42402A Unspecified fracture of lower end of left humerus, initial encounter for closed fracture: Secondary | ICD-10-CM

## 2019-12-12 NOTE — Progress Notes (Signed)
Post-Op Visit Note   Patient: Roy [name]           Date of Birth: 12/07/1955           MRN: [id number] Visit Date: 12/12/2019 PCP: [name], MD   Assessment & Plan: Post olecranon fracture fixation with pathologic fracture with cyst present negative for malignancy.  He can get his hand to his mouth staples are harvested incision looks good he has psoriatic arthritis and this may have been the cyst interosseous from the elbow joint.  Chief Complaint:  Chief Complaint  Patient presents with  . Left Leg - Wound Check  . Left Elbow - Wound Check    11/24/2019 biopsy,ORIF left olecranon fracture   Visit Diagnoses:  1. Closed fracture of left elbow, initial encounter     Plan: Staples out continue to work on range of motion he lacks 30 degrees reaching extension and is doing exercises on his own for extension and flexion.  Repeat x-rays on return olecranon left AP and lateral.  Follow-Up Instructions: Return in about 1 month (around 01/12/2020).   Orders:  No orders of the defined types were placed in this encounter.  No orders of the defined types were placed in this encounter.   Imaging: No results found.  PMFS History: Patient Active Problem List   Diagnosis Date Noted  . Elbow fracture, left 11/24/2019  . Olecranon fracture, left, closed, initial encounter 11/21/2019  . Leg laceration, left, initial encounter 11/21/2019  . Unilateral primary osteoarthritis, left hip 06/29/2019  . Osteomyelitis of knee region (HCC) 08/03/2012  . 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 Arthritis   . Drug addict (HCC)    Clean for 20 years  . Psoriatic arthritis (HCC)   . Seasonal allergies     No family history on file.  Past Surgical History:  Procedure Laterality Date  . EXCISIONAL TOTAL KNEE ARTHROPLASTY WITH ANTIBIOTIC SPACERS Right 07/07/2012   Procedure: EXCISIONAL TOTAL KNEE ARTHROPLASTY WITH ANTIBIOTIC  SPACERS;  Surgeon: Valeria Batman, MD;  Location: [facility] OR;  Service: Orthopedics;  Laterality: Right;  Irrigation and Debridement Right Total Knee Replacement, Insertion of ABX Spacer  . KNEE ARTHROSCOPY Right 07/04/2012   Procedure: ARTHROSCOPY I&D KNEE;  Surgeon: Nadara Mustard, MD;  Location: [facility] OR;  Service: Orthopedics;  Laterality: Right;  . KNEE SURGERY    . ORIF ELBOW FRACTURE Left 11/24/2019   Procedure: BIOPSY, OPEN REDUCTION INTERNAL FIXATION (ORIF) LEFT ELBOW/OLECRANON FRACTURE;  Surgeon: Eldred Manges, MD;  Location: [facility] OR;  Service: Orthopedics;  Laterality: Left;  . ORIF TIBIA FRACTURE    . TOTAL HIP ARTHROPLASTY    . TOTAL KNEE ARTHROPLASTY Right 09/12/2012   redo from 2002 sugery  . TOTAL KNEE REVISION Right 09/13/2012   Procedure: TOTAL KNEE REVISION;  Surgeon: Valeria Batman, MD;  Location: [facility] OR;  Service: Orthopedics;  Laterality: Right;  Revision Arthroplasty Right Knee   Social History   Occupational History  . Not on file  Tobacco Use  . Smoking status: Former Smoker    Types: Cigarettes    Quit date: 07/05/1992    Years since quitting: 27.4  . Smokeless tobacco: Never Used  Vaping Use  . Vaping Use: Never used  Substance and Sexual Activity  . Alcohol use: No    Comment: recovering addict  . Drug use: No    Comment: recovering drug addict 20 years clean  .  Sexual activity: Not on file

## 2020-01-12 ENCOUNTER — Other Ambulatory Visit: Payer: Self-pay

## 2020-01-12 ENCOUNTER — Ambulatory Visit (INDEPENDENT_AMBULATORY_CARE_PROVIDER_SITE_OTHER): Payer: Medicare Other

## 2020-01-12 ENCOUNTER — Encounter: Payer: Self-pay | Admitting: Orthopaedic Surgery

## 2020-01-12 ENCOUNTER — Ambulatory Visit (INDEPENDENT_AMBULATORY_CARE_PROVIDER_SITE_OTHER): Payer: Medicare Other | Admitting: Orthopaedic Surgery

## 2020-01-12 VITALS — BP 154/91 | HR 86 | Ht 66.0 in | Wt 210.0 lb

## 2020-01-12 DIAGNOSIS — S42402A Unspecified fracture of lower end of left humerus, initial encounter for closed fracture: Secondary | ICD-10-CM

## 2020-01-12 NOTE — Progress Notes (Signed)
   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  Olecranon fracture appears healed on x-ray at 6 weeks.  He has regained his range of motion he can gradually work on strengthening starting with light weights.  He can follow-up with me on an as-needed basis.  Chief Complaint:  Chief Complaint  Patient presents with  . Left Elbow - Follow-up    11/24/2019 biopsy, ORIF left olecranon fracture   Visit Diagnoses:  1. Closed fracture of left elbow, initial encounter     Plan: ROV  PRN  Follow-Up Instructions: No follow-ups on file.   Orders:  Orders Placed This Encounter  Procedures  . XR Elbow 2 Views Left   No orders of the defined types were placed in this encounter.   Imaging: No results found.  PMFS History: Patient Active Problem List   Diagnosis Date Noted  . Elbow fracture, left 11/24/2019  . Olecranon fracture, left, closed, initial encounter 11/21/2019  . Leg laceration, left, initial encounter 11/21/2019  . Unilateral primary osteoarthritis, left hip 06/29/2019  . Osteomyelitis of knee region (HCC) 08/03/2012  . 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 Arthritis   . Drug addict (HCC)    Clean for 20 years  . Psoriatic arthritis (HCC)   . Seasonal allergies     No family history on file.  Past Surgical History:  Procedure Laterality Date  . EXCISIONAL TOTAL KNEE ARTHROPLASTY WITH ANTIBIOTIC SPACERS Right 07/07/2012   Procedure: EXCISIONAL TOTAL KNEE ARTHROPLASTY WITH ANTIBIOTIC SPACERS;  Surgeon: Valeria Batman, MD;  Location: [facility] OR;  Service: Orthopedics;  Laterality: Right;  Irrigation and Debridement Right Total Knee Replacement, Insertion of ABX Spacer  . KNEE ARTHROSCOPY Right  07/04/2012   Procedure: ARTHROSCOPY I&D KNEE;  Surgeon: Nadara Mustard, MD;  Location: [facility] OR;  Service: Orthopedics;  Laterality: Right;  . KNEE SURGERY    . ORIF ELBOW FRACTURE Left 11/24/2019   Procedure: BIOPSY, OPEN REDUCTION INTERNAL FIXATION (ORIF) LEFT ELBOW/OLECRANON FRACTURE;  Surgeon: Eldred Manges, MD;  Location: [facility] OR;  Service: Orthopedics;  Laterality: Left;  . ORIF TIBIA FRACTURE    . TOTAL HIP ARTHROPLASTY    . TOTAL KNEE ARTHROPLASTY Right 09/12/2012   redo from 2002 sugery  . TOTAL KNEE REVISION Right 09/13/2012   Procedure: TOTAL KNEE REVISION;  Surgeon: Valeria Batman, MD;  Location: [facility] OR;  Service: Orthopedics;  Laterality: Right;  Revision Arthroplasty Right Knee   Social History   Occupational History  . Not on file  Tobacco Use  . Smoking status: Former Smoker    Types: Cigarettes    Quit date: 07/05/1992    Years since quitting: 27.5  . Smokeless tobacco: Never Used  Vaping Use  . Vaping Use: Never used  Substance and Sexual Activity  . Alcohol use: No    Comment: recovering addict  . Drug use: No    Comment: recovering drug addict 20 years clean  . Sexual activity: Not on file

## 2020-04-04 ENCOUNTER — Telehealth: Payer: Self-pay | Admitting: Physical Medicine and Rehabilitation

## 2020-04-04 NOTE — Telephone Encounter (Signed)
Left message #1

## 2020-04-04 NOTE — Telephone Encounter (Signed)
Scheduled for 2/22

## 2020-04-04 NOTE — Telephone Encounter (Signed)
Ok, he has f/up with [facility] for other things over the past 6 months

## 2020-04-04 NOTE — Telephone Encounter (Signed)
Left hip injection 09/12/19. Ok to repeat if helped, same problem/side, and no new injury?

## 2020-04-04 NOTE — Telephone Encounter (Signed)
Patient called requesting a call back to set appt. Please call patient at [phone].

## 2020-04-16 ENCOUNTER — Ambulatory Visit (INDEPENDENT_AMBULATORY_CARE_PROVIDER_SITE_OTHER): Payer: Medicare Other | Admitting: Physical Medicine and Rehabilitation

## 2020-04-16 ENCOUNTER — Ambulatory Visit: Payer: Self-pay

## 2020-04-16 ENCOUNTER — Encounter: Payer: Self-pay | Admitting: Physical Medicine and Rehabilitation

## 2020-04-16 ENCOUNTER — Other Ambulatory Visit: Payer: Self-pay

## 2020-04-16 DIAGNOSIS — M25552 Pain in left hip: Secondary | ICD-10-CM | POA: Diagnosis not present

## 2020-04-16 NOTE — Progress Notes (Deleted)
Pt state   Numeric Pain Rating Scale and Functional Assessment Average Pain {NUMBERS; 0-10:5044}   In the last MONTH (on 0-10 scale) has pain interfered with the following?  1. General activity like being  able to carry out your everyday physical activities such as walking, climbing stairs, carrying groceries, or moving a chair?  Rating({NUMBERS; 0-10:5044})   +Driver, -BT, -Dye Allergies.  

## 2020-04-16 NOTE — Progress Notes (Signed)
Here for repeat left hip injection. Last injection helped. Pain started to return in January. Left hip/ groin pain. Numeric Pain Rating Scale and Functional Assessment Average Pain 6   In the last MONTH (on 0-10 scale) has pain interfered with the following?  1. General activity like being  able to carry out your everyday physical activities such as walking, climbing stairs, carrying groceries, or moving a chair?  Rating(10)    -Dye Allergies.

## 2020-04-16 NOTE — Progress Notes (Signed)
   Roy [name] - 65 y.o. male MRN [id number]  Date of birth: [birth year]  Office Visit Note: Visit Date: 04/16/2020 PCP: [name], MD Referred by: [name], MD  Subjective: Chief Complaint  Patient presents with  . Left Hip - Pain   HPI:  Roy [name] is a 65 y.o. male who comes in today for planned repeat Left anesthetic hip arthrogram with fluoroscopic guidance.  The patient has failed conservative care including home exercise, medications, time and activity modification. Prior injection gave more than 50% relief for several months. This injection will be diagnostic and hopefully therapeutic.  Please see requesting physician notes for further details and justification.  Referring: Dr. [name]   ROS Otherwise per HPI.  Assessment & Plan: Visit Diagnoses:    ICD-10-CM   1. Pain in left hip  M25.552 XR C-ARM NO REPORT    Large Joint Inj: L hip joint    Plan: No additional findings.   Meds & Orders: No orders of the defined types were placed in this encounter.   Orders Placed This Encounter  Procedures  . Large Joint Inj: L hip joint  . XR C-ARM NO REPORT    Follow-up: Return if symptoms worsen or fail to improve.   Procedures: Large Joint Inj: L hip joint on 04/16/2020 2:00 PM Indications: diagnostic evaluation and pain Details: 22 G 3.5 in needle, fluoroscopy-guided anterior approach  Arthrogram: No  Medications: 4 mL bupivacaine 0.25 %; 60 mg triamcinolone acetonide 40 MG/ML Outcome: tolerated well, no immediate complications  There was excellent flow of contrast producing a partial arthrogram of the hip. The patient did have relief of symptoms during the anesthetic phase of the injection. Procedure, treatment alternatives, risks and benefits explained, specific risks discussed. Consent was given by the patient. Immediately prior to procedure a time out was called to verify the correct patient, procedure, equipment, support staff and site/side  marked as required. Patient was prepped and draped in the usual sterile fashion.          Clinical History: No specialty comments available.     Objective:  VS:  HT:    WT:   BMI:     BP:   HR: bpm  TEMP: ( )  RESP:  Physical Exam   Imaging: No results found.

## 2020-05-27 MED ORDER — BUPIVACAINE HCL 0.25 % IJ SOLN
4.0000 mL | INTRAMUSCULAR | Status: AC | PRN
  Administered 2020-04-16: 4 mL via INTRA_ARTICULAR

## 2020-05-27 MED ORDER — TRIAMCINOLONE ACETONIDE 40 MG/ML IJ SUSP
60.0000 mg | INTRAMUSCULAR | Status: AC | PRN
  Administered 2020-04-16: 60 mg via INTRA_ARTICULAR

## 2020-06-28 ENCOUNTER — Telehealth: Payer: Self-pay

## 2020-06-28 NOTE — Telephone Encounter (Signed)
Pt called and would like to set up and appt with Dr. Alvester Morin

## 2020-07-01 NOTE — Telephone Encounter (Signed)
Pt called want another left hip inj. Pt last hip inj on 04/16/20. Okay to repeat.

## 2020-07-01 NOTE — Telephone Encounter (Signed)
ok 

## 2020-07-01 NOTE — Telephone Encounter (Signed)
Called pt and sch 5/11 

## 2020-07-03 ENCOUNTER — Ambulatory Visit: Payer: Self-pay

## 2020-07-03 ENCOUNTER — Ambulatory Visit (INDEPENDENT_AMBULATORY_CARE_PROVIDER_SITE_OTHER): Payer: Medicare Other | Admitting: Physical Medicine and Rehabilitation

## 2020-07-03 ENCOUNTER — Encounter: Payer: Self-pay | Admitting: Physical Medicine and Rehabilitation

## 2020-07-03 ENCOUNTER — Other Ambulatory Visit: Payer: Self-pay

## 2020-07-03 DIAGNOSIS — M1612 Unilateral primary osteoarthritis, left hip: Secondary | ICD-10-CM

## 2020-07-03 DIAGNOSIS — M25552 Pain in left hip: Secondary | ICD-10-CM | POA: Diagnosis not present

## 2020-07-03 MED ORDER — BUPIVACAINE HCL 0.25 % IJ SOLN
4.0000 mL | INTRAMUSCULAR | Status: AC | PRN
  Administered 2020-07-03: 4 mL via INTRA_ARTICULAR

## 2020-07-03 MED ORDER — TRIAMCINOLONE ACETONIDE 40 MG/ML IJ SUSP
60.0000 mg | INTRAMUSCULAR | Status: AC | PRN
Start: 2020-07-03 — End: 2020-07-03
  Administered 2020-07-03: 60 mg via INTRA_ARTICULAR

## 2020-07-03 NOTE — Progress Notes (Signed)
   Roy [name] - 65 y.o. male MRN [id number]  Date of birth: 12/08/1955  Office Visit Note: Visit Date: 07/03/2020 PCP: [name], MD Referred by: [name], MD  Subjective: Chief Complaint  Patient presents with  . Left Hip - Pain   HPI:  Roy [name] is a 65 y.o. male who comes in today for planned repeat Left anesthetic hip arthrogram with fluoroscopic guidance.  The patient has failed conservative care including home exercise, medications, time and activity modification. Prior injection gave more than 50% relief for several months. This injection will be diagnostic and hopefully therapeutic.  Please see requesting physician notes for further details and justification. End-stage left hip OA.  Referring: Dr. [name]   ROS Otherwise per HPI.  Assessment & Plan: Visit Diagnoses:    ICD-10-CM   1. Pain in left hip  M25.552 Large Joint Inj: L hip joint    XR C-ARM NO REPORT  2. Unilateral primary osteoarthritis, left hip  M16.12     Plan: No additional findings.   Meds & Orders: No orders of the defined types were placed in this encounter.   Orders Placed This Encounter  Procedures  . Large Joint Inj: L hip joint  . XR C-ARM NO REPORT    Follow-up: Return for visit to requesting physician as needed.   Procedures: Large Joint Inj: L hip joint on 07/03/2020 3:00 PM Indications: diagnostic evaluation and pain Details: 22 G 3.5 in needle, fluoroscopy-guided anterior approach  Arthrogram: No  Medications: 4 mL bupivacaine 0.25 %; 60 mg triamcinolone acetonide 40 MG/ML Outcome: tolerated well, no immediate complications  There was excellent flow of contrast producing a partial arthrogram of the hip. The patient did have relief of symptoms during the anesthetic phase of the injection. Procedure, treatment alternatives, risks and benefits explained, specific risks discussed. Consent was given by the patient. Immediately prior to procedure a time out was  called to verify the correct patient, procedure, equipment, support staff and site/side marked as required. Patient was prepped and draped in the usual sterile fashion.          Clinical History: No specialty comments available.     Objective:  VS:  HT:    WT:   BMI:     BP:   HR: bpm  TEMP: ( )  RESP:  Physical Exam   Imaging: No results found.

## 2020-07-03 NOTE — Progress Notes (Signed)
Numeric Pain Rating Scale and Functional Assessment Average Pain 3   In the last MONTH (on 0-10 scale) has pain interfered with the following?  1. General activity like being  able to carry out your everyday physical activities such as walking, climbing stairs, carrying groceries, or moving a chair?  Rating(7)  Patient states left hip hurts when its cold and wet outside. Left hip just aches and hurts when walking and sitting. Last injection did help.

## 2020-09-26 ENCOUNTER — Telehealth: Payer: Self-pay

## 2020-09-26 NOTE — Telephone Encounter (Signed)
Pt would like to sch an appt to see Dr. Alvester Morin. Please advise

## 2020-09-26 NOTE — Telephone Encounter (Signed)
Left hip injection 5/11. Ok to repeat if helped, same problem/side, and no new injury?

## 2020-09-27 NOTE — Telephone Encounter (Signed)
Scheduled for 8/17.

## 2020-10-07 ENCOUNTER — Other Ambulatory Visit (HOSPITAL_BASED_OUTPATIENT_CLINIC_OR_DEPARTMENT_OTHER): Payer: Self-pay

## 2020-10-07 ENCOUNTER — Other Ambulatory Visit: Payer: Self-pay

## 2020-10-07 ENCOUNTER — Ambulatory Visit: Payer: Medicare Other | Attending: Internal Medicine

## 2020-10-07 DIAGNOSIS — Z23 Encounter for immunization: Secondary | ICD-10-CM

## 2020-10-07 MED ORDER — PFIZER-BIONT COVID-19 VAC-TRIS 30 MCG/0.3ML IM SUSP
INTRAMUSCULAR | 0 refills | Status: DC
  Filled 2020-10-07: qty 0.3, 1d supply, fill #0

## 2020-10-07 NOTE — Progress Notes (Signed)
   Covid-19 Vaccination Clinic  Name:  Roy [name]    MRN: [id number] DOB: 1955-12-18  10/07/2020  Mr. Roy [name] was observed post Covid-19 immunization for 15 minutes without incident. He was provided with Vaccine Information Sheet and instruction to access the V-Safe system.   Mr. Roy [name] was instructed to call 911 with any severe reactions post vaccine: Difficulty breathing  Swelling of face and throat  A fast heartbeat  A bad rash all over body  Dizziness and weakness   Immunizations Administered     Name Date Dose VIS Date Route   PFIZER Comrnaty(Gray TOP) Covid-19 Vaccine 10/07/2020 12:44 PM 0.3 mL 02/01/2020 Intramuscular   Manufacturer: [employer], [employer]   Lot: [id number]   NDC: [phone]

## 2020-10-09 ENCOUNTER — Encounter: Payer: Self-pay | Admitting: Physical Medicine and Rehabilitation

## 2020-10-09 ENCOUNTER — Ambulatory Visit: Payer: Self-pay

## 2020-10-09 ENCOUNTER — Ambulatory Visit (INDEPENDENT_AMBULATORY_CARE_PROVIDER_SITE_OTHER): Payer: Medicare Other | Admitting: Physical Medicine and Rehabilitation

## 2020-10-09 ENCOUNTER — Other Ambulatory Visit: Payer: Self-pay

## 2020-10-09 DIAGNOSIS — M25552 Pain in left hip: Secondary | ICD-10-CM | POA: Diagnosis not present

## 2020-10-09 DIAGNOSIS — M1612 Unilateral primary osteoarthritis, left hip: Secondary | ICD-10-CM

## 2020-10-09 NOTE — Progress Notes (Signed)
   Roy [name] - 65 y.o. male MRN [id number]  Date of birth: 07/26/1955  Office Visit Note: Visit Date: 10/09/2020 PCP: [name], MD Referred by: [name], MD  Subjective: Chief Complaint  Patient presents with   Left Hip - Pain   HPI:  Roy [name] is a 65 y.o. male who comes in today at the request of Dr. [name] for planned Left anesthetic hip arthrogram with fluoroscopic guidance.  The patient has failed conservative care including home exercise, medications, time and activity modification.  This injection will be diagnostic and hopefully therapeutic.  Please see requesting physician notes for further details and justification.  Patient with prior right total hip arthroplasty and knee arthroplasty.  Pretty significant hip arthritis now at this point.  Last injection was a couple months ago.  We will repeat today hoping for little bit longer relief but he will need to follow-up with Dr. Cleophas Dunker.   ROS Otherwise per HPI.  Assessment & Plan: Visit Diagnoses:    ICD-10-CM   1. Pain in left hip  M25.552 Large Joint Inj: L hip joint    XR C-ARM NO REPORT    2. Unilateral primary osteoarthritis, left hip  M16.12 Large Joint Inj: L hip joint    XR C-ARM NO REPORT      Plan: No additional findings.   Meds & Orders: No orders of the defined types were placed in this encounter.   Orders Placed This Encounter  Procedures   Large Joint Inj: L hip joint   XR C-ARM NO REPORT    Follow-up: No follow-ups on file.   Procedures: Large Joint Inj: L hip joint on 10/09/2020 1:42 PM Indications: diagnostic evaluation and pain Details: 22 G 3.5 in needle, fluoroscopy-guided anterior approach  Arthrogram: No  Medications: 4 mL bupivacaine 0.25 %; 60 mg triamcinolone acetonide 40 MG/ML Outcome: tolerated well, no immediate complications  There was excellent flow of contrast producing a partial arthrogram of the hip. The patient did have relief of symptoms  during the anesthetic phase of the injection. Procedure, treatment alternatives, risks and benefits explained, specific risks discussed. Consent was given by the patient. Immediately prior to procedure a time out was called to verify the correct patient, procedure, equipment, support staff and site/side marked as required. Patient was prepped and draped in the usual sterile fashion.         Clinical History: No specialty comments available.     Objective:  VS:  HT:    WT:   BMI:     BP:   HR: bpm  TEMP: ( )  RESP:  Physical Exam   Imaging: No results found.

## 2020-10-09 NOTE — Progress Notes (Signed)
Pt state left hip pain. Pt state walking, standing and sitting makes the pain worse. Pt state he takes over the counter pain meds to help ease his pain.  Numeric Pain Rating Scale and Functional Assessment Average Pain 4   In the last MONTH (on 0-10 scale) has pain interfered with the following?  1. General activity like being  able to carry out your everyday physical activities such as walking, climbing stairs, carrying groceries, or moving a chair?  Rating(8)    -BT, -Dye Allergies.

## 2020-10-26 IMAGING — RF DG C-ARM 1-60 MIN
1 series · 2 of 2 positions shown · non-contrast
Comparison: 11/18/2019

CLINICAL DATA: Displaced olecranon fracture

EXAM:
LEFT ELBOW - 2 VIEW; DG C-ARM 1-60 MIN
Fluoroscopy: 26 seconds, 0.88 mGy

[Series 1: unknown protocol · 0.14mm/px · 2 of 2 slices shown]
[im 1/2]
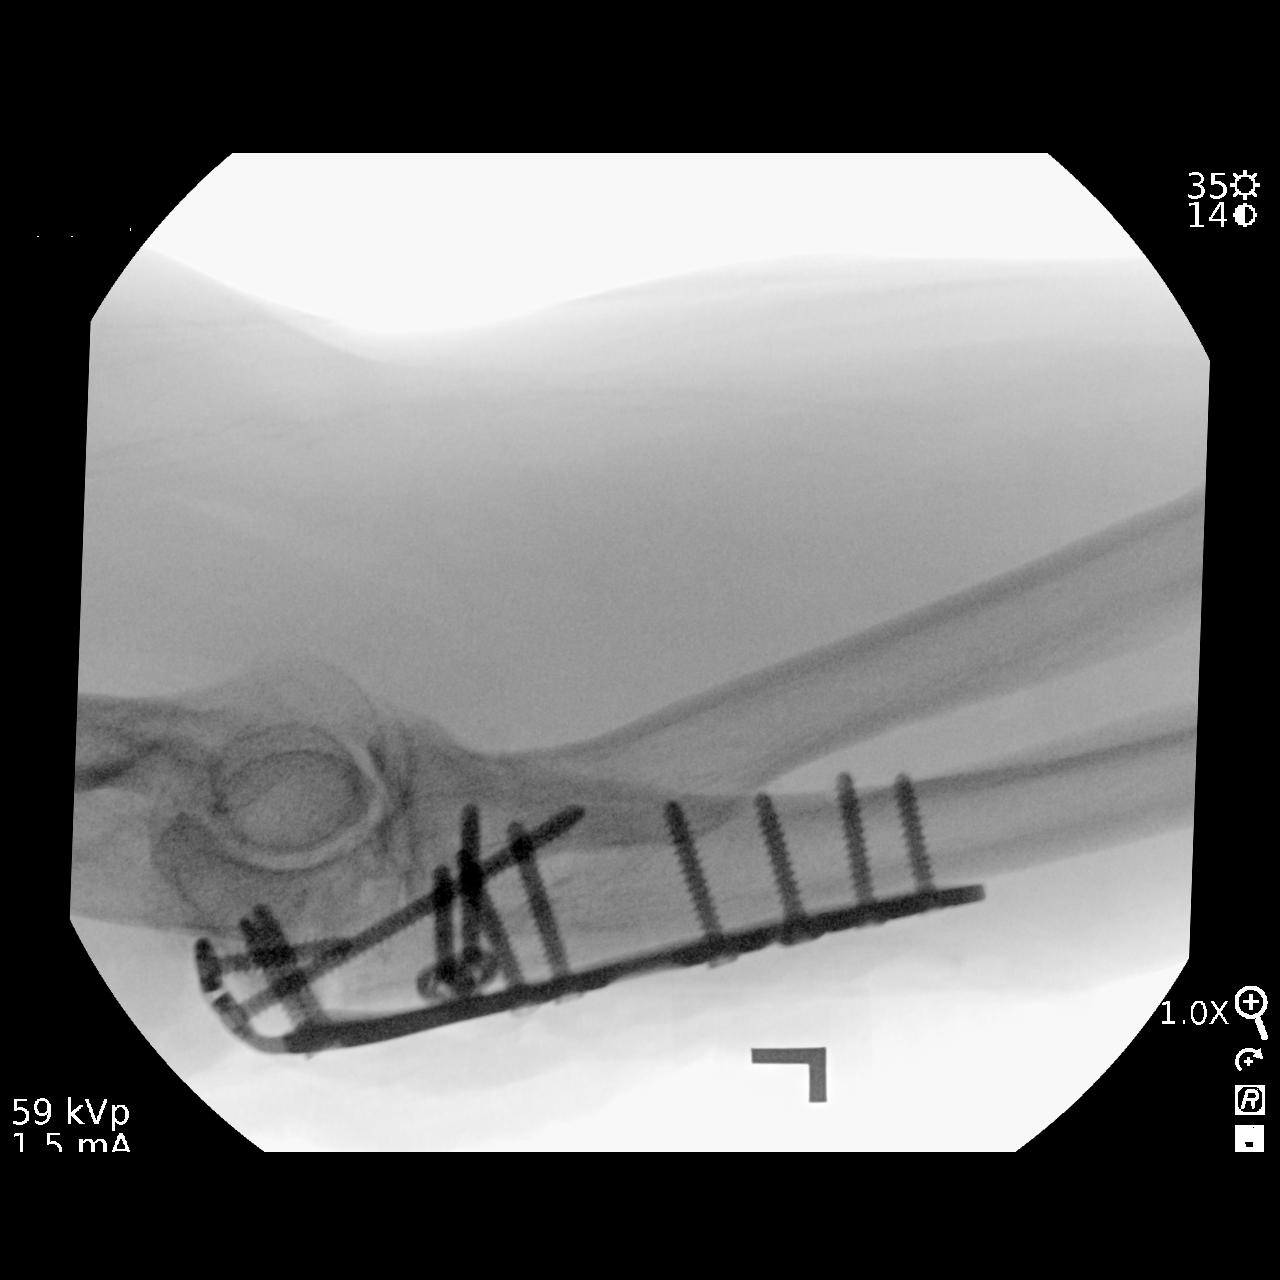
[im 2/2]
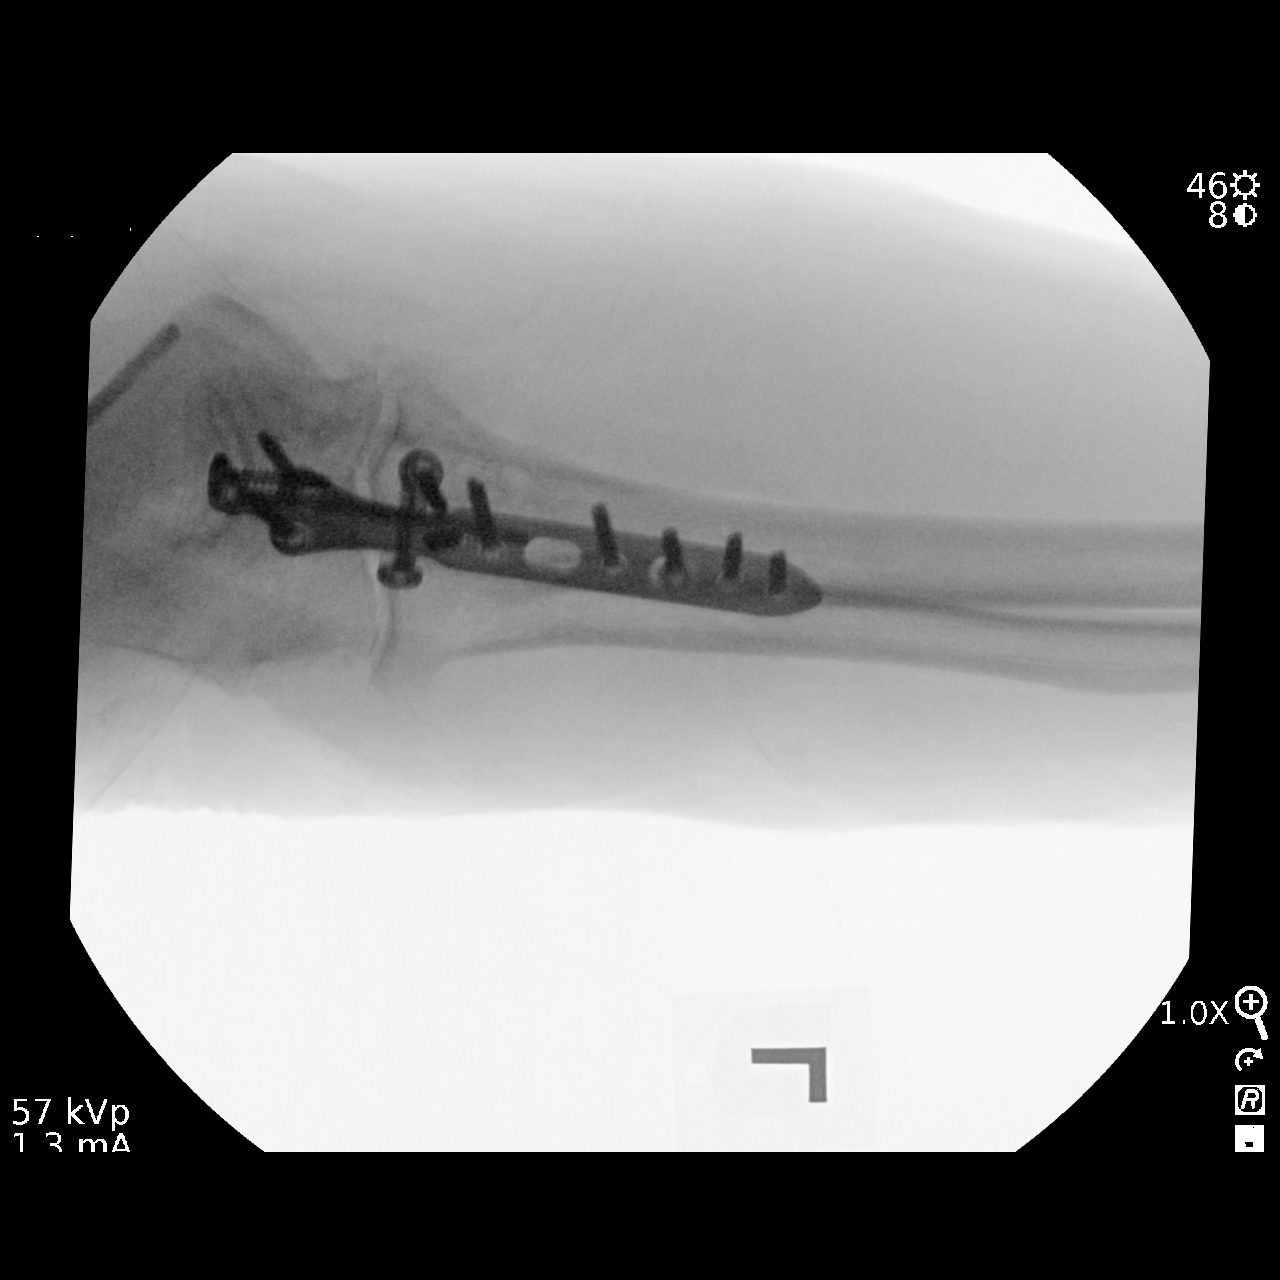

[2 of 2 positions shown; findings below may reference images not displayed]

FINDINGS: Limited spot fluoroscopic views demonstrate plate and screw fixation
of the olecranon fracture with anatomic alignment. No complicating
feature. Left elbow joint aligned.

Scalloped appearing lytic/lucent lesion of the olecranon region at
the fracture site is better demonstrated by plain radiography.
IMPRESSION: Status post ORIF of the left elbow olecranon fracture with anatomic
alignment.

Indeterminate olecranon lytic/lucent lesion

## 2020-10-26 IMAGING — RF DG ELBOW 2V*L*
1 series · 2 of 2 positions shown · non-contrast
Comparison: 11/18/2019

CLINICAL DATA: Displaced olecranon fracture

EXAM:
LEFT ELBOW - 2 VIEW; DG C-ARM 1-60 MIN
Fluoroscopy: 26 seconds, 0.88 mGy

[Series 1: unknown protocol · 0.14mm/px · 2 of 2 slices shown]
[im 1/2]
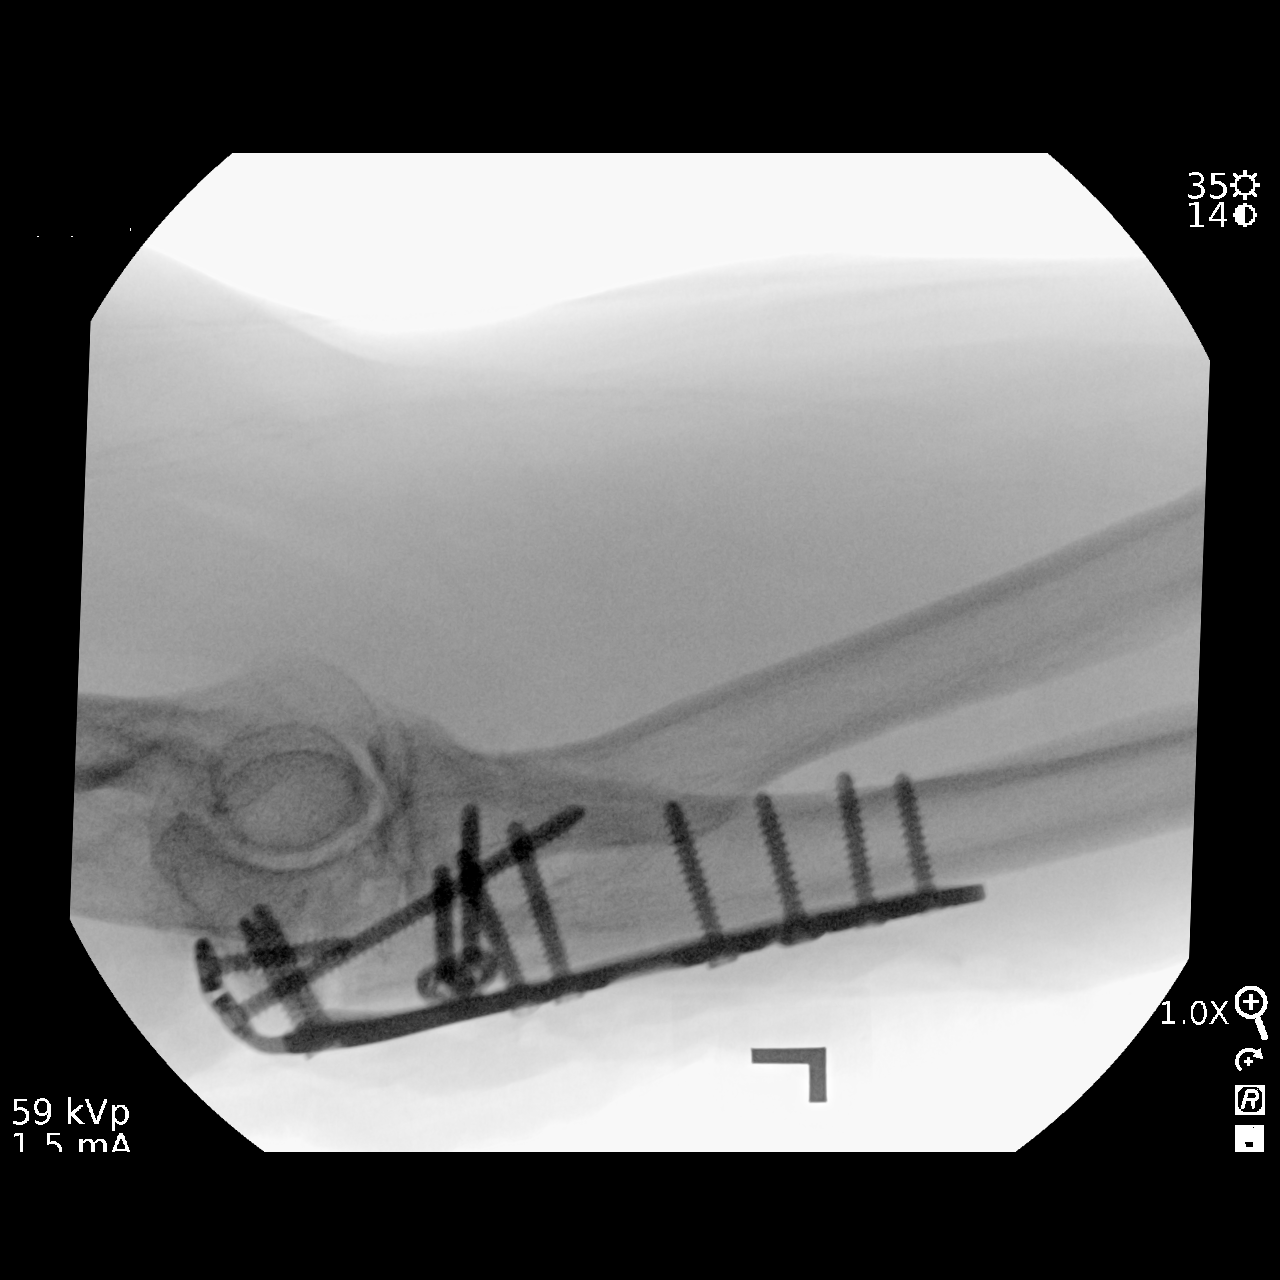
[im 2/2]
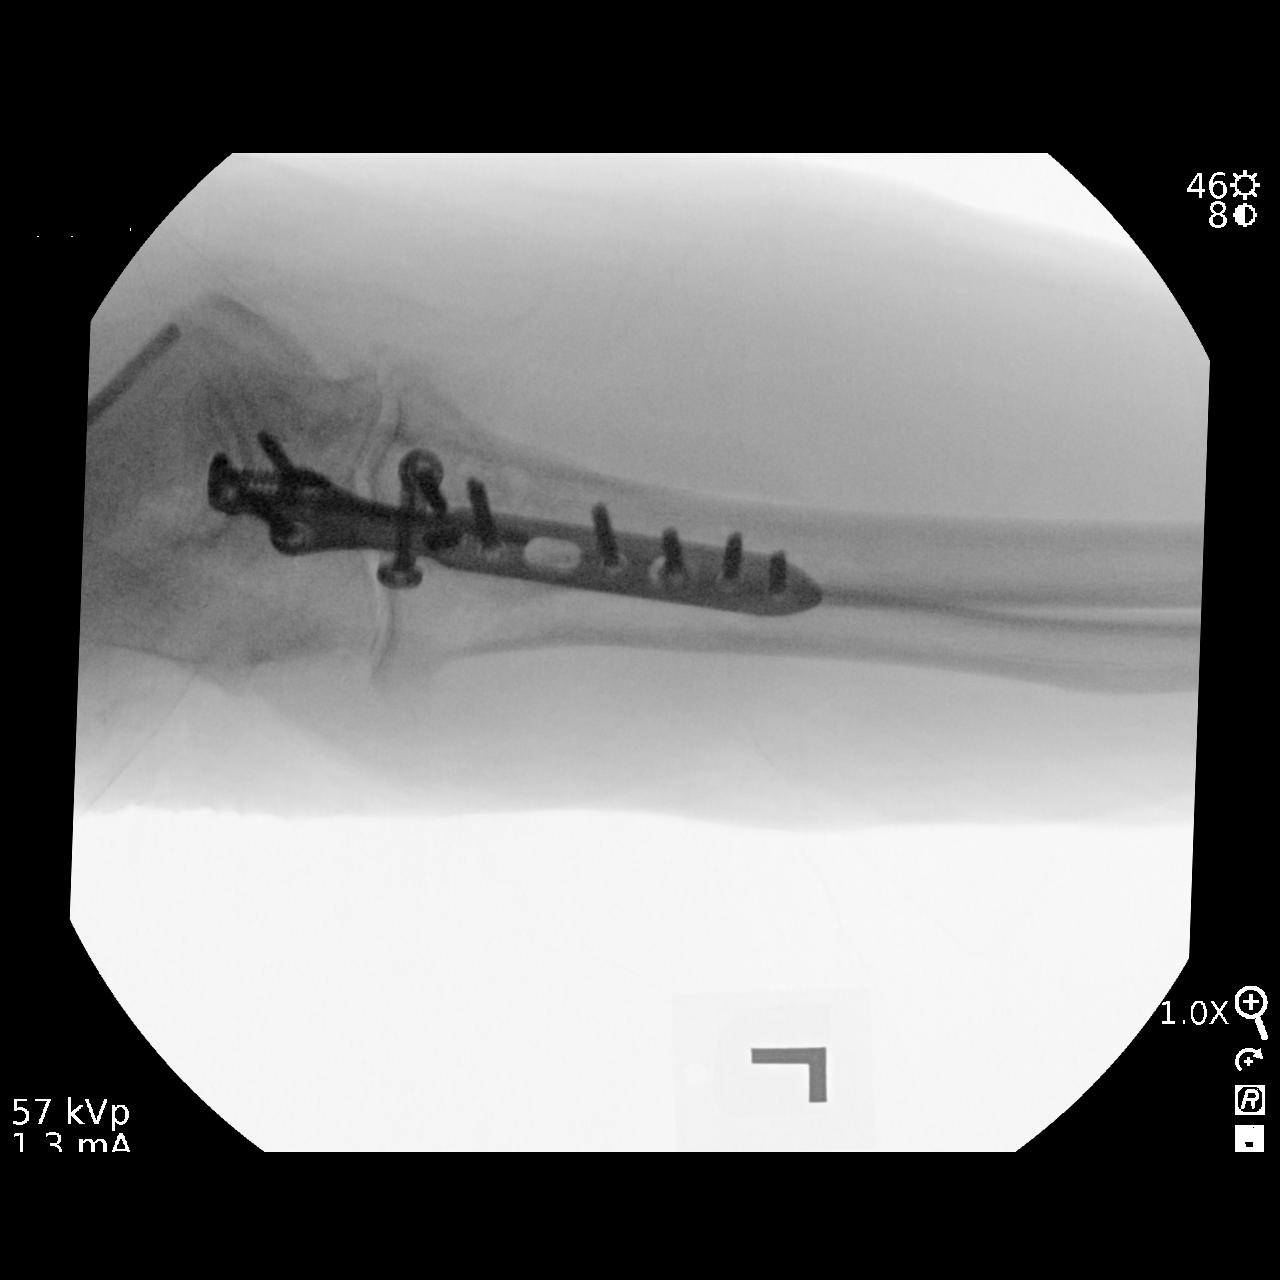

[2 of 2 positions shown; findings below may reference images not displayed]

FINDINGS: Limited spot fluoroscopic views demonstrate plate and screw fixation
of the olecranon fracture with anatomic alignment. No complicating
feature. Left elbow joint aligned.

Scalloped appearing lytic/lucent lesion of the olecranon region at
the fracture site is better demonstrated by plain radiography.
IMPRESSION: Status post ORIF of the left elbow olecranon fracture with anatomic
alignment.

Indeterminate olecranon lytic/lucent lesion

## 2020-10-29 MED ORDER — BUPIVACAINE HCL 0.25 % IJ SOLN
4.0000 mL | INTRAMUSCULAR | Status: AC | PRN
  Administered 2020-10-09: 4 mL via INTRA_ARTICULAR

## 2020-10-29 MED ORDER — TRIAMCINOLONE ACETONIDE 40 MG/ML IJ SUSP
60.0000 mg | INTRAMUSCULAR | Status: AC | PRN
  Administered 2020-10-09: 60 mg via INTRA_ARTICULAR

## 2021-01-02 ENCOUNTER — Other Ambulatory Visit: Payer: Self-pay

## 2021-01-02 ENCOUNTER — Ambulatory Visit: Payer: Medicare Other | Attending: Internal Medicine

## 2021-01-02 ENCOUNTER — Other Ambulatory Visit (HOSPITAL_BASED_OUTPATIENT_CLINIC_OR_DEPARTMENT_OTHER): Payer: Self-pay

## 2021-01-02 DIAGNOSIS — Z23 Encounter for immunization: Secondary | ICD-10-CM

## 2021-01-02 MED ORDER — PFIZER COVID-19 VAC BIVALENT 30 MCG/0.3ML IM SUSP
INTRAMUSCULAR | 0 refills | Status: DC
  Filled 2021-01-02: qty 0.3, 1d supply, fill #0

## 2021-01-02 MED ORDER — INFLUENZA VAC A&B SA ADJ QUAD 0.5 ML IM PRSY
PREFILLED_SYRINGE | INTRAMUSCULAR | 0 refills | Status: DC
  Filled 2021-01-02: qty 0.5, 1d supply, fill #0

## 2021-01-02 NOTE — Progress Notes (Signed)
   Covid-19 Vaccination Clinic  Name:  Roy [name]    MRN: [id number] DOB: [birth year]  01/02/2021  [name] was observed post Covid-19 immunization for 15 minutes without incident. He was provided with Vaccine Information Sheet and instruction to access the V-Safe system.   [name] was instructed to call 911 with any severe reactions post vaccine: Difficulty breathing  Swelling of face and throat  A fast heartbeat  A bad rash all over body  Dizziness and weakness   Immunizations Administered     Name Date Dose VIS Date Route   Pfizer Covid-19 Vaccine Bivalent Booster 01/02/2021 12:55 PM 0.3 mL 10/23/2020 Intramuscular   Manufacturer: [employer], [employer]   Lot: [id number]   NDC: [phone]

## 2021-01-31 ENCOUNTER — Telehealth: Payer: Self-pay | Admitting: Physical Medicine and Rehabilitation

## 2021-01-31 NOTE — Telephone Encounter (Signed)
Pt calling to get an appt sch'd for an inj. The best call back number is [phone].

## 2021-02-13 ENCOUNTER — Ambulatory Visit (INDEPENDENT_AMBULATORY_CARE_PROVIDER_SITE_OTHER): Payer: Medicare Other | Admitting: Physical Medicine and Rehabilitation

## 2021-02-13 ENCOUNTER — Other Ambulatory Visit: Payer: Self-pay

## 2021-02-13 ENCOUNTER — Encounter: Payer: Self-pay | Admitting: Physical Medicine and Rehabilitation

## 2021-02-13 ENCOUNTER — Ambulatory Visit: Payer: Self-pay

## 2021-02-13 DIAGNOSIS — M25552 Pain in left hip: Secondary | ICD-10-CM

## 2021-02-13 NOTE — Progress Notes (Signed)
° °  Roy [name] - 65 y.o. male MRN [id number]  Date of birth: September 25, 1955  Office Visit Note: Visit Date: 02/13/2021 PCP: [name], MD Referred by: [name], MD  Subjective: Chief Complaint  Patient presents with   Left Hip - Pain   HPI:  Roy [name] is a 65 y.o. male who comes in today for planned repeat Left anesthetic hip arthrogram with fluoroscopic guidance.  The patient has failed conservative care including home exercise, medications, time and activity modification. Prior injection gave more than 50% relief for several months. This injection will be diagnostic and hopefully therapeutic.  Please see requesting physician notes for further details and justification.  Referring: Dr. [name]   ROS Otherwise per HPI.  Assessment & Plan: Visit Diagnoses:    ICD-10-CM   1. Pain in left hip  M25.552 XR C-ARM NO REPORT    Large Joint Inj: L hip joint      Plan: No additional findings.   Meds & Orders: No orders of the defined types were placed in this encounter.   Orders Placed This Encounter  Procedures   Large Joint Inj: L hip joint   XR C-ARM NO REPORT    Follow-up: Return for visit to requesting provider as needed.   Procedures: Large Joint Inj: L hip joint on 02/13/2021 9:48 AM Indications: diagnostic evaluation and pain Details: 22 G 3.5 in needle, fluoroscopy-guided anterior approach  Arthrogram: No  Medications: 4 mL bupivacaine 0.25 %; 60 mg triamcinolone acetonide 40 MG/ML Outcome: tolerated well, no immediate complications  There was excellent flow of contrast producing a partial arthrogram of the hip. The patient did have relief of symptoms during the anesthetic phase of the injection. Procedure, treatment alternatives, risks and benefits explained, specific risks discussed. Consent was given by the patient. Immediately prior to procedure a time out was called to verify the correct patient, procedure, equipment, support staff and  site/side marked as required. Patient was prepped and draped in the usual sterile fashion.         Clinical History: No specialty comments available.     Objective:  VS:  HT:     WT:    BMI:      BP:    HR: bpm   TEMP: ( )   RESP:  Physical Exam   Imaging: XR C-ARM NO REPORT  Result Date: 02/13/2021 Please see Notes tab for imaging impression.

## 2021-02-13 NOTE — Progress Notes (Signed)
Pt state left hip pain. Pt state walking, standing and laying down makes the pain worse. Pt state he takes pain meds to help ease his pain.   Numeric Pain Rating Scale and Functional Assessment Average Pain 3   In the last MONTH (on 0-10 scale) has pain interfered with the following?  1. General activity like being  able to carry out your everyday physical activities such as walking, climbing stairs, carrying groceries, or moving a chair?  Rating(8)   +Driver, -BT, -Dye Allergies.

## 2021-02-14 MED ORDER — TRIAMCINOLONE ACETONIDE 40 MG/ML IJ SUSP
60.0000 mg | INTRAMUSCULAR | Status: AC | PRN
  Administered 2021-02-13: 10:00:00 60 mg via INTRA_ARTICULAR

## 2021-02-14 MED ORDER — BUPIVACAINE HCL 0.25 % IJ SOLN
4.0000 mL | INTRAMUSCULAR | Status: AC | PRN
Start: 2021-02-13 — End: 2021-02-13
  Administered 2021-02-13: 10:00:00 4 mL via INTRA_ARTICULAR

## 2021-05-07 ENCOUNTER — Telehealth: Payer: Self-pay

## 2021-05-07 NOTE — Telephone Encounter (Signed)
Patient called and would like to make a repeat apt with Dr. Alvester Morin. Please advise  ?

## 2021-05-29 ENCOUNTER — Ambulatory Visit: Payer: Medicare Other | Admitting: Physical Medicine and Rehabilitation

## 2021-06-19 ENCOUNTER — Ambulatory Visit: Payer: Self-pay

## 2021-06-19 ENCOUNTER — Ambulatory Visit (INDEPENDENT_AMBULATORY_CARE_PROVIDER_SITE_OTHER): Payer: Medicare PPO | Admitting: Physical Medicine and Rehabilitation

## 2021-06-19 ENCOUNTER — Encounter: Payer: Self-pay | Admitting: Physical Medicine and Rehabilitation

## 2021-06-19 DIAGNOSIS — M25552 Pain in left hip: Secondary | ICD-10-CM | POA: Diagnosis not present

## 2021-06-19 MED ORDER — TRIAMCINOLONE ACETONIDE 40 MG/ML IJ SUSP
60.0000 mg | INTRAMUSCULAR | Status: AC | PRN
  Administered 2021-06-19: 60 mg via INTRA_ARTICULAR

## 2021-06-19 MED ORDER — BUPIVACAINE HCL 0.25 % IJ SOLN
4.0000 mL | INTRAMUSCULAR | Status: AC | PRN
  Administered 2021-06-19: 4 mL via INTRA_ARTICULAR

## 2021-06-19 NOTE — Progress Notes (Signed)
? ?  Roy [name] - 66 y.o. male MRN [id number]  Date of birth: [birth year] ? ?Office Visit Note: ?Visit Date: 06/19/2021 ?PCP: Roy Banner, MD ?Referred by: Roy Banner, MD ? ?Subjective: ?Chief Complaint  ?[name] presents with  ? Left Hip - Pain  ? ?HPI:  Roy [name] is a 66 y.o. male who comes in today for planned repeat Left anesthetic hip arthrogram with fluoroscopic guidance.  Roy [name] has failed conservative care including home exercise, medications, time and activity modification. Prior injection gave more than 50% relief for several months. This injection will be diagnostic and hopefully therapeutic.  Please see requesting physician notes for further details and justification. ? ?Referring: Dr. Norlene [name]  ? ?ROS Otherwise per HPI. ? ?Assessment & Plan: ?Visit Diagnoses:  ?  ICD-10-CM   ?1. Pain in left hip  M25.552 XR C-ARM NO REPORT  ?  Large Joint Inj: L hip joint  ?  ?  ?Plan: No additional findings.  ? ?Meds & Orders: No orders of Roy defined types were placed in this encounter. ?  ?Orders Placed This Encounter  ?Procedures  ? Large Joint Inj: L hip joint  ? XR C-ARM NO REPORT  ?  ?Follow-up: No follow-ups on file.  ? ?Procedures: ?Large Joint Inj: L hip joint on 06/19/2021 8:12 AM ?Indications: diagnostic evaluation and pain ?Details: 22 G 3.5 in needle, fluoroscopy-guided anterior approach ? ?Arthrogram: No ? ?Medications: 4 mL bupivacaine 0.25 %; 60 mg triamcinolone acetonide 40 MG/ML ?Outcome: tolerated well, no immediate complications ? ?There was excellent flow of contrast producing a partial arthrogram of Roy hip. Roy [name] did have relief of symptoms during Roy anesthetic phase of Roy injection. ?Procedure, treatment alternatives, risks and benefits explained, specific risks discussed. Consent was given by Roy [name]. Immediately prior to procedure a time out was called to verify Roy correct [name], procedure, equipment, support staff and site/side marked as required.  [name] was prepped and draped in Roy usual sterile fashion.  ? ?  ?   ? ?Clinical History: ?No specialty comments available.  ? ? ? ?Objective:  VS:  HT:    WT:   BMI:     BP:   HR: bpm  TEMP: ( )  RESP:  ?Physical Exam  ? ?Imaging: ?No results found. ?

## 2021-06-19 NOTE — Progress Notes (Signed)
Pt state left hip pain. Pt state standing and sitting makes the pain worse. Pt state he takes pain meds to help ease his pain. ? ?Numeric Pain Rating Scale and Functional Assessment ?Average Pain 4 ? ? ?In the last MONTH (on 0-10 scale) has pain interfered with the following? ? ?1. General activity like being  able to carry out your everyday physical activities such as walking, climbing stairs, carrying groceries, or moving a chair?  ?Rating(8) ? ? ? -BT, -Dye Allergies. ? ?

## 2021-09-05 ENCOUNTER — Telehealth: Payer: Self-pay | Admitting: Physical Medicine and Rehabilitation

## 2021-09-05 NOTE — Telephone Encounter (Signed)
Roy [name] is calling to set up an appt with [facility] for an injection in his left hip. Please advise.

## 2021-09-08 ENCOUNTER — Ambulatory Visit: Payer: Self-pay

## 2021-09-08 ENCOUNTER — Ambulatory Visit (INDEPENDENT_AMBULATORY_CARE_PROVIDER_SITE_OTHER): Payer: Medicare PPO | Admitting: Physical Medicine and Rehabilitation

## 2021-09-08 ENCOUNTER — Encounter: Payer: Self-pay | Admitting: Physical Medicine and Rehabilitation

## 2021-09-08 DIAGNOSIS — M25552 Pain in left hip: Secondary | ICD-10-CM

## 2021-09-08 MED ORDER — BUPIVACAINE HCL 0.25 % IJ SOLN
4.0000 mL | INTRAMUSCULAR | Status: AC | PRN
  Administered 2021-09-08: 4 mL via INTRA_ARTICULAR

## 2021-09-08 MED ORDER — TRIAMCINOLONE ACETONIDE 40 MG/ML IJ SUSP
60.0000 mg | INTRAMUSCULAR | Status: AC | PRN
  Administered 2021-09-08: 60 mg via INTRA_ARTICULAR

## 2021-09-08 NOTE — Progress Notes (Signed)
Pt state left hip pain. Pt state standing and sitting makes the pain worse. Pt state he takes pain meds to help ease his pain.  Numeric Pain Rating Scale and Functional Assessment Average Pain 4   In the last MONTH (on 0-10 scale) has pain interfered with the following?  1. General activity like being  able to carry out your everyday physical activities such as walking, climbing stairs, carrying groceries, or moving a chair?  Rating(9)   -BT, -Dye Allergies.

## 2021-09-08 NOTE — Progress Notes (Signed)
   Roy [name] - 66 y.o. male MRN [id number]  Date of birth: [birth year]  Office Visit Note: Visit Date: 09/08/2021 PCP: [name], MD Referred by: [name], MD  Subjective: Chief Complaint  Patient presents with   Left Hip - Pain   HPI:  Roy [name] is a 66 y.o. male who comes in today for planned repeat Left anesthetic hip arthrogram with fluoroscopic guidance.  The patient has failed conservative care including home exercise, medications, time and activity modification. Prior injection gave more than 50% relief for several months. This injection will be diagnostic and hopefully therapeutic.  Please see requesting physician notes for further details and justification. Will make appointment to speak with Dr. Ophelia Charter or Dr. Cleophas Dunker about replacement.   Referring: Dr. [name]   ROS Otherwise per HPI.  Assessment & Plan: Visit Diagnoses:    ICD-10-CM   1. Pain in left hip  M25.552 Large Joint Inj: L hip joint    XR C-ARM NO REPORT      Plan: No additional findings.   Meds & Orders: No orders of the defined types were placed in this encounter.   Orders Placed This Encounter  Procedures   Large Joint Inj: L hip joint   XR C-ARM NO REPORT    Follow-up: Return if symptoms worsen or fail to improve.   Procedures: Large Joint Inj: L hip joint on 09/08/2021 8:25 AM Indications: diagnostic evaluation and pain Details: 22 G 3.5 in needle, fluoroscopy-guided anterior approach  Arthrogram: No  Medications: 4 mL bupivacaine 0.25 %; 60 mg triamcinolone acetonide 40 MG/ML Outcome: tolerated well, no immediate complications  There was excellent flow of contrast producing a partial arthrogram of the hip. The patient did have relief of symptoms during the anesthetic phase of the injection. Procedure, treatment alternatives, risks and benefits explained, specific risks discussed. Consent was given by the patient. Immediately prior to procedure a time out was  called to verify the correct patient, procedure, equipment, support staff and site/side marked as required. Patient was prepped and draped in the usual sterile fashion.          Clinical History: No specialty comments available.     Objective:  VS:  HT:    WT:   BMI:     BP:   HR: bpm  TEMP: ( )  RESP:  Physical Exam   Imaging: XR C-ARM NO REPORT  Result Date: 09/08/2021 Please see Notes tab for imaging impression.

## 2021-11-21 ENCOUNTER — Telehealth: Payer: Self-pay | Admitting: Physical Medicine and Rehabilitation

## 2021-11-21 NOTE — Telephone Encounter (Signed)
Patient called needing an appointment with Dr. Ernestina Patches for an injection in his left hip. The number to contact patient is [phone]

## 2021-11-21 NOTE — Telephone Encounter (Signed)
Pain in same area. Injection works 80% for 3 months. He states if he has to get them once every 3 months is good. Can he go ahead and repeat.

## 2021-11-21 NOTE — Telephone Encounter (Signed)
I left voicemail for patient requesting return call to let me know if previous injection helped and if so how much.

## 2021-11-27 ENCOUNTER — Ambulatory Visit: Payer: Self-pay

## 2021-11-27 ENCOUNTER — Ambulatory Visit (INDEPENDENT_AMBULATORY_CARE_PROVIDER_SITE_OTHER): Payer: Medicare PPO | Admitting: Physical Medicine and Rehabilitation

## 2021-11-27 DIAGNOSIS — M25552 Pain in left hip: Secondary | ICD-10-CM | POA: Diagnosis not present

## 2021-11-27 NOTE — Progress Notes (Signed)
   Roy [name] - 66 y.o. male MRN [id number]  Date of birth: 1955-09-28  Office Visit Note: Visit Date: 11/27/2021 PCP: Christain Sacramento, MD Referred by: Christain Sacramento, MD  Subjective: Chief Complaint  Patient presents with   Left Hip - Pain   HPI:  Roy [name] is a 65 y.o. male who comes in today for planned repeat Left anesthetic hip arthrogram with fluoroscopic guidance.  The patient has failed conservative care including home exercise, medications, time and activity modification. Prior injection gave more than 50% relief for several months. This injection will be diagnostic and hopefully therapeutic.  Please see requesting physician notes for further details and justification.  Referring: Dr. Jean Rosenthal   ROS Otherwise per HPI.  Assessment & Plan: Visit Diagnoses:    ICD-10-CM   1. Pain in left hip  M25.552 XR C-ARM NO REPORT    Large Joint Inj: L hip joint      Plan: No additional findings.   Meds & Orders: No orders of the defined types were placed in this encounter.   Orders Placed This Encounter  Procedures   Large Joint Inj: L hip joint   XR C-ARM NO REPORT    Follow-up: No follow-ups on file.   Procedures: Large Joint Inj: L hip joint on 11/27/2021 8:18 AM Indications: diagnostic evaluation and pain Details: 22 G 3.5 in needle, fluoroscopy-guided anterior approach  Arthrogram: No  Medications: 4 mL bupivacaine 0.25 %; 40 mg triamcinolone acetonide 40 MG/ML Outcome: tolerated well, no immediate complications  There was excellent flow of contrast producing a partial arthrogram of the hip. The patient did have relief of symptoms during the anesthetic phase of the injection. Procedure, treatment alternatives, risks and benefits explained, specific risks discussed. Consent was given by the patient. Immediately prior to procedure a time out was called to verify the correct patient, procedure, equipment, support staff and site/side marked as  required. Patient was prepped and draped in the usual sterile fashion.          Clinical History: No specialty comments available.     Objective:  VS:  HT:    WT:   BMI:     BP:   HR: bpm  TEMP: ( )  RESP:  Physical Exam   Imaging: No results found.

## 2021-12-08 MED ORDER — BUPIVACAINE HCL 0.25 % IJ SOLN
4.0000 mL | INTRAMUSCULAR | Status: AC | PRN
  Administered 2021-11-27: 4 mL via INTRA_ARTICULAR

## 2021-12-08 MED ORDER — TRIAMCINOLONE ACETONIDE 40 MG/ML IJ SUSP
40.0000 mg | INTRAMUSCULAR | Status: AC | PRN
  Administered 2021-11-27: 40 mg via INTRA_ARTICULAR

## 2022-01-22 ENCOUNTER — Telehealth: Payer: Self-pay | Admitting: Physical Medicine and Rehabilitation

## 2022-01-22 NOTE — Telephone Encounter (Signed)
Patient called needing to schedule an appointment with Dr. Alvester Morin. The number to contact patient is [phone]

## 2022-01-23 NOTE — Telephone Encounter (Addendum)
Patient is calling to schedule left hip injection. He states the pain is the same. The last injection lasted until this week from 11/2021. Please advise.

## 2022-01-26 ENCOUNTER — Other Ambulatory Visit: Payer: Self-pay | Admitting: Physical Medicine and Rehabilitation

## 2022-01-26 DIAGNOSIS — M25552 Pain in left hip: Secondary | ICD-10-CM

## 2022-02-02 ENCOUNTER — Telehealth: Payer: Self-pay | Admitting: Physical Medicine and Rehabilitation

## 2022-02-02 NOTE — Telephone Encounter (Signed)
Spoke with patient and scheduled injection for 02/04/22

## 2022-02-02 NOTE — Telephone Encounter (Signed)
Patient called. Returning a call to schedule with Dr. Alvester Morin. His call back number is [phone]

## 2022-02-03 ENCOUNTER — Ambulatory Visit (INDEPENDENT_AMBULATORY_CARE_PROVIDER_SITE_OTHER): Payer: Medicare PPO | Admitting: Orthopaedic Surgery

## 2022-02-03 ENCOUNTER — Ambulatory Visit (INDEPENDENT_AMBULATORY_CARE_PROVIDER_SITE_OTHER): Payer: Medicare PPO

## 2022-02-03 VITALS — BP 139/93 | HR 85 | Ht 65.5 in | Wt 234.4 lb

## 2022-02-03 DIAGNOSIS — M25552 Pain in left hip: Secondary | ICD-10-CM | POA: Diagnosis not present

## 2022-02-03 DIAGNOSIS — Z96641 Presence of right artificial hip joint: Secondary | ICD-10-CM | POA: Diagnosis not present

## 2022-02-03 DIAGNOSIS — M1612 Unilateral primary osteoarthritis, left hip: Secondary | ICD-10-CM

## 2022-02-03 NOTE — Progress Notes (Addendum)
Office Visit Note   Patient: Roy [name]           Date of Birth: 10-Apr-1955           MRN: [id number] Visit Date: 02/03/2022              Requested by: [name], MD [address] [place],  [place] [address] PCP: [name], MD   Assessment & Plan: Visit Diagnoses:  1. Pain in left hip   2. Unilateral primary osteoarthritis, left hip   3. History of total hip arthroplasty, right            With severe poly wear after 21 years  Plan: Patient might proceed with left total hip arthroplasty we discussed spinal anesthesia direct anterior approach risks of surgery bleeding infection complications.  Use of a walker postop.  He will require probably revision on his opposite right hip coming send we reviewed x-rays and discussed this with him.  He only has about a millimeter left before he starts having metal-on-metal wear and then metallosis and bone osteolysis.  Will proceed with left total of arthroplasty.  Plan would be spinal anesthesia.  Questions elicited and answered he understands agrees to proceed.  Follow-Up Instructions: No follow-ups on file.   Orders:  Orders Placed This Encounter  Procedures   XR HIP UNILAT W OR W/O PELVIS 2-3 VIEWS LEFT   No orders of the defined types were placed in this encounter.     Procedures: No procedures performed   Clinical Data: No additional findings.   Subjective: Chief Complaint  Patient presents with   Left Hip - Pain    HPI 66 year old male had right total hip arthroplasty done by Dr. Cleophas Dunker 21 years ago without any problems with his right hip.  Left hip is progressively painful to the point where he is using a cane he has had intra-articular injections without relief.  He is trying to lose BMI 38 states his hip but hurts so bad he is not really able to exercise or walk much.  Has problems sleeping pain wakes him up at night.  Pain is in his left groin and radiates to his mid thigh.  Previous history of  right total knee arthroplasty requiring later revision done at [facility] possibly for infection.  Right knee is done well since that time.  Patient is here and states he wants to proceed with left total hip arthroplasty.  Patient uses a cane has been on ibuprofen without relief as well as intra-articular hip injection which only gave him temporary relief.  Review of Systems positive for previous right total hip arthroplasty right knee replacement later revision.  History olecranon fracture with ORIF olecranon by me 2021.  Negative for stroke chest pain heart attack.  All systems noncontributory.   Objective: Vital Signs: BP (!) 139/93 (BP Location: Left Arm, Patient Position: Sitting)   Pulse 85   Ht 5' 5.5" (1.664 m)   Wt 234 lb 6.4 oz (106.3 kg)   BMI 38.41 kg/m   Physical Exam Constitutional:      Appearance: He is well-developed.  HENT:     Head: Normocephalic and atraumatic.     Right Ear: External ear normal.     Left Ear: External ear normal.  Eyes:     Pupils: Pupils are equal, round, and reactive to light.  Neck:     Thyroid: No thyromegaly.     Trachea: No tracheal deviation.  Cardiovascular:     Rate and Rhythm: Normal rate.  Pulmonary:     Effort: Pulmonary effort is normal.     Breath sounds: No wheezing.  Abdominal:     General: Bowel sounds are normal.     Palpations: Abdomen is soft.  Musculoskeletal:     Cervical back: Neck supple.  Skin:    General: Skin is warm and dry.     Capillary Refill: Capillary refill takes less than 2 seconds.  Neurological:     Mental Status: He is alert and oriented to person, place, and time.  Psychiatric:        Behavior: Behavior normal.        Thought Content: Thought content normal.        Judgment: Judgment normal.     Ortho Exam patient has 5 degrees internal rotation left hip with severe groin pain.  10 degree flexion contracture. Rotation only 20 degrees with pain.  Poor  bilateral knee range of  motion with arthrofibrosis healed revision TKA scar, pedal pulses are normal right total knee arthroplasty midline incision is well-healed.  Specialty Comments:  No specialty comments available.  Imaging: No results found.   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  Past Surgical History:  Procedure Laterality Date   EXCISIONAL TOTAL KNEE ARTHROPLASTY WITH ANTIBIOTIC SPACERS Right 07/07/2012   Procedure: EXCISIONAL TOTAL KNEE ARTHROPLASTY WITH ANTIBIOTIC SPACERS;  Surgeon: Valeria Batman, MD;  Location: [facility] OR;  Service: Orthopedics;  Laterality: Right;  Irrigation and Debridement Right Total Knee Replacement, Insertion of ABX Spacer   KNEE ARTHROSCOPY Right 07/04/2012   Procedure: ARTHROSCOPY I&D KNEE;  Surgeon: Nadara Mustard, MD;  Location: [facility] OR;  Service: Orthopedics;  Laterality: Right;   KNEE SURGERY     ORIF ELBOW FRACTURE Left 11/24/2019   Procedure: BIOPSY, OPEN REDUCTION INTERNAL FIXATION (ORIF) LEFT ELBOW/OLECRANON FRACTURE;  Surgeon: Eldred Manges, MD;  Location: [facility] OR;  Service: Orthopedics;  Laterality: Left;   ORIF TIBIA FRACTURE     TOTAL HIP ARTHROPLASTY     TOTAL KNEE ARTHROPLASTY Right 09/12/2012   redo from 2002 sugery   TOTAL KNEE REVISION Right 09/13/2012   Procedure: TOTAL KNEE REVISION;  Surgeon: Valeria Batman, MD;  Location: [facility] OR;  Service: Orthopedics;  Laterality: Right;  Revision  Arthroplasty Right Knee   Social History   Occupational History   Not on file  Tobacco Use   Smoking status: Former    Types: Cigarettes    Quit date: 07/05/1992    Years since quitting: 29.6   Smokeless tobacco: Never  Vaping Use   Vaping Use: Never used  Substance and Sexual Activity   Alcohol use: No    Comment: recovering addict   Drug use: No    Comment: recovering drug addict 20 years clean   Sexual activity: Not on file

## 2022-02-04 ENCOUNTER — Ambulatory Visit: Payer: PRIVATE HEALTH INSURANCE | Admitting: Physical Medicine and Rehabilitation

## 2022-02-06 DIAGNOSIS — Z96641 Presence of right artificial hip joint: Secondary | ICD-10-CM | POA: Insufficient documentation

## 2022-02-11 ENCOUNTER — Other Ambulatory Visit: Payer: Self-pay | Admitting: Physician Assistant

## 2022-02-13 ENCOUNTER — Ambulatory Visit: Payer: PRIVATE HEALTH INSURANCE | Admitting: Orthopaedic Surgery

## 2022-02-18 ENCOUNTER — Other Ambulatory Visit (HOSPITAL_BASED_OUTPATIENT_CLINIC_OR_DEPARTMENT_OTHER): Payer: Self-pay

## 2022-02-18 MED ORDER — COMIRNATY 30 MCG/0.3ML IM SUSY
PREFILLED_SYRINGE | INTRAMUSCULAR | 0 refills | Status: DC
  Filled 2022-02-18: qty 0.3, 1d supply, fill #0

## 2022-02-18 MED ORDER — INFLUENZA VAC A&B SA ADJ QUAD 0.5 ML IM PRSY
PREFILLED_SYRINGE | INTRAMUSCULAR | 0 refills | Status: DC
  Filled 2022-02-18: qty 0.5, 1d supply, fill #0

## 2022-03-03 ENCOUNTER — Other Ambulatory Visit: Payer: Self-pay | Admitting: Physician Assistant

## 2022-03-03 ENCOUNTER — Ambulatory Visit (INDEPENDENT_AMBULATORY_CARE_PROVIDER_SITE_OTHER): Payer: PRIVATE HEALTH INSURANCE | Admitting: Physician Assistant

## 2022-03-03 ENCOUNTER — Encounter: Payer: Self-pay | Admitting: Physician Assistant

## 2022-03-03 VITALS — BP 136/83 | HR 77 | Temp 97.2°F | Ht 65.5 in | Wt 233.6 lb

## 2022-03-03 DIAGNOSIS — M1612 Unilateral primary osteoarthritis, left hip: Secondary | ICD-10-CM

## 2022-03-03 NOTE — Progress Notes (Signed)
Office Visit Note   Patient: Roy [name]           Date of Birth: 07/05/1955           MRN: [id number] Visit Date: 03/03/2022              Requested by: [name], MD [address] [place],  [place] [address] PCP: [name], MD  Chief Complaint  Patient presents with   Left Hip - Pain    Pre op      HPI: Roy [name] is a pleasant 67 year old gentleman with a history of psoriatic arthritis.  He is status post right hip replacement by Dr. Cleophas Dunker many years ago.  He is now presenting for his preoperative history and physical for his upcoming left total hip arthroplasty.  His health has been stable.  He is tolerated surgery in the past no history of blood clots.  No history of anesthesia complications.  He does have nausea and vomiting with OxyContin  Assessment & Plan: Visit Diagnoses:  1. Unilateral primary osteoarthritis, left hip     Plan: Patient will go forward with left total hip arthroplasty.  He is also planning on a poly exchange in his right hip over the summer.  I reviewed the risks of surgery with him which include but are not limited to bleeding infection anesthesia complications need for future surgery.  He does take ibuprofen and I have asked that he stop taking this for 5 days before surgery.  Will do his preop tomorrow.  H&P dictated into the hospital system  Follow-Up Instructions: Return 1 week post op.   Ortho Exam  Patient is alert, oriented, no adenopathy, well-dressed, normal affect, normal respiratory effort. Patient appears well he does have psoriatic arthritis but does not have any lesions over the anterior aspect of his hip  Imaging: No results found. No images are attached to the encounter.  Labs: Lab Results  Component Value Date   ESRSEDRATE 7 09/02/2012   ESRSEDRATE 46 (H) 07/09/2012   ESRSEDRATE 20 (H) 07/04/2012   CRP 1.5 (H) 09/02/2012   CRP 7.7 (H) 07/09/2012   CRP 17.0 (H) 07/04/2012   REPTSTATUS 09/16/2012 FINAL  09/13/2012   REPTSTATUS 09/18/2012 FINAL 09/13/2012   GRAMSTAIN NO WBC SEEN NO ORGANISMS SEEN 09/13/2012   GRAMSTAIN NO WBC SEEN NO ORGANISMS SEEN 09/13/2012   CULT NO GROWTH 3 DAYS 09/13/2012   CULT NO ANAEROBES ISOLATED 09/13/2012   LABORGA STAPHYLOCOCCUS AUREUS 07/07/2012     Lab Results  Component Value Date   ALBUMIN 3.4 (L) 11/24/2019   ALBUMIN 4.0 09/09/2012   ALBUMIN 2.3 (L) 07/06/2012    No results found for: "MG" No results found for: "VD25OH"  No results found for: "PREALBUMIN"    Latest Ref Rng & Units 11/25/2019    6:16 AM 11/24/2019    6:41 AM 09/16/2012    5:40 AM  CBC EXTENDED  WBC 4.0 - 10.5 K/uL 11.5  7.8  8.6   RBC 4.22 - 5.81 MIL/uL 5.36  5.50  3.83   Hemoglobin 13.0 - 17.0 g/dL 84.1  32.4  40.1   HCT 39.0 - 52.0 % 45.7  47.6  30.4   Platelets 150 - 400 K/uL 242  265  243      Body mass index is 38.28 kg/m.  Orders:  No orders of the defined types were placed in this encounter.  No orders of the defined types were placed in this encounter.  Procedures: No procedures performed  Clinical Data: No additional findings.  ROS:  All other systems negative, except as noted in the HPI. Review of Systems  Objective: Vital Signs: BP 136/83 (BP Location: Left Arm, Patient Position: Sitting, Cuff Size: Large)   Pulse 77   Temp (!) 97.2 F (36.2 C) (Oral)   Ht 5' 5.5" (1.664 m)   Wt 233 lb 9.6 oz (106 kg)   BMI 38.28 kg/m   Specialty Comments:  No specialty comments available.  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 No pertinent family history.  Past Surgical History:  Procedure Laterality Date   EXCISIONAL TOTAL KNEE ARTHROPLASTY WITH ANTIBIOTIC SPACERS Right 07/07/2012   Procedure: EXCISIONAL TOTAL KNEE ARTHROPLASTY WITH ANTIBIOTIC SPACERS;  Surgeon: Garald Balding, MD;  Location: [place];  Service: Orthopedics;  Laterality: Right;  Irrigation and Debridement Right Total Knee Replacement, Insertion of ABX Spacer   KNEE ARTHROSCOPY Right 07/04/2012   Procedure: ARTHROSCOPY I&D KNEE;  Surgeon: Newt Minion, MD;  Location: [place];  Service: Orthopedics;  Laterality: Right;   KNEE SURGERY     ORIF ELBOW FRACTURE Left 11/24/2019   Procedure: BIOPSY, OPEN REDUCTION INTERNAL FIXATION (ORIF) LEFT ELBOW/OLECRANON FRACTURE;  Surgeon: Marybelle Killings, MD;  Location: [place];  Service: Orthopedics;  Laterality: Left;   ORIF TIBIA FRACTURE     TOTAL HIP ARTHROPLASTY     TOTAL KNEE ARTHROPLASTY Right 09/12/2012   redo from 2002 [place] Right 09/13/2012   Procedure: TOTAL KNEE REVISION;  Surgeon: Garald Balding, MD;  Location: [facility];  Service: Orthopedics;  Laterality: Right;  Revision Arthroplasty Right Knee   Social History   Occupational History   Not on file  Tobacco Use   Smoking status: Former    Types: Cigarettes    Quit date: 07/05/1992    Years since quitting: 29.6   Smokeless tobacco: Never  Vaping Use   Vaping Use: Never used  Substance and Sexual Activity   Alcohol use: No    Comment: recovering addict   Drug use: No    Comment: recovering drug addict 20 years clean   Sexual activity: Not on file

## 2022-03-03 NOTE — Pre-Procedure Instructions (Signed)
Surgical Instructions    Your procedure is scheduled on Wednesday, January 17th.  Report to [facility] Main Entrance "A" at 10:30 A.M., then check in with the Admitting office.  Call this number if you have problems the morning of surgery:  [phone]  If you have any questions prior to your surgery date call [phone]: Open Monday-Friday 8am-4pm If you experience any cold or flu symptoms such as cough, fever, chills, shortness of breath, etc. between now and your scheduled surgery, please notify us at the above number.     Remember:  Do not eat after midnight the night before your surgery  You may drink clear liquids until 09:30 AM the morning of your surgery.   Clear liquids allowed are: Water, Non-Citrus Juices (without pulp), Carbonated Beverages, Clear Tea, Black Coffee Only (NO MILK, CREAM OR POWDERED CREAMER of any kind), and Gatorade.   Patient Instructions  The night before surgery:  No food after midnight. ONLY clear liquids after midnight  The day of surgery (if you do NOT have diabetes):  Drink ONE (1) Pre-Surgery Clear Ensure by 09:30 AM the morning of surgery. Drink in one sitting. Do not sip.  This drink was given to you during your hospital  pre-op appointment visit.  Nothing else to drink after completing the  Pre-Surgery Clear Ensure.          If you have questions, please contact your surgeon's office.     Take these medicines the morning of surgery with A SIP OF WATER: none   As of today, STOP taking any Aspirin (unless otherwise instructed by your surgeon) Aleve, Naproxen, Ibuprofen, Motrin, Advil, Goody's, BC's, all herbal medications, fish oil, and all vitamins.                     Do NOT Smoke (Tobacco/Vaping) for 24 hours prior to your procedure.  If you use a CPAP at night, you may bring your mask/headgear for your overnight stay.   Contacts, glasses, piercing's, hearing aid's, dentures or partials may not be worn into surgery, please bring  cases for these belongings.    For patients admitted to the hospital, discharge time will be determined by your treatment team.   Patients discharged the day of surgery will not be allowed to drive home, and someone needs to stay with them for 24 hours.  SURGICAL WAITING ROOM VISITATION Patients having surgery or a procedure may have no more than 2 support people in the waiting area - these visitors may rotate.   Children under the age of 34 must have an adult with them who is not the patient. If the patient needs to stay at the hospital during part of their recovery, the visitor guidelines for inpatient rooms apply. Pre-op nurse will coordinate an appropriate time for 1 support person to accompany patient in pre-op.  This support person may not rotate.   Please refer to the [facility] website for the visitor guidelines for Inpatients (after your surgery is over and you are in a regular room).    Special instructions:   [facility]- Preparing For Surgery  Before surgery, you can play an important role. Because skin is not sterile, your skin needs to be as free of germs as possible. You can reduce the number of germs on your skin by washing with CHG (chlorahexidine gluconate) Soap before surgery.  CHG is an antiseptic cleaner which kills germs and bonds with the skin to continue killing germs even after washing.  Oral Hygiene is also important to reduce your risk of infection.  Remember - BRUSH YOUR TEETH THE MORNING OF SURGERY WITH YOUR REGULAR TOOTHPASTE  Please do not use if you have an allergy to CHG or antibacterial soaps. If your skin becomes reddened/irritated stop using the CHG.  Do not shave (including legs and underarms) for at least 48 hours prior to first CHG shower. It is OK to shave your face.  Please follow these instructions carefully.   Shower the NIGHT BEFORE SURGERY and the MORNING OF SURGERY  If you chose to wash your hair, wash your hair first as usual with your  normal shampoo.  After you shampoo, rinse your hair and body thoroughly to remove the shampoo.  Use CHG Soap as you would any other liquid soap. You can apply CHG directly to the skin and wash gently with a scrungie or a clean washcloth.   Apply the CHG Soap to your body ONLY FROM THE NECK DOWN.  Do not use on open wounds or open sores. Avoid contact with your eyes, ears, mouth and genitals (private parts). Wash Face and genitals (private parts)  with your normal soap.   Wash thoroughly, paying special attention to the area where your surgery will be performed.  Thoroughly rinse your body with warm water from the neck down.  DO NOT shower/wash with your normal soap after using and rinsing off the CHG Soap.  Pat yourself dry with a CLEAN TOWEL.  Wear CLEAN PAJAMAS to bed the night before surgery  Place CLEAN SHEETS on your bed the night before your surgery  DO NOT SLEEP WITH PETS.   Day of Surgery: Take a shower with CHG soap. Do not wear jewelry  Do not wear lotions, powders, colognes, or deodorant.  Men may shave face and neck. Do not bring valuables to the hospital. [facility] is not responsible for any belongings or valuables.  Wear Clean/Comfortable clothing the morning of surgery Remember to brush your teeth WITH YOUR REGULAR TOOTHPASTE.   Please read over the following fact sheets that you were given.    If you received a COVID test during your pre-op visit  it is requested that you wear a mask when out in public, stay away from anyone that may not be feeling well and notify your surgeon if you develop symptoms. If you have been in contact with anyone that has tested positive in the last 10 days please notify you surgeon.

## 2022-03-03 NOTE — H&P (Signed)
TOTAL HIP ADMISSION H&P  Patient is admitted for left total hip arthroplasty.  Subjective:     Left Hip - Pain      HPI 67 year old male had right total hip arthroplasty done by Dr. Durward Fortes 21 years ago without any problems with his right hip.  Left hip is progressively painful to the point where he is using a cane he has had intra-articular injections without relief.  He is trying to lose BMI 38 states his hip but hurts so bad he is not really able to exercise or walk much.  Has problems sleeping pain wakes him up at night.  Pain is in his left groin and radiates to his mid thigh.   Previous history of right total knee arthroplasty requiring later revision done at [facility] possibly for infection.  Right knee is done well since that time.  Patient is here and states he wants to proceed with left total hip arthroplasty.  Patient uses a cane has been on ibuprofen without relief as well as intra-articular hip injection which only gave him temporary relief.int: left hip pain  HPI: Roy [name], 67 y.o. male, has a history of pain and functional disability in the left hip(s) due to arthritis and patient has failed non-surgical conservative treatments for greater than 12 weeks to include NSAID's and/or analgesics, flexibility and strengthening excercises, and use of assistive devices.  Onset of symptoms was gradual starting 8 years ago with gradually worsening course since that time.The patient noted no past surgery on the left hip(s).  Patient currently rates pain in the left hip at 8 out of 10 with activity. Patient has night pain, worsening of pain with activity and weight bearing, and trendelenberg gait. Patient has evidence of subchondral cysts, subchondral sclerosis, and periarticular osteophytes by imaging studies. This condition presents safety issues increasing the risk of falls. This patient has had    .  There is no current active infection.  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   Psoriatic arthritis ([place])    Seasonal allergies     Past Surgical History:  Procedure Laterality Date   EXCISIONAL TOTAL KNEE ARTHROPLASTY WITH ANTIBIOTIC SPACERS Right 07/07/2012   Procedure: EXCISIONAL TOTAL KNEE ARTHROPLASTY WITH ANTIBIOTIC SPACERS;  Surgeon: Garald Balding, MD;  Location: [place];  Service: Orthopedics;  Laterality: Right;  Irrigation and Debridement Right Total Knee Replacement, Insertion of ABX Spacer   KNEE ARTHROSCOPY Right 07/04/2012   Procedure: ARTHROSCOPY I&D KNEE;  Surgeon: Newt Minion, MD;  Location: [place];  Service: Orthopedics;  Laterality: Right;   KNEE SURGERY     ORIF ELBOW FRACTURE Left 11/24/2019   Procedure: BIOPSY, OPEN REDUCTION INTERNAL FIXATION (ORIF) LEFT ELBOW/OLECRANON FRACTURE;  Surgeon: Marybelle Killings, MD;  Location: [place];  Service: Orthopedics;  Laterality: Left;   ORIF TIBIA FRACTURE     TOTAL HIP ARTHROPLASTY     TOTAL KNEE ARTHROPLASTY Right 09/12/2012   redo from 2002 [place] Right 09/13/2012  Procedure: TOTAL KNEE REVISION;  Surgeon: Peter W Whitfield, MD;  Location: [facility];  Service: Orthopedics;  Laterality: Right;  Revision Arthroplasty Right Knee    Current Outpatient Medications  Medication Sig Dispense Refill Last Dose   COVID-19 mRNA bivalent vaccine, Pfizer, (PFIZER COVID-19 VAC BIVALENT) injection Inject into the muscle. 0.3 mL 0    COVID-19 mRNA Vac-TriS, Pfizer, (PFIZER-BIONT COVID-19 VAC-TRIS) SUSP injection Inject  into the muscle. 0.3 mL 0    COVID-19 mRNA vaccine 2023-2024 (COMIRNATY) syringe Inject into the muscle. 0.3 mL 0    fluticasone (FLONASE) 50 MCG/ACT nasal spray Place 2 sprays into both nostrils at bedtime as needed for allergies or rhinitis.      ibuprofen (ADVIL) 200 MG tablet Take 600-800 mg by mouth daily as needed (Arthritis).      influenza vaccine adjuvanted (FLUAD) 0.5 ML injection Inject into the muscle. 0.5 mL 0    influenza vaccine adjuvanted (FLUAD) 0.5 ML injection Inject into the muscle. 0.5 mL 0    melatonin 5 MG TABS Take 15 mg by mouth at bedtime.      Multiple Vitamin (MULTIVITAMIN WITH MINERALS) TABS Take 1 tablet by mouth daily.      No current facility-administered medications for this visit.   Allergies  Allergen Reactions   Demerol [Meperidine] Nausea And Vomiting   Morphine And Related Nausea And Vomiting   Oxycodone     Patient preference (History of drug abuse)    Social History   Tobacco Use   Smoking status: Former    Types: Cigarettes    Quit date: 07/05/1992    Years since quitting: 29.6   Smokeless tobacco: Never  Substance Use Topics   Alcohol use: No    Comment: recovering addict    No family history on file.   Review of Systems  Constitutional:  Positive for activity change.  All other systems reviewed and are negative.   Objective:  Physical Exam  positive for previous right total hip arthroplasty right knee replacement later revision.  History olecranon fracture with ORIF olecranon by me 2021.  Negative for stroke chest pain heart attack.  All systems noncontributory.     Objective: Vital Signs: BP (!) 139/93 (BP Location: Left Arm, Patient Position: Sitting)   Pulse 85   Ht 5' 5.5" (1.664 m)   Wt 234 lb 6.4 oz (106.3 kg)   BMI 38.41 kg/m    Physical Exam Constitutional:      Appearance: He is well-developed.  HENT:     Head: Normocephalic and atraumatic.     Right Ear: External ear normal.     Left Ear: External ear normal.   Eyes:     Pupils: Pupils are equal, round, and reactive to light.  Neck:     Thyroid: No thyromegaly.     Trachea: No tracheal deviation.  Cardiovascular:     Rate and Rhythm: Normal rate.  Pulmonary:     Effort: Pulmonary effort is normal.     Breath sounds: No wheezing.  Abdominal:     General: Bowel sounds are normal.     Palpations: Abdomen is soft.  Musculoskeletal:     Cervical back: Neck supple.  Skin:    General: Skin is warm and dry.     Capillary Refill: Capillary refill takes less than 2 seconds.  Neurological:     Mental Status: He is alert and oriented to person, place, and time.  Psychiatric:        Behavior: Behavior normal.          Thought Content: Thought content normal.        Judgment: Judgment normal.        Ortho Exam patient has 5 degrees internal rotation left hip with severe groin pain.  10 degree flexion contracture. Rotation only 20 degrees with pain.  Good knee range of motion pedal pulses are normal right total knee arthroplasty midline incision is well-healed. Heart RRR Lungs Clear Vital signs in last 24 hours: @VSRANGES @  Labs:   Estimated body mass index is 38.28 kg/m as calculated from the following:   Height as of an earlier encounter on 03/03/22: 5' 5.5" (1.664 m).   Weight as of an earlier encounter on 03/03/22: 106 kg.   Imaging Review Plain radiographs demonstrate severe degenerative joint disease of the left hip(s). The bone quality appears to be good for age and reported activity level.      Assessment/Plan:  End stage arthritis, left hip(s)  The patient history, physical examination, clinical judgement of the provider and imaging studies are consistent with end stage degenerative joint disease of the left hip(s) and total hip arthroplasty is deemed medically necessary. The treatment options including medical management, injection therapy, arthroscopy and arthroplasty were discussed at length. The risks and benefits of total hip  arthroplasty were presented and reviewed. The risks due to aseptic loosening, infection, stiffness, dislocation/subluxation,  thromboembolic complications and other imponderables were discussed.  The patient acknowledged the explanation, agreed to proceed with the plan and consent was signed. Patient is being admitted for inpatient treatment for surgery, pain control, PT, OT, prophylactic antibiotics, VTE prophylaxis, progressive ambulation and ADL's and discharge planning.The patient is planning to be discharged home with home health services Pain in left hip    2. Unilateral primary osteoarthritis, left hip   3. History of total hip arthroplasty, right            With severe poly wear after 21 years   Plan: Patient might proceed with left total hip arthroplasty we discussed spinal anesthesia direct anterior approach risks of surgery bleeding infection complications.  Use of a walker postop.  He will require probably revision on his opposite right hip coming send we reviewed x-rays and discussed this with him.  He only has about a millimeter left before he starts having metal-on-metal wear and then metallosis and bone osteolysis.  Will proceed with left total of arthroplasty.  Plan would be spinal anesthesia.  Questions elicited and answered he understands agrees to proceed.   Patient's anticipated LOS is less than 2 midnights, meeting these requirements: - Younger than 47 - Lives within 1 hour of care - Has a competent adult at home to recover with post-op recover - NO history of  - Chronic pain requiring opiods  - Diabetes  - Coronary Artery Disease  - Heart failure  - Heart attack  - Stroke  - DVT/VTE  - Cardiac arrhythmia  - Respiratory Failure/COPD  - Renal failure  - Anemia  - Advanced Liver disease

## 2022-03-03 NOTE — H&P (View-Only) (Signed)
TOTAL HIP ADMISSION H&P  Patient is admitted for left total hip arthroplasty.  Subjective:     Left Hip - Pain      HPI 67 year old male had right total hip arthroplasty done by Dr. Durward Fortes 21 years ago without any problems with his right hip.  Left hip is progressively painful to the point where he is using a cane he has had intra-articular injections without relief.  He is trying to lose BMI 38 states his hip but hurts so bad he is not really able to exercise or walk much.  Has problems sleeping pain wakes him up at night.  Pain is in his left groin and radiates to his mid thigh.   Previous history of right total knee arthroplasty requiring later revision done at [facility] possibly for infection.  Right knee is done well since that time.  Patient is here and states he wants to proceed with left total hip arthroplasty.  Patient uses a cane has been on ibuprofen without relief as well as intra-articular hip injection which only gave him temporary relief.int: left hip pain  HPI: Roy [name], 67 y.o. male, has a history of pain and functional disability in the left hip(s) due to arthritis and patient has failed non-surgical conservative treatments for greater than 12 weeks to include NSAID's and/or analgesics, flexibility and strengthening excercises, and use of assistive devices.  Onset of symptoms was gradual starting 8 years ago with gradually worsening course since that time.The patient noted no past surgery on the left hip(s).  Patient currently rates pain in the left hip at 8 out of 10 with activity. Patient has night pain, worsening of pain with activity and weight bearing, and trendelenberg gait. Patient has evidence of subchondral cysts, subchondral sclerosis, and periarticular osteophytes by imaging studies. This condition presents safety issues increasing the risk of falls. This patient has had    .  There is no current active infection.  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   Psoriatic arthritis ([place])    Seasonal allergies     Past Surgical History:  Procedure Laterality Date   EXCISIONAL TOTAL KNEE ARTHROPLASTY WITH ANTIBIOTIC SPACERS Right 07/07/2012   Procedure: EXCISIONAL TOTAL KNEE ARTHROPLASTY WITH ANTIBIOTIC SPACERS;  Surgeon: Garald [place], MD;  Location: [place];  Service: Orthopedics;  Laterality: Right;  Irrigation and Debridement Right Total Knee Replacement, Insertion of ABX Spacer   KNEE ARTHROSCOPY Right 07/04/2012   Procedure: ARTHROSCOPY I&D KNEE;  Surgeon: Newt Minion, MD;  Location: [place];  Service: Orthopedics;  Laterality: Right;   KNEE SURGERY     ORIF ELBOW FRACTURE Left 11/24/2019   Procedure: BIOPSY, OPEN REDUCTION INTERNAL FIXATION (ORIF) LEFT ELBOW/OLECRANON FRACTURE;  Surgeon: Marybelle Killings, MD;  Location: [place];  Service: Orthopedics;  Laterality: Left;   ORIF TIBIA FRACTURE     TOTAL HIP ARTHROPLASTY     TOTAL KNEE ARTHROPLASTY Right 09/12/2012   redo from 2002 [place] Right 09/13/2012  Procedure: TOTAL KNEE REVISION;  Surgeon: Garald Balding, MD;  Location: [place];  Service: Orthopedics;  Laterality: Right;  Revision Arthroplasty Right Knee    Current Outpatient Medications  Medication Sig Dispense Refill Last Dose   COVID-19 mRNA bivalent vaccine, Pfizer, (PFIZER COVID-19 VAC BIVALENT) injection Inject into the muscle. 0.3 mL 0    COVID-19 mRNA Vac-TriS, Pfizer, (PFIZER-BIONT COVID-19 VAC-TRIS) SUSP injection Inject  into the muscle. 0.3 mL 0    COVID-19 mRNA vaccine 2023-2024 (COMIRNATY) syringe Inject into the muscle. 0.3 mL 0    fluticasone (FLONASE) 50 MCG/ACT nasal spray Place 2 sprays into both nostrils at bedtime as needed for allergies or rhinitis.      ibuprofen (ADVIL) 200 MG tablet Take 600-800 mg by mouth daily as needed (Arthritis).      influenza vaccine adjuvanted (FLUAD) 0.5 ML injection Inject into the muscle. 0.5 mL 0    influenza vaccine adjuvanted (FLUAD) 0.5 ML injection Inject into the muscle. 0.5 mL 0    melatonin 5 MG TABS Take 15 mg by mouth at bedtime.      Multiple Vitamin (MULTIVITAMIN WITH MINERALS) TABS Take 1 tablet by mouth daily.      No current facility-administered medications for this visit.   Allergies  Allergen Reactions   Demerol [Meperidine] Nausea And Vomiting   Morphine And Related Nausea And Vomiting   Oxycodone     Patient preference (History of drug abuse)    Social History   Tobacco Use   Smoking status: Former    Types: Cigarettes    Quit date: 07/05/1992    Years since quitting: 29.6   Smokeless tobacco: Never  Substance Use Topics   Alcohol use: No    Comment: recovering addict    No family history on file.   Review of Systems  Constitutional:  Positive for activity change.  All other systems reviewed and are negative.   Objective:  Physical Exam  positive for previous right total hip arthroplasty right knee replacement later revision.  History olecranon fracture with ORIF olecranon by me 2021.  Negative for stroke chest pain heart attack.  All systems noncontributory.     Objective: Vital Signs: BP (!) 139/93 (BP Location: Left Arm, Patient Position: Sitting)   Pulse 85   Ht 5' 5.5" (1.664 m)   Wt 234 lb 6.4 oz (106.3 kg)   BMI 38.41 kg/m    Physical Exam Constitutional:      Appearance: He is well-developed.  HENT:     Head: Normocephalic and atraumatic.     Right Ear: External ear normal.     Left Ear: External ear normal.   Eyes:     Pupils: Pupils are equal, round, and reactive to light.  Neck:     Thyroid: No thyromegaly.     Trachea: No tracheal deviation.  Cardiovascular:     Rate and Rhythm: Normal rate.  Pulmonary:     Effort: Pulmonary effort is normal.     Breath sounds: No wheezing.  Abdominal:     General: Bowel sounds are normal.     Palpations: Abdomen is soft.  Musculoskeletal:     Cervical back: Neck supple.  Skin:    General: Skin is warm and dry.     Capillary Refill: Capillary refill takes less than 2 seconds.  Neurological:     Mental Status: He is alert and oriented to person, place, and time.  Psychiatric:        Behavior: Behavior normal.  Thought Content: Thought content normal.        Judgment: Judgment normal.        Ortho Exam patient has 5 degrees internal rotation left hip with severe groin pain.  10 degree flexion contracture. Rotation only 20 degrees with pain.  Good knee range of motion pedal pulses are normal right total knee arthroplasty midline incision is well-healed. Heart RRR Lungs Clear Vital signs in last 24 hours: @VSRANGES @  Labs:   Estimated body mass index is 38.28 kg/m as calculated from the following:   Height as of an earlier encounter on 03/03/22: 5' 5.5" (1.664 m).   Weight as of an earlier encounter on 03/03/22: 106 kg.   Imaging Review Plain radiographs demonstrate severe degenerative joint disease of the left hip(s). The bone quality appears to be good for age and reported activity level.      Assessment/Plan:  End stage arthritis, left hip(s)  The patient history, physical examination, clinical judgement of the provider and imaging studies are consistent with end stage degenerative joint disease of the left hip(s) and total hip arthroplasty is deemed medically necessary. The treatment options including medical management, injection therapy, arthroscopy and arthroplasty were discussed at length. The risks and benefits of total hip  arthroplasty were presented and reviewed. The risks due to aseptic loosening, infection, stiffness, dislocation/subluxation,  thromboembolic complications and other imponderables were discussed.  The patient acknowledged the explanation, agreed to proceed with the plan and consent was signed. Patient is being admitted for inpatient treatment for surgery, pain control, PT, OT, prophylactic antibiotics, VTE prophylaxis, progressive ambulation and ADL's and discharge planning.The patient is planning to be discharged home with home health services Pain in left hip    2. Unilateral primary osteoarthritis, left hip   3. History of total hip arthroplasty, right            With severe poly wear after 21 years   Plan: Patient might proceed with left total hip arthroplasty we discussed spinal anesthesia direct anterior approach risks of surgery bleeding infection complications.  Use of a walker postop.  He will require probably revision on his opposite right hip coming send we reviewed x-rays and discussed this with him.  He only has about a millimeter left before he starts having metal-on-metal wear and then metallosis and bone osteolysis.  Will proceed with left total of arthroplasty.  Plan would be spinal anesthesia.  Questions elicited and answered he understands agrees to proceed.   Patient's anticipated LOS is less than 2 midnights, meeting these requirements: - Younger than 47 - Lives within 1 hour of care - Has a competent adult at home to recover with post-op recover - NO history of  - Chronic pain requiring opiods  - Diabetes  - Coronary Artery Disease  - Heart failure  - Heart attack  - Stroke  - DVT/VTE  - Cardiac arrhythmia  - Respiratory Failure/COPD  - Renal failure  - Anemia  - Advanced Liver disease

## 2022-03-04 ENCOUNTER — Encounter (HOSPITAL_COMMUNITY): Payer: Self-pay

## 2022-03-04 ENCOUNTER — Encounter (HOSPITAL_COMMUNITY)
Admission: RE | Admit: 2022-03-04 | Discharge: 2022-03-04 | Disposition: A | Discharge status: Home / Self Care | Payer: Medicare PPO | Source: Ambulatory Visit | Attending: Orthopaedic Surgery | Admitting: Orthopaedic Surgery

## 2022-03-04 ENCOUNTER — Other Ambulatory Visit: Payer: Self-pay

## 2022-03-04 VITALS — BP 149/88 | HR 84 | Temp 97.4°F | Resp 19 | Ht 65.0 in | Wt 230.0 lb

## 2022-03-04 DIAGNOSIS — Z01812 Encounter for preprocedural laboratory examination: Secondary | ICD-10-CM | POA: Insufficient documentation

## 2022-03-04 DIAGNOSIS — R7989 Other specified abnormal findings of blood chemistry: Secondary | ICD-10-CM | POA: Insufficient documentation

## 2022-03-04 DIAGNOSIS — Z01818 Encounter for other preprocedural examination: Secondary | ICD-10-CM

## 2022-03-04 HISTORY — DX: Osteoarthritis of hip, unspecified: M16.9

## 2022-03-04 LAB — CBC
HCT: 50.4 % (ref 39.0–52.0)
Hemoglobin: 16.6 g/dL (ref 13.0–17.0)
MCH: 28.3 pg (ref 26.0–34.0)
MCHC: 32.9 g/dL (ref 30.0–36.0)
MCV: 85.9 fL (ref 80.0–100.0)
Platelets: 331 10*3/uL (ref 150–400)
RBC: 5.87 MIL/uL — ABNORMAL HIGH (ref 4.22–5.81)
RDW: 12.4 % (ref 11.5–15.5)
WBC: 10.7 10*3/uL — ABNORMAL HIGH (ref 4.0–10.5)
nRBC: 0 % (ref 0.0–0.2)

## 2022-03-04 LAB — SURGICAL PCR SCREEN
MRSA, PCR: NEGATIVE
Staphylococcus aureus: POSITIVE — AB

## 2022-03-04 LAB — TYPE AND SCREEN
ABO/RH(D): B POS
Antibody Screen: NEGATIVE

## 2022-03-04 NOTE — Progress Notes (Signed)
PCP - Dr. Kathryne Eriksson Cardiologist - denies  PPM/ICD - denies   Chest x-ray - 11/24/19 EKG - 11/24/19 Stress Test - denies ECHO - denies Cardiac Cath - denies  Sleep Study - denies   DM- denies    ASA/Blood Thinner Instructions: n/a   ERAS Protcol - yes PRE-SURGERY Ensure given at PAT  COVID TEST- n/a   Anesthesia review: no  Patient denies shortness of breath, fever, cough and chest pain at PAT appointment   All instructions explained to the patient, with a verbal understanding of the material. Patient agrees to go over the instructions while at home for a better understanding. The opportunity to ask questions was provided.

## 2022-03-11 ENCOUNTER — Observation Stay (HOSPITAL_COMMUNITY): Payer: Medicare PPO

## 2022-03-11 ENCOUNTER — Ambulatory Visit (HOSPITAL_COMMUNITY): Payer: Medicare PPO | Admitting: Anesthesiology

## 2022-03-11 ENCOUNTER — Other Ambulatory Visit: Payer: Self-pay

## 2022-03-11 ENCOUNTER — Encounter (HOSPITAL_COMMUNITY)
Admission: RE | Disposition: A | Discharge status: Skilled Nursing Facility | Payer: Self-pay | Source: Home / Self Care | Attending: Orthopaedic Surgery

## 2022-03-11 ENCOUNTER — Ambulatory Visit (HOSPITAL_BASED_OUTPATIENT_CLINIC_OR_DEPARTMENT_OTHER): Payer: Medicare PPO | Admitting: Anesthesiology

## 2022-03-11 ENCOUNTER — Ambulatory Visit (HOSPITAL_COMMUNITY): Payer: Medicare PPO

## 2022-03-11 ENCOUNTER — Observation Stay (HOSPITAL_COMMUNITY)
Admission: RE | Admit: 2022-03-11 | Discharge: 2022-03-13 | Disposition: A | Discharge status: Skilled Nursing Facility | Payer: Medicare PPO | Attending: Orthopaedic Surgery | Admitting: Orthopaedic Surgery

## 2022-03-11 ENCOUNTER — Encounter (HOSPITAL_COMMUNITY): Payer: Self-pay | Admitting: Orthopaedic Surgery

## 2022-03-11 DIAGNOSIS — M1612 Unilateral primary osteoarthritis, left hip: Secondary | ICD-10-CM | POA: Diagnosis present

## 2022-03-11 DIAGNOSIS — Z96641 Presence of right artificial hip joint: Secondary | ICD-10-CM | POA: Insufficient documentation

## 2022-03-11 DIAGNOSIS — R7309 Other abnormal glucose: Secondary | ICD-10-CM | POA: Insufficient documentation

## 2022-03-11 DIAGNOSIS — Z96651 Presence of right artificial knee joint: Secondary | ICD-10-CM | POA: Diagnosis not present

## 2022-03-11 DIAGNOSIS — Z96642 Presence of left artificial hip joint: Secondary | ICD-10-CM

## 2022-03-11 DIAGNOSIS — M24662 Ankylosis, left knee: Secondary | ICD-10-CM | POA: Diagnosis not present

## 2022-03-11 DIAGNOSIS — M24661 Ankylosis, right knee: Secondary | ICD-10-CM | POA: Insufficient documentation

## 2022-03-11 DIAGNOSIS — M169 Osteoarthritis of hip, unspecified: Secondary | ICD-10-CM | POA: Diagnosis present

## 2022-03-11 DIAGNOSIS — Z87891 Personal history of nicotine dependence: Secondary | ICD-10-CM | POA: Insufficient documentation

## 2022-03-11 HISTORY — PX: TOTAL HIP ARTHROPLASTY: SHX124

## 2022-03-11 SURGERY — ARTHROPLASTY, HIP, TOTAL, ANTERIOR APPROACH
Anesthesia: Monitor Anesthesia Care | Site: Hip | Laterality: Left

## 2022-03-11 MED ORDER — DOCUSATE SODIUM 100 MG PO CAPS
100.0000 mg | ORAL_CAPSULE | Freq: Two times a day (BID) | ORAL | Status: DC
  Administered 2022-03-11 – 2022-03-13 (×4): 100 mg via ORAL
  Filled 2022-03-11 (×5): qty 1

## 2022-03-11 MED ORDER — 0.9 % SODIUM CHLORIDE (POUR BTL) OPTIME
TOPICAL | Status: DC | PRN
  Administered 2022-03-11: 1000 mL

## 2022-03-11 MED ORDER — PHENOL 1.4 % MT LIQD: 1.0000 | OROMUCOSAL | Status: DC | PRN

## 2022-03-11 MED ORDER — CHLORHEXIDINE GLUCONATE 0.12 % MT SOLN
15.0000 mL | Freq: Once | OROMUCOSAL | Status: AC
  Administered 2022-03-11: 15 mL via OROMUCOSAL
  Filled 2022-03-11: qty 15

## 2022-03-11 MED ORDER — BUPIVACAINE HCL 0.5 % IJ SOLN
INTRAMUSCULAR | Status: DC | PRN
  Administered 2022-03-11: 20 mL

## 2022-03-11 MED ORDER — TRANEXAMIC ACID-NACL 1000-0.7 MG/100ML-% IV SOLN
INTRAVENOUS | Status: AC
  Filled 2022-03-11: qty 100

## 2022-03-11 MED ORDER — MENTHOL 3 MG MT LOZG: 1.0000 | LOZENGE | OROMUCOSAL | Status: DC | PRN

## 2022-03-11 MED ORDER — DIPHENHYDRAMINE HCL 12.5 MG/5ML PO ELIX: 12.5000 mg | ORAL_SOLUTION | ORAL | Status: DC | PRN

## 2022-03-11 MED ORDER — POLYETHYLENE GLYCOL 3350 17 G PO PACK: 17.0000 g | PACK | Freq: Every day | ORAL | Status: DC | PRN

## 2022-03-11 MED ORDER — LACTATED RINGERS IV SOLN: INTRAVENOUS | Status: DC

## 2022-03-11 MED ORDER — HYDROCODONE-ACETAMINOPHEN 7.5-325 MG PO TABS
1.0000 | ORAL_TABLET | ORAL | Status: DC | PRN
  Administered 2022-03-11 – 2022-03-12 (×2): 1 via ORAL
  Administered 2022-03-13: 2 via ORAL
  Filled 2022-03-11: qty 2
  Filled 2022-03-11 (×2): qty 1

## 2022-03-11 MED ORDER — CEFAZOLIN SODIUM-DEXTROSE 2-4 GM/100ML-% IV SOLN
2.0000 g | INTRAVENOUS | Status: AC
  Administered 2022-03-11: 2 g via INTRAVENOUS
  Filled 2022-03-11: qty 100

## 2022-03-11 MED ORDER — BUPIVACAINE LIPOSOME 1.3 % IJ SUSP
INTRAMUSCULAR | Status: AC
  Filled 2022-03-11: qty 20

## 2022-03-11 MED ORDER — SODIUM CHLORIDE 0.45 % IV SOLN: INTRAVENOUS | Status: DC

## 2022-03-11 MED ORDER — TRANEXAMIC ACID-NACL 1000-0.7 MG/100ML-% IV SOLN
1000.0000 mg | INTRAVENOUS | Status: AC
  Administered 2022-03-11: 1000 mg via INTRAVENOUS

## 2022-03-11 MED ORDER — STERILE WATER FOR IRRIGATION IR SOLN
Status: DC | PRN
  Administered 2022-03-11: 1000 mL

## 2022-03-11 MED ORDER — PROPOFOL 500 MG/50ML IV EMUL
INTRAVENOUS | Status: DC | PRN
  Administered 2022-03-11: 50 ug/kg/min via INTRAVENOUS

## 2022-03-11 MED ORDER — ACETAMINOPHEN 325 MG PO TABS: 325.0000 mg | ORAL_TABLET | Freq: Four times a day (QID) | ORAL | Status: DC | PRN

## 2022-03-11 MED ORDER — AMISULPRIDE (ANTIEMETIC) 5 MG/2ML IV SOLN
INTRAVENOUS | Status: AC
  Filled 2022-03-11: qty 2

## 2022-03-11 MED ORDER — METOCLOPRAMIDE HCL 5 MG PO TABS: 5.0000 mg | ORAL_TABLET | Freq: Three times a day (TID) | ORAL | Status: DC | PRN

## 2022-03-11 MED ORDER — GLYCOPYRROLATE 0.2 MG/ML IJ SOLN
INTRAMUSCULAR | Status: DC | PRN
  Administered 2022-03-11: .2 mg via INTRAVENOUS

## 2022-03-11 MED ORDER — ORAL CARE MOUTH RINSE: 15.0000 mL | Freq: Once | OROMUCOSAL | Status: AC

## 2022-03-11 MED ORDER — BUPIVACAINE HCL (PF) 0.5 % IJ SOLN
INTRAMUSCULAR | Status: AC
  Filled 2022-03-11: qty 30

## 2022-03-11 MED ORDER — TRAMADOL HCL 50 MG PO TABS
50.0000 mg | ORAL_TABLET | Freq: Four times a day (QID) | ORAL | Status: DC
  Administered 2022-03-11 – 2022-03-13 (×7): 50 mg via ORAL
  Filled 2022-03-11 (×7): qty 1

## 2022-03-11 MED ORDER — MIDAZOLAM HCL 2 MG/2ML IJ SOLN
INTRAMUSCULAR | Status: DC | PRN
  Administered 2022-03-11: 2 mg via INTRAVENOUS

## 2022-03-11 MED ORDER — METOCLOPRAMIDE HCL 5 MG/ML IJ SOLN
5.0000 mg | Freq: Three times a day (TID) | INTRAMUSCULAR | Status: DC | PRN
  Administered 2022-03-11: 10 mg via INTRAVENOUS
  Filled 2022-03-11: qty 2

## 2022-03-11 MED ORDER — AMISULPRIDE (ANTIEMETIC) 5 MG/2ML IV SOLN
5.0000 mg | Freq: Once | INTRAVENOUS | Status: AC
  Administered 2022-03-11: 5 mg via INTRAVENOUS

## 2022-03-11 MED ORDER — ASPIRIN 325 MG PO TBEC
325.0000 mg | DELAYED_RELEASE_TABLET | Freq: Every day | ORAL | Status: DC
  Administered 2022-03-12 – 2022-03-13 (×2): 325 mg via ORAL
  Filled 2022-03-11 (×2): qty 1

## 2022-03-11 MED ORDER — LIDOCAINE 2% (20 MG/ML) 5 ML SYRINGE
INTRAMUSCULAR | Status: DC | PRN
  Administered 2022-03-11: 80 mg via INTRAVENOUS

## 2022-03-11 MED ORDER — BISACODYL 10 MG RE SUPP: 10.0000 mg | Freq: Every day | RECTAL | Status: DC | PRN

## 2022-03-11 MED ORDER — BUPIVACAINE LIPOSOME 1.3 % IJ SUSP
INTRAMUSCULAR | Status: DC | PRN
  Administered 2022-03-11: 20 mL

## 2022-03-11 MED ORDER — METHOCARBAMOL 1000 MG/10ML IJ SOLN: 500.0000 mg | Freq: Four times a day (QID) | INTRAVENOUS | Status: DC | PRN

## 2022-03-11 MED ORDER — ACETAMINOPHEN 10 MG/ML IV SOLN: 1000.0000 mg | Freq: Once | INTRAVENOUS | Status: DC | PRN

## 2022-03-11 MED ORDER — METHOCARBAMOL 500 MG PO TABS: 500.0000 mg | ORAL_TABLET | Freq: Four times a day (QID) | ORAL | Status: DC | PRN

## 2022-03-11 MED ORDER — MORPHINE SULFATE (PF) 2 MG/ML IV SOLN
0.5000 mg | INTRAVENOUS | Status: DC | PRN
  Administered 2022-03-11: 1 mg via INTRAVENOUS
  Filled 2022-03-11: qty 1

## 2022-03-11 MED ORDER — BUPIVACAINE IN DEXTROSE 0.75-8.25 % IT SOLN
INTRATHECAL | Status: DC | PRN
  Administered 2022-03-11: 2 mL via INTRATHECAL

## 2022-03-11 MED ORDER — MIDAZOLAM HCL 2 MG/2ML IJ SOLN
INTRAMUSCULAR | Status: AC
  Filled 2022-03-11: qty 2

## 2022-03-11 MED ORDER — HYDROCODONE-ACETAMINOPHEN 5-325 MG PO TABS: 1.0000 | ORAL_TABLET | ORAL | Status: DC | PRN

## 2022-03-11 MED ORDER — ONDANSETRON HCL 4 MG/2ML IJ SOLN
4.0000 mg | Freq: Four times a day (QID) | INTRAMUSCULAR | Status: DC | PRN
  Administered 2022-03-11: 4 mg via INTRAVENOUS
  Filled 2022-03-11: qty 2

## 2022-03-11 MED ORDER — TRIAMCINOLONE ACETONIDE 0.1 % EX CREA: 1.0000 | TOPICAL_CREAM | Freq: Three times a day (TID) | CUTANEOUS | Status: DC | PRN

## 2022-03-11 MED ORDER — MELATONIN 5 MG PO TABS
15.0000 mg | ORAL_TABLET | Freq: Every day | ORAL | Status: DC
  Administered 2022-03-11 – 2022-03-12 (×2): 15 mg via ORAL
  Filled 2022-03-11 (×2): qty 3

## 2022-03-11 MED ORDER — PROPOFOL 10 MG/ML IV BOLUS
INTRAVENOUS | Status: DC | PRN
  Administered 2022-03-11: 30 mg via INTRAVENOUS
  Administered 2022-03-11: 20 mg via INTRAVENOUS
  Administered 2022-03-11: 30 mg via INTRAVENOUS

## 2022-03-11 MED ORDER — FENTANYL CITRATE (PF) 100 MCG/2ML IJ SOLN: 25.0000 ug | INTRAMUSCULAR | Status: DC | PRN

## 2022-03-11 MED ORDER — ONDANSETRON HCL 4 MG PO TABS: 4.0000 mg | ORAL_TABLET | Freq: Four times a day (QID) | ORAL | Status: DC | PRN

## 2022-03-11 MED ORDER — ADULT MULTIVITAMIN W/MINERALS CH
1.0000 | ORAL_TABLET | Freq: Every day | ORAL | Status: DC
  Administered 2022-03-11 – 2022-03-13 (×3): 1 via ORAL
  Filled 2022-03-11 (×3): qty 1

## 2022-03-11 SURGICAL SUPPLY — 63 items
BAG COUNTER SPONGE SURGICOUNT (BAG) ×1 IMPLANT
BENZOIN TINCTURE PRP APPL 2/3 (GAUZE/BANDAGES/DRESSINGS) ×1 IMPLANT
BLADE CLIPPER SURG (BLADE) IMPLANT
BLADE SAW SGTL 18X1.27X75 (BLADE) ×1 IMPLANT
COVER PERINEAL POST (MISCELLANEOUS) ×1 IMPLANT
COVER SURGICAL LIGHT HANDLE (MISCELLANEOUS) ×1 IMPLANT
DERMABOND ADVANCED .7 DNX12 (GAUZE/BANDAGES/DRESSINGS) IMPLANT
DRAPE C-ARM 42X72 X-RAY (DRAPES) ×1 IMPLANT
DRAPE IMP U-DRAPE 54X76 (DRAPES) ×1 IMPLANT
DRAPE STERI IOBAN 125X83 (DRAPES) ×1 IMPLANT
DRAPE U-SHAPE 47X51 STRL (DRAPES) ×3 IMPLANT
DRSG AQUACEL AG ADV 3.5X10 (GAUZE/BANDAGES/DRESSINGS) IMPLANT
DRSG MEPILEX BORDER 4X8 (GAUZE/BANDAGES/DRESSINGS) ×1 IMPLANT
DURAPREP 26ML APPLICATOR (WOUND CARE) ×1 IMPLANT
ELECT BLADE 4.0 EZ CLEAN MEGAD (MISCELLANEOUS)
ELECT CAUTERY BLADE 6.4 (BLADE) ×1 IMPLANT
ELECT REM PT RETURN 9FT ADLT (ELECTROSURGICAL) ×1
ELECTRODE BLDE 4.0 EZ CLN MEGD (MISCELLANEOUS) IMPLANT
ELECTRODE REM PT RTRN 9FT ADLT (ELECTROSURGICAL) ×1 IMPLANT
ELIMINATOR HOLE APEX DEPUY (Hips) IMPLANT
FACESHIELD WRAPAROUND (MASK) ×1 IMPLANT
FACESHIELD WRAPAROUND OR TEAM (MASK) ×2 IMPLANT
GAUZE SPONGE 4X4 12PLY STRL (GAUZE/BANDAGES/DRESSINGS) IMPLANT
GLOVE BIOGEL PI IND STRL 8 (GLOVE) ×2 IMPLANT
GLOVE ORTHO TXT STRL SZ7.5 (GLOVE) ×2 IMPLANT
GOWN STRL REUS W/ TWL LRG LVL3 (GOWN DISPOSABLE) ×1 IMPLANT
GOWN STRL REUS W/ TWL XL LVL3 (GOWN DISPOSABLE) ×1 IMPLANT
GOWN STRL REUS W/TWL 2XL LVL3 (GOWN DISPOSABLE) ×1 IMPLANT
GOWN STRL REUS W/TWL LRG LVL3 (GOWN DISPOSABLE) ×1
GOWN STRL REUS W/TWL XL LVL3 (GOWN DISPOSABLE) ×1
HEAD M SROM 36MM PLUS 1.5 (Hips) IMPLANT
KIT BASIN OR (CUSTOM PROCEDURE TRAY) ×1 IMPLANT
KIT TURNOVER KIT B (KITS) ×1 IMPLANT
LINER NEUTRAL 52X36MM PLUS 4 (Liner) IMPLANT
MANIFOLD NEPTUNE II (INSTRUMENTS) ×1 IMPLANT
NDL HYPO 21X1 ECLIPSE (NEEDLE) ×1 IMPLANT
NDL HYPO 21X1.5 SAFETY (NEEDLE) IMPLANT
NEEDLE HYPO 21X1 ECLIPSE (NEEDLE) IMPLANT
NEEDLE HYPO 21X1.5 SAFETY (NEEDLE) ×1 IMPLANT
NS IRRIG 1000ML POUR BTL (IV SOLUTION) ×1 IMPLANT
PACK TOTAL JOINT (CUSTOM PROCEDURE TRAY) ×1 IMPLANT
PAD ARMBOARD 7.5X6 YLW CONV (MISCELLANEOUS) ×2 IMPLANT
PIN SECTOR W/GRIP ACE CUP 52MM (Hips) IMPLANT
PULSAVAC PLUS IRRIG FAN TIP (DISPOSABLE)
SROM M HEAD 36MM PLUS 1.5 (Hips) ×1 IMPLANT
STEM FEMORAL SZ6 HIGH ACTIS (Stem) IMPLANT
STRIP CLOSURE SKIN 1/2X4 (GAUZE/BANDAGES/DRESSINGS) ×1 IMPLANT
SUT MNCRL AB 3-0 PS2 18 (SUTURE) IMPLANT
SUT VIC AB 0 CT1 27 (SUTURE) ×1
SUT VIC AB 0 CT1 27XBRD ANBCTR (SUTURE) ×1 IMPLANT
SUT VIC AB 1 CTX 36 (SUTURE) ×1
SUT VIC AB 1 CTX36XBRD ANBCTR (SUTURE) IMPLANT
SUT VIC AB 2-0 CT1 27 (SUTURE) ×2
SUT VIC AB 2-0 CT1 TAPERPNT 27 (SUTURE) ×1 IMPLANT
SUT VICRYL 4-0 PS2 18IN ABS (SUTURE) ×1 IMPLANT
SUT VLOC 180 0 24IN GS25 (SUTURE) ×1 IMPLANT
SYR 20CC LL (SYRINGE) ×1 IMPLANT
SYR CONTROL 10ML LL (SYRINGE) IMPLANT
TIP FAN IRRIG PULSAVAC PLUS (DISPOSABLE) IMPLANT
TOWEL GREEN STERILE (TOWEL DISPOSABLE) ×2 IMPLANT
TOWEL GREEN STERILE FF (TOWEL DISPOSABLE) ×1 IMPLANT
TRAY FOLEY MTR SLVR 16FR STAT (SET/KITS/TRAYS/PACK) ×1 IMPLANT
WATER STERILE IRR 1000ML POUR (IV SOLUTION) ×2 IMPLANT

## 2022-03-11 NOTE — Discharge Instructions (Signed)

## 2022-03-11 NOTE — Interval H&P Note (Signed)
History and Physical Interval Note:  03/11/2022 12:28 PM  Roy [name]  has presented today for surgery, with the diagnosis of left hip osteoarthritis.  The various methods of treatment have been discussed with the patient and family. After consideration of risks, benefits and other options for treatment, the patient has consented to  Procedure(s) with comments: LEFT TOTAL HIP ARTHROPLASTY ANTERIOR APPROACH (Left) - Needs RNFA as a surgical intervention.  The patient's history has been reviewed, patient examined, no change in status, stable for surgery.  I have reviewed the patient's chart and labs.  Questions were answered to the patient's satisfaction.     Marybelle Killings

## 2022-03-11 NOTE — Anesthesia Procedure Notes (Signed)
Procedure Name: MAC Date/Time: 03/11/2022 1:00 PM  Performed by: Lowella Dell, CRNAPre-anesthesia Checklist: Patient identified, Emergency Drugs available, Suction available, Patient being monitored and Timeout performed Patient Re-evaluated:Patient Re-evaluated prior to induction Oxygen Delivery Method: Nasal cannula Placement Confirmation: positive ETCO2 Dental Injury: Teeth and Oropharynx as per pre-operative assessment

## 2022-03-11 NOTE — Anesthesia Preprocedure Evaluation (Addendum)
Anesthesia Evaluation  Patient identified by MRN, date of birth, ID band Patient awake    Reviewed: Allergy & Precautions, NPO status , Patient's Chart, lab work & pertinent test results  Airway Mallampati: II  TM Distance: >3 FB Neck ROM: Full    Dental no notable dental hx.    Pulmonary former smoker   Pulmonary exam normal        Cardiovascular negative cardio ROS  Rhythm:Regular Rate:Normal     Neuro/Psych negative neurological ROS  negative psych ROS   GI/Hepatic negative GI ROS, Neg liver ROS,,,  Endo/Other  negative endocrine ROS    Renal/GU negative Renal ROS  negative genitourinary   Musculoskeletal  (+) Arthritis , Osteoarthritis,    Abdominal Normal abdominal exam  (+)   Peds  Hematology negative hematology ROS (+)   Anesthesia Other Findings   Reproductive/Obstetrics                             Anesthesia Physical Anesthesia Plan  ASA: 2  Anesthesia Plan: MAC and Spinal   Post-op Pain Management:    Induction: Intravenous  PONV Risk Score and Plan: 1 and Propofol infusion, Ondansetron, Dexamethasone and Treatment may vary due to age or medical condition  Airway Management Planned: Simple Face Mask, Natural Airway and Nasal Cannula  Additional Equipment: None  Intra-op Plan:   Post-operative Plan:   Informed Consent: I have reviewed the patients History and Physical, chart, labs and discussed the procedure including the risks, benefits and alternatives for the proposed anesthesia with the patient or authorized representative who has indicated his/her understanding and acceptance.     Dental advisory given  Plan Discussed with: CRNA  Anesthesia Plan Comments: (Lab Results      Component                Value               Date                      WBC                      10.7 (H)            03/04/2022                HGB                      16.6                 03/04/2022                HCT                      50.4                03/04/2022                MCV                      85.9                03/04/2022                PLT                      331  03/04/2022             Lab Results      Component                Value               Date                      NA                       136                 11/25/2019                K                        3.6                 11/25/2019                CO2                      24                  11/25/2019                GLUCOSE                  112 (H)             11/25/2019                BUN                      12                  11/25/2019                CREATININE               0.86                11/25/2019                CALCIUM                  8.7 (L)             11/25/2019                GFRNONAA                 >60                 11/25/2019           )       Anesthesia Quick Evaluation

## 2022-03-11 NOTE — Anesthesia Procedure Notes (Signed)
Spinal  Patient location during procedure: OR Start time: 03/11/2022 12:47 PM End time: 03/11/2022 12:57 PM Reason for block: surgical anesthesia Staffing Performed: anesthesiologist  Anesthesiologist: Pervis Hocking, DO Performed by: Pervis Hocking, DO Authorized by: Pervis Hocking, DO   Preanesthetic Checklist Completed: patient identified, IV checked, risks and benefits discussed, surgical consent, monitors and equipment checked, pre-op evaluation and timeout performed Spinal Block Patient position: sitting Prep: DuraPrep and site prepped and draped Patient monitoring: cardiac monitor, continuous pulse ox and blood pressure Approach: midline Location: L3-4 Injection technique: single-shot Needle Needle type: Pencan  Needle gauge: 24 G Needle length: 9 cm Assessment Sensory level: T6 Events: CSF return Additional Notes Functioning IV was confirmed and monitors were applied. Sterile prep and drape, including hand hygiene and sterile gloves were used. The patient was positioned and the spine was prepped. The skin was anesthetized with lidocaine.  Free flow of clear CSF was obtained prior to injecting local anesthetic into the CSF.  The spinal needle aspirated freely following injection.  The needle was carefully withdrawn.  The patient tolerated the procedure well.   Very difficult to position d/t pre-existing back pain and knee stiffness/inability to straighten legs fully

## 2022-03-11 NOTE — Progress Notes (Signed)
PT Cancellation Note  Patient Details Name: LETROY VAZGUEZ MRN: [id number] DOB: 10-07-1955   Cancelled Treatment:    Reason Eval/Treat Not Completed: Medical issues which prohibited therapy. Pt still with reduced sensation and muscle function from spinal anesthesia. PT will follow up in the morning to allow more time for anesthesia to subside.   Zenaida Niece 03/11/2022, 5:04 PM

## 2022-03-11 NOTE — Op Note (Signed)
Pre and postop diagnosis: Left hip primary osteoarthritis  Procedure: Right total of arthroplasty, direct anterior approach  Surgeon: Rodell Perna, MD  Assistant: April Fulp, RNFA  Anesthesia spinal plus Exparel and Marcaine 10+10.  EBL: 200 cc  Implants:mplants  PIN SECTOR W/GRIP ACE CUP 52MM - [id number]  Inventory Item: PIN SECTOR W/GRIP ACE CUP 52MM Serial no.: Model/Cat no.: [id number]  Implant name: PIN SECTOR W/GRIP ACE CUP 52MM - [id number] Laterality: Left Area: Hip  Manufacturer: [place] Date of Manufacture:   Action: Implanted Number Used: 1   Device Identifier: Device Identifier TypeBrock Bad APEX DEPUY - [id number]  Inventory Item: ELIMINATOR HOLE APEX DEPUY Serial no.: Model/Cat no.: [id number]  Implant nameBrock Bad APEX DEPUY - [id number] Laterality: Left Area: Hip  Manufacturer: [place] Date of Manufacture:   Action: Implanted Number Used: 1   Device Identifier: Device Identifier Type:   LINER NEUTRAL 52X36MM PLUS 4 - [id number]  Inventory Item: LINER NEUTRAL 52X36MM PLUS 4 Serial no.: Model/Cat no.: [id number]  Implant name: LINER NEUTRAL 52X36MM PLUS 4 - [id number] Laterality: Left Area: Hip  Manufacturer: [place] Date of Manufacture:   Action: Implanted Number Used: 1   Device Identifier: Device Identifier Type:   SROM M HEAD 36MM PLUS 1.5 - [id number]  Inventory Item: SROM M HEAD 36MM PLUS 1.5 Serial no.: Model/Cat no.: [id number]  Implant name: SROM M HEAD 36MM PLUS 1.5 - [id number] Laterality: Left Area: Hip  Manufacturer: [place] Date of Manufacture:   Action: Implanted Number Used: 1   Device Identifier: Device Identifier Type:   STEM FEMORAL SZ6 HIGH ACTIS - [id number]   Inventory Item: STEM FEMORAL SZ6 HIGH ACTIS Serial no.: Model/Cat no.: [id number]  Implant name: STEM FEMORAL SZ6 HIGH ACTIS - [id number] Laterality: Left Area: Hip  Manufacturer: [place] Date of Manufacture:    Action: Implanted Number Used: 1   Device Identifier: Device Identifier Type:     After induction of spinal anesthesia was Foley catheter placement Ancef prophylaxis and IV TXA 1 g there is difficulty putting on his Unna boot since patient only has 10 to 15 degrees knee flexion on both sides with the left side medial lateral tibial plateau plating with arthrofibrosis on the opposite right knee total knee arthroplasty with revision and arthrofibrosis.  Once boots were on he was down at least on the left side patient is placed on the Hana table careful positioning C-arm was brought in for visualization of both hips.  1015 drapes were applied DuraPrep large shower curtain Betadine Steri-Drape after sterile skin marker and sheet across sheet above.  Timeout procedure completed.  Incision was made 2 fingerbreadths lateral 1 inferior to ASIS obliquely to the trochanter.  Fascia was nicked extended elevated and dull cobra was placed over the top of the anterior capsule.  Capsule was opened and there was minimal joint fluid present.  Capsule was excised anteriorly and the neck was cut under C-arm visualization for confirmation of the neck cut level.  Head was removed with a corkscrew with some difficulty.  Large spur had been moved off the anterior inferior acetabulum for exposure.  Sequential reaming up to 51 for 52 cup.  There was good tight fit confirmed position under C arm with abduction and Flexion.  Apex illuminator was inserted +4 neutral liner was popped and dialed secured tested.  Hydropic applied to the femur external rotation 120 degrees and hip was taken down and under capsule was released posteriorly saving external rotators.  Preparation of the femoral canal with cookie cutter curettes rongeurs sequential broaching progressing up to a size 6 which gave a nice fill.  Small portion of the posterior trochanter broke loose with 90% of the trochanter intact.  C-arm was used for rotation visualization with  the hip reduced with trials and small piece was not able to be visualized but could be palpated with finger posteriorly.  It may have been a piece of the posterior osteophyte on the acetabulum as well.  Did not impede hip motion or stability.  External rotation 90 degrees and hip checked as the leg was taken halfway down on the floor with good stability of the hip.  Patient's opposite total hip arthroplasty had eccentric poly wear on the operative side he was short but leg lengths have been restored based on preop x-rays with good stability.  Permanent prosthesis metal ball was inserted with high offset and +1.5 neck length.  Hip was reduced good stability trochanter carefully visualized checked under rotation internal/external rotation and 90 degrees external rotation with trochanter intact.  Irrigation saline solution recauterization of transverse bleeders V-Loc closure subcutaneous tissue reapproximated with Vicryl skin staple closure postop dressing and transferred recovery.

## 2022-03-11 NOTE — Anesthesia Postprocedure Evaluation (Signed)
Anesthesia Post Note  Patient: Roy [name]  Procedure(s) Performed: LEFT TOTAL HIP ARTHROPLASTY (Left: Hip)     Patient location during evaluation: PACU Anesthesia Type: MAC and Spinal Level of consciousness: awake and alert, patient cooperative and oriented Pain management: pain level controlled Vital Signs Assessment: post-procedure vital signs reviewed and stable Respiratory status: nonlabored ventilation, spontaneous breathing and respiratory function stable Cardiovascular status: blood pressure returned to baseline and stable Postop Assessment: no apparent nausea or vomiting and spinal receding Anesthetic complications: no   No notable events documented.  Last Vitals:  Vitals:   03/11/22 1510 03/11/22 1525  BP: 104/70 104/62  Pulse: 65 62  Resp: 18 19  Temp:    SpO2: 97% 99%    Last Pain:  Vitals:   03/11/22 1525  PainSc: 0-No pain                 Deshay Kirstein,E. Jibran Crookshanks

## 2022-03-11 NOTE — Transfer of Care (Signed)
Immediate Anesthesia Transfer of Care Note  Patient: Roy [name]  Procedure(s) Performed: LEFT TOTAL HIP ARTHROPLASTY (Left: Hip)  Patient Location: PACU  Anesthesia Type:MAC and Spinal  Level of Consciousness: awake, alert , and oriented  Airway & Oxygen Therapy: Patient Spontanous Breathing  Post-op Assessment: Report given to RN and Post -op Vital signs reviewed and stable  Post vital signs: Reviewed and stable  Last Vitals:  Vitals Value Taken Time  BP 102/70 03/11/22 1455  Temp    Pulse 73 03/11/22 1458  Resp 16 03/11/22 1458  SpO2 99 % 03/11/22 1458  Vitals shown include unvalidated device data.  Last Pain:  Vitals:   03/11/22 1059  PainSc: 3       Patients Stated Pain Goal: 0 (70/76/15 1834)  Complications: No notable events documented.

## 2022-03-12 ENCOUNTER — Encounter (HOSPITAL_COMMUNITY): Payer: Self-pay | Admitting: Orthopaedic Surgery

## 2022-03-12 ENCOUNTER — Other Ambulatory Visit (HOSPITAL_BASED_OUTPATIENT_CLINIC_OR_DEPARTMENT_OTHER): Payer: Self-pay

## 2022-03-12 DIAGNOSIS — M1612 Unilateral primary osteoarthritis, left hip: Secondary | ICD-10-CM | POA: Diagnosis not present

## 2022-03-12 LAB — CBC
HCT: 41.7 % (ref 39.0–52.0)
Hemoglobin: 14.3 g/dL (ref 13.0–17.0)
MCH: 28.4 pg (ref 26.0–34.0)
MCHC: 34.3 g/dL (ref 30.0–36.0)
MCV: 82.9 fL (ref 80.0–100.0)
Platelets: 241 10*3/uL (ref 150–400)
RBC: 5.03 MIL/uL (ref 4.22–5.81)
RDW: 12.3 % (ref 11.5–15.5)
WBC: 9 10*3/uL (ref 4.0–10.5)
nRBC: 0 % (ref 0.0–0.2)

## 2022-03-12 LAB — BASIC METABOLIC PANEL
Anion gap: 7 (ref 5–15)
BUN: 6 mg/dL — ABNORMAL LOW (ref 8–23)
CO2: 24 mmol/L (ref 22–32)
Calcium: 8.1 mg/dL — ABNORMAL LOW (ref 8.9–10.3)
Chloride: 100 mmol/L (ref 98–111)
Creatinine, Ser: 0.62 mg/dL (ref 0.61–1.24)
GFR, Estimated: >60 mL/min (ref >60)
Glucose, Bld: 111 mg/dL — ABNORMAL HIGH (ref 70–99)
Potassium: 3.8 mmol/L (ref 3.5–5.1)
Sodium: 131 mmol/L — ABNORMAL LOW (ref 135–145)

## 2022-03-12 LAB — GLUCOSE, CAPILLARY: Glucose-Capillary: 113 mg/dL — ABNORMAL HIGH (ref 70–99)

## 2022-03-12 NOTE — Evaluation (Addendum)
Occupational Therapy Evaluation Patient Details Name: Roy [name] MRN: [id number] DOB: 1955-07-04 Today's Date: 03/12/2022   History of Present Illness 67 yo male admitted 1/17 for Lt direct anterior THA. PMhx: Rt THA, Rt TKA, psoriatic arthritis   Clinical Impression   This 67 yo male admitted and underwent above presents to acute OT with PLOF of being Mod I with basic ADLs and Mod I with mobility (uses a SPC). He currently is setup-total A for basic ADLs at bed level and Max A +2 for bed mobility. He will continue to benefit from acute OT with follow up at [facility] for rehab.      Recommendations for follow up therapy are one component of a multi-disciplinary discharge planning process, led by the attending physician.  Recommendations may be updated based on patient status, additional functional criteria and insurance authorization.   Follow Up Recommendations  Skilled nursing-short term rehab (<3 hours/day)     Assistance Recommended at Discharge Frequent or constant Supervision/Assistance  Patient can return home with the following Two people to help with walking and/or transfers;Two people to help with bathing/dressing/bathroom;Assistance with cooking/housework;Help with stairs or ramp for entrance;Assist for transportation    Functional Status Assessment  Patient has had a recent decline in their functional status and demonstrates the ability to make significant improvements in function in a reasonable and predictable amount of time.  Equipment Recommendations  Other (comment) (TBD next venue)       Precautions / Restrictions Precautions Precautions: Fall Restrictions Weight Bearing Restrictions: No LLE Weight Bearing: Weight bearing as tolerated Other Position/Activity Restrictions: Pt has maybe 10 degrees of AROM in Bil knees (he reports from excessive scar tissue due to fibromyalgia)      Mobility Bed Mobility Overal bed mobility: Needs Assistance Bed Mobility: Supine  to Sit, Sit to Supine     Supine to sit: Max assist, +2 for physical assistance Sit to supine: Max assist, +2 for physical assistance   General bed mobility comments: with HOB elevated, use of pad and pivot to EOB with max +2 assist to control legs and elevate trunk. Max assist for scooting hips to EOB. Max assist initially for trunk control with progression to minguard for anterior translation of trunk and sitting balance. Max +2 to return to supine and slide toward [facility]    Transfers                   General transfer comment: pt fearful of falling and would not even consider prospect of attempting standing      Balance Overall balance assessment: Needs assistance Sitting-balance support: Bilateral upper extremity supported, Feet supported Sitting balance-Leahy Scale: Zero Sitting balance - Comments: progression of max to minguard with 7 min EOB; he would not attempt to sit unsupported (without hands), would alternate one hand free at a time only                                   ADL either performed or assessed with clinical judgement   ADL Overall ADL's : Needs assistance/impaired Eating/Feeding: Independent;Bed level   Grooming: Set up;Bed level   Upper Body Bathing: Minimal assistance;Bed level   Lower Body Bathing: Total assistance;Bed level   Upper Body Dressing : Maximal assistance;Bed level   Lower Body Dressing: Total assistance;Bed level  Vision Patient Visual Report: No change from baseline              Pertinent Vitals/Pain Pain Assessment Pain Assessment: 0-10 Pain Score: 5  Pain Location: left hip with movement Pain Descriptors / Indicators: Aching, Guarding Pain Intervention(s): Limited activity within patient's tolerance, Monitored during session, Premedicated before session, Repositioned     Hand Dominance Right   Extremity/Trunk Assessment Upper Extremity Assessment Upper Extremity  Assessment: RUE deficits/detail RUE Deficits / Details: Decreased flex/ext of elbow post elbow surgery RUE Coordination: decreased gross motor           Communication Communication Communication: No difficulties   Cognition Arousal/Alertness: Awake/alert Behavior During Therapy: Anxious (about moving and pain) Overall Cognitive Status: Within Functional Limits for tasks assessed                                                  Home Living Family/patient expects to be discharged to:: Skilled nursing facility Living Arrangements: Spouse/significant other Available Help at Discharge: Family;Available 24 hours/day Type of Home: House Home Access: Ramped entrance     Home Layout: One level     Bathroom Shower/Tub: Occupational psychologist: Handicapped height     Home Equipment: Other (comment)   Additional Comments: dowel stick he put a hook on the end of for doffing and donning LB clothes, also uses a long handled sponge in shower (stands to shower)      Prior Functioning/Environment Prior Level of Function : Needs assist             Mobility Comments: has a taller bed and lift chair he uses at home, lower surfaces he has to elevate or have assist. Was walking with a cane ADLs Comments: reports independence with ADLs and does the cooking and laundry, wife drives        OT Problem List: Decreased strength;Decreased range of motion;Impaired balance (sitting and/or standing);Pain      OT Treatment/Interventions: Self-care/ADL training;DME and/or AE instruction;Patient/family education;Balance training    OT Goals(Current goals can be found in the care plan section) Acute Rehab OT Goals Patient Stated Goal: to go to rehab then home OT Goal Formulation: With patient Time For Goal Achievement: 03/26/22 Potential to Achieve Goals: Good  OT Frequency: Min 2X/week    Co-evaluation PT/OT/SLP Co-Evaluation/Treatment: Yes Reason for  Co-Treatment: For patient/therapist safety PT goals addressed during session: Mobility/safety with mobility;Balance;Strengthening/ROM OT goals addressed during session: Strengthening/ROM      AM-PAC OT "6 Clicks" Daily Activity     Outcome Measure Help from another person eating meals?: None Help from another person taking care of personal grooming?: A Little Help from another person toileting, which includes using toliet, bedpan, or urinal?: Total Help from another person bathing (including washing, rinsing, drying)?: A Lot Help from another person to put on and taking off regular upper body clothing?: A Lot Help from another person to put on and taking off regular lower body clothing?: Total 6 Click Score: 13   End of Session    Activity Tolerance: Patient limited by pain (and fear of falling) Patient left: in bed;with call bell/phone within reach;with bed alarm set  OT Visit Diagnosis: Other abnormalities of gait and mobility (R26.89);Muscle weakness (generalized) (M62.81);Pain Pain - Right/Left: Left Pain - part of body: Hip  Time: 9381-0175 OT Time Calculation (min): 24 min Charges:  OT General Charges $OT Visit: 1 Visit OT Evaluation $OT Eval Moderate Complexity: 1 Mod  [address] Circle, OTR/L Acute [employer] [facility] [phone] Office [phone]    Almon Register 03/12/2022, 5:11 PM

## 2022-03-12 NOTE — NC FL2 (Signed)
[place] LEVEL OF CARE FORM     IDENTIFICATION  Patient Name: Roy [name] Birthdate: [birth year] Sex: male Admission Date (Current Location): 03/11/2022  [facility] and [place] Number:  Herbalist and Address:  [facility], [place] [address] [place], [place] [address]      Provider Number: [id number]  Attending Physician Name and Address:  Marybelle Killings, MD  Relative Name and Phone Number:  Roy [name], spouse - [phone]    Current Level of Care: Hospital Recommended Level of Care: [place] Prior Approval Number:    Date Approved/Denied:   PASRR Number: [id number]  Discharge Plan: SNF    Current Diagnoses: Patient Active Problem List   Diagnosis Date Noted   OA (osteoarthritis) of hip 03/11/2022   Hx of total hip arthroplasty, left 03/11/2022   History of total hip arthroplasty, right 02/06/2022   Elbow fracture, left 11/24/2019   Olecranon fracture, left, closed, initial encounter 11/21/2019   Leg laceration, left, initial encounter 11/21/2019   Unilateral primary osteoarthritis, left hip 06/29/2019   Osteomyelitis of knee region [facility]) 08/03/2012   Psoriatic arthritis, 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    Orientation RESPIRATION BLADDER Height & Weight     Self, Time, Situation, Place  Normal External catheter Weight: 106.1 kg Height:  5\' 6"  (167.6 cm)  BEHAVIORAL SYMPTOMS/MOOD NEUROLOGICAL BOWEL NUTRITION STATUS      Continent Diet  AMBULATORY STATUS COMMUNICATION OF NEEDS Skin   Total Care Verbally Surgical wounds                       Personal Care Assistance Level of Assistance  Bathing, Feeding, Dressing, Total care Bathing Assistance: Limited assistance Feeding assistance: Independent Dressing Assistance: Maximum assistance     Functional Limitations Info  Sight, Hearing, Speech Sight Info: Adequate Hearing Info: Adequate Speech Info: Adequate    SPECIAL CARE FACTORS  FREQUENCY  PT (By licensed PT), OT (By licensed OT)     PT Frequency: 3-5 x per week OT Frequency: 3-5 x per week            Contractures Contractures Info: Not present    Additional Factors Info  Code Status, Allergies Code Status Info: Full code Allergies Info: demerol, morphine, oxycodone           Current Medications (03/12/2022):  This is the current hospital active medication list Current Facility-Administered Medications  Medication Dose Route Frequency Provider Last Rate Last Admin   0.45 % sodium chloride infusion   Intravenous Continuous Marybelle Killings, MD 75 mL/hr at 03/11/22 1704 New Bag at 03/11/22 1704   acetaminophen (TYLENOL) tablet 325-650 mg  325-650 mg Oral Q6H PRN Marybelle Killings, MD       aspirin EC tablet 325 mg  325 mg Oral Q breakfast Marybelle Killings, MD   325 mg at 03/12/22 1019   bisacodyl (DULCOLAX) suppository 10 mg  10 mg Rectal Daily PRN Marybelle Killings, MD       diphenhydrAMINE (BENADRYL) 12.5 MG/5ML elixir 12.5-25 mg  12.5-25 mg Oral Q4H PRN Marybelle Killings, MD       docusate sodium (COLACE) capsule 100 mg  100 mg Oral BID Marybelle Killings, MD   100 mg at 03/12/22 1019   HYDROcodone-acetaminophen (NORCO) 7.5-325 MG per tablet 1-2 tablet  1-2 tablet Oral Q4H PRN Marybelle Killings, MD   1 tablet at 03/12/22 1019   HYDROcodone-acetaminophen (NORCO/VICODIN) 5-325  MG per tablet 1-2 tablet  1-2 tablet Oral Q4H PRN Marybelle Killings, MD       melatonin tablet 15 mg  15 mg Oral QHS Marybelle Killings, MD   15 mg at 03/11/22 2140   menthol-cetylpyridinium (CEPACOL) lozenge 3 mg  1 lozenge Oral PRN Marybelle Killings, MD       Or   phenol (CHLORASEPTIC) mouth spray 1 spray  1 spray Mouth/Throat PRN Marybelle Killings, MD       methocarbamol (ROBAXIN) tablet 500 mg  500 mg Oral Q6H PRN Marybelle Killings, MD       Or   methocarbamol (ROBAXIN) 500 mg in dextrose 5 % 50 mL IVPB  500 mg Intravenous Q6H PRN Marybelle Killings, MD       metoCLOPramide (REGLAN) tablet 5-10 mg  5-10 mg Oral Q8H PRN Marybelle Killings, MD       Or   metoCLOPramide (REGLAN) injection 5-10 mg  5-10 mg Intravenous Q8H PRN Marybelle Killings, MD   10 mg at 03/11/22 1821   morphine (PF) 2 MG/ML injection 0.5-1 mg  0.5-1 mg Intravenous Q2H PRN Marybelle Killings, MD   1 mg at 03/11/22 1726   multivitamin with minerals tablet 1 tablet  1 tablet Oral Daily Marybelle Killings, MD   1 tablet at 03/12/22 1019   ondansetron (ZOFRAN) tablet 4 mg  4 mg Oral Q6H PRN Marybelle Killings, MD       Or   ondansetron [facility]) injection 4 mg  4 mg Intravenous Q6H PRN Marybelle Killings, MD   4 mg at 03/11/22 1726   polyethylene glycol (MIRALAX / GLYCOLAX) packet 17 g  17 g Oral Daily PRN Marybelle Killings, MD       traMADol Veatrice Bourbon) tablet 50 mg  50 mg Oral Q6H Marybelle Killings, MD   50 mg at 03/12/22 6295   triamcinolone cream (KENALOG) 0.1 % cream 1 Application  1 Application Topical TID PRN Marybelle Killings, MD         Discharge Medications: Please see discharge summary for a list of discharge medications.  Relevant Imaging Results:  Relevant Lab Results:   Additional Information SS# [id number]  Curlene Labrum, RN

## 2022-03-12 NOTE — Progress Notes (Signed)
Physical Therapy Treatment Patient Details Name: Roy [name] MRN: [id number] DOB: 03/05/1955 Today's Date: 03/12/2022   History of Present Illness 67 yo male admitted 1/17 for Lt direct anterior THA. PMhx: Rt THA, Rt TKA, psoriatic arthritis    PT Comments    Pt seen with OT for second session with pt demonstrating slightly improved tolerance to bil hip ROM and with education, encouragement and max +2 assist able to transition to sitting EOB for 7 min. Pt with extremely limited mobility given baseline bil knee deficits as well as limited Rt elbow ROM and current pain. Will continue to follow and pt encouraged to attempt HEP throughout day. Pt resting with pillow under left knee due to lack of extension and unable to tolerate knee unsupported in supine.     Recommendations for follow up therapy are one component of a multi-disciplinary discharge planning process, led by the attending physician.  Recommendations may be updated based on patient status, additional functional criteria and insurance authorization.  Follow Up Recommendations  Skilled nursing-short term rehab (<3 hours/day) Can patient physically be transported by private vehicle: No   Assistance Recommended at Discharge Frequent or constant Supervision/Assistance  Patient can return home with the following Two people to help with walking and/or transfers;Two people to help with bathing/dressing/bathroom;Assistance with cooking/housework;Assist for transportation;Help with stairs or ramp for entrance   Equipment Recommendations  Wheelchair (measurements PT);Wheelchair cushion (measurements PT);Hospital bed    Recommendations for Other Services       Precautions / Restrictions Precautions Precautions: Fall     Mobility  Bed Mobility Overal bed mobility: Needs Assistance Bed Mobility: Supine to Sit, Sit to Supine Rolling: Max assist, +2 for physical assistance   Supine to sit: Max assist, +2 for physical  assistance Sit to supine: Max assist, +2 for physical assistance   General bed mobility comments: with HOB elevated, use of pad and pivot to EOB with max +2 assist to control legs and elevate trunk. Max assist for scooting hips to EOB. Max assist initially for trunk control with progression to minguard for anterior translation of trunk and sitting balance. Max +2 to return to supine and slide toward [facility]    Transfers                   General transfer comment: pt fearful of falling and would not even consider prospect of attempting standing    Ambulation/Gait                   Stairs             Wheelchair Mobility    Modified Rankin (Stroke Patients Only)       Balance Overall balance assessment: Needs assistance   Sitting balance-Leahy Scale: Zero Sitting balance - Comments: progression of max to minguard with 7 min EOB                                    Cognition Arousal/Alertness: Awake/alert Behavior During Therapy: WFL for tasks assessed/performed Overall Cognitive Status: Within Functional Limits for tasks assessed                                          Exercises Total Joint Exercises Hip ABduction/ADduction: AROM, Both, Supine, 10 reps, AAROM (AAROM on LLE) Straight Leg Raises:  AAROM, Left, Supine, 10 reps    General Comments        Pertinent Vitals/Pain Pain Assessment Pain Assessment: 0-10 Pain Score: 5  Pain Location: left hip with movement Pain Descriptors / Indicators: Aching, Guarding Pain Intervention(s): Limited activity within patient's tolerance, Repositioned, Monitored during session, Premedicated before session    Home Living                          Prior Function            PT Goals (current goals can now be found in the care plan section) Progress towards PT goals: Progressing toward goals    Frequency    7X/week      PT Plan Current plan remains appropriate     Co-evaluation PT/OT/SLP Co-Evaluation/Treatment: Yes Reason for Co-Treatment: For patient/therapist safety PT goals addressed during session: Mobility/safety with mobility;Balance;Strengthening/ROM        AM-PAC PT "6 Clicks" Mobility   Outcome Measure  Help needed turning from your back to your side while in a flat bed without using bedrails?: Total Help needed moving from lying on your back to sitting on the side of a flat bed without using bedrails?: Total Help needed moving to and from a bed to a chair (including a wheelchair)?: Total Help needed standing up from a chair using your arms (e.g., wheelchair or bedside chair)?: Total Help needed to walk in hospital room?: Total Help needed climbing 3-5 steps with a railing? : Total 6 Click Score: 6    End of Session   Activity Tolerance: Patient limited by pain Patient left: in bed;with call bell/phone within reach;with bed alarm set Nurse Communication: Mobility status;Need for lift equipment PT Visit Diagnosis: Other abnormalities of gait and mobility (R26.89);Muscle weakness (generalized) (M62.81);Difficulty in walking, not elsewhere classified (R26.2)     Time: 4196-2229 PT Time Calculation (min) (ACUTE ONLY): 24 min  Charges:  $Therapeutic Activity: 8-22 mins                     Bayard Males, PT Acute Rehabilitation Services Office: [phone]    Kalan Rinn B Guillermo Nehring 03/12/2022, 1:30 PM

## 2022-03-12 NOTE — TOC Initial Note (Addendum)
Transition of Care [facility]) - Initial/Assessment Note    Patient Details  Name: Roy [name] MRN: [id number] Date of Birth: August 14, 1955  Transition of Care [facility]) CM/SW Contact:    Curlene Labrum, RN Phone Number: 03/12/2022, 11:18 AM  Clinical Narrative:                 CM met with the patient at the bedside S/P Left THA by Dr. Lorin Mercy.  PT is recommending SNF placement and the patient is in agreement.  Patient will be faxed out in the hub and bed offers presented to the patient.  Likely discharge to accepting facility in the next 1-2 days once facility is chosen by the patient and insurance authorization has been obtained.  The patient will need PTAR transport to the facility.  03/12/2022 1600 - Discussed bed offers with patient and he [facility] for [employer] choice.  [place] accepted in the hub and insurance authorization was started through [employer].  Expected Discharge Plan: Skilled Nursing Facility Barriers to Discharge: Continued Medical Work up   Patient Goals and CMS Choice Patient states their goals for this hospitalization and ongoing recovery are:: Patient want to get better and go to short term rehabilitation CMS Medicare.gov Compare Post Acute Care list provided to:: Patient Choice offered to / list presented to : Patient [place] ownership interest in [facility].provided to:: Patient    Expected Discharge Plan and Services   Discharge Planning Services: CM Consult Post Acute Care Choice: [place] Living arrangements for the past 2 months: Single Family Home                                      Prior Living Arrangements/Services Living arrangements for the past 2 months: Single Family Home Lives with:: Spouse Patient language and need for interpreter reviewed:: Yes        Need for Family Participation in Patient Care: Yes (Comment) Care giver support system in place?: Yes (comment) Current home  services: DME (patient currently has RW, walk-in shower, 3:1 at the home) Criminal Activity/Legal Involvement Pertinent to Current Situation/Hospitalization: No - Comment as needed  Activities of Daily Living Home Assistive Devices/Equipment: Cane (specify quad or straight), Eyeglasses, Walker (specify type), Wheelchair ADL Screening (condition at time of admission) Patient's cognitive ability adequate to safely complete daily activities?: Yes Is the patient deaf or have difficulty hearing?: No Does the patient have difficulty seeing, even when wearing glasses/contacts?: No Does the patient have difficulty concentrating, remembering, or making decisions?: No Patient able to express need for assistance with ADLs?: Yes Does the patient have difficulty dressing or bathing?: No Independently performs ADLs?: Yes (appropriate for developmental age) Does the patient have difficulty walking or climbing stairs?: Yes Weakness of Legs: Both Weakness of Arms/Hands: None  Permission Sought/Granted Permission sought to share information with : Case Manager, Family Supports, Customer service manager Permission granted to share information with : Yes, Verbal Permission Granted     Permission granted to share info w AGENCY: SNF facilities for placement  Permission granted to share info w Relationship: spouse, Khylan Sawyer - [phone]     Emotional Assessment Appearance:: Appears stated age Attitude/Demeanor/Rapport: Gracious Affect (typically observed): Accepting Orientation: : Oriented to Self, Oriented to Place, Oriented to  Time, Oriented to Situation Alcohol / Substance Use: Not Applicable Psych Involvement: No (comment)  Admission diagnosis:  OA (osteoarthritis) of  hip [M16.9] Hx of total hip arthroplasty, left [Z96.642] Patient Active Problem List   Diagnosis Date Noted   OA (osteoarthritis) of hip 03/11/2022   Hx of total hip arthroplasty, left 03/11/2022   History of total  hip arthroplasty, right 02/06/2022   Elbow fracture, left 11/24/2019   Olecranon fracture, left, closed, initial encounter 11/21/2019   Leg laceration, left, initial encounter 11/21/2019   Unilateral primary osteoarthritis, left hip 06/29/2019   Osteomyelitis of knee region [facility]) 08/03/2012   Psoriatic arthritis, 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

## 2022-03-12 NOTE — Care Management Obs Status (Signed)
[place] NOTIFICATION   Patient Details  Name: Roy [name] MRN: [id number] Date of Birth: 10-13-1955   Medicare Observation Status Notification Given:  Yes    Curlene Labrum, RN 03/12/2022, 11:14 AM

## 2022-03-12 NOTE — Progress Notes (Signed)
Patient ID: Roy [name], male   DOB: 1955-08-31, 67 y.o.   MRN: [id number]   Subjective: 1 Day Post-Op Procedure(s) (LRB): LEFT TOTAL HIP ARTHROPLASTY (Left) Patient reports pain as moderate.    Objective: Vital signs in last 24 hours: Temp:  [97.9 F (36.6 C)-99.1 F (37.3 C)] 98.2 F (36.8 C) (01/18 0824) Pulse Rate:  [59-99] 92 (01/18 0824) Resp:  [13-20] 18 (01/18 0824) BP: (102-165)/(62-88) 148/74 (01/18 0824) SpO2:  [93 %-100 %] 93 % (01/18 0824) Weight:  [104.3 kg-106.1 kg] 106.1 kg (01/17 1612)  Intake/Output from previous day: 01/17 0701 - 01/18 0700 In: 1200 [I.V.:1000; IV Piggyback:200] Out: 1100 [Urine:1100] Intake/Output this shift: Total I/O In: -  Out: 600 [Urine:600]  Recent Labs    03/12/22 0353  HGB 14.3   Recent Labs    03/12/22 0353  WBC 9.0  RBC 5.03  HCT 41.7  PLT 241   Recent Labs    03/12/22 0353  NA 131*  K 3.8  CL 100  CO2 24  BUN 6*  CREATININE 0.62  GLUCOSE 111*  CALCIUM 8.1*   No results for input(s): "LABPT", "INR" in the last 72 hours.  Arthrofibrosis of both knees as previously noted.  Left direct anterior hip dressing is dry.  Minimal swelling no ecchymosis. DG Hip Port Unilat With Pelvis 1V Left  Result Date: 03/11/2022 CLINICAL DATA:  Total left hip arthroplasty. EXAM: DG HIP (WITH OR WITHOUT PELVIS) 1V PORT LEFT COMPARISON:  Frontal view of the bilateral hips 02/03/2022 FINDINGS: Interval total left hip arthroplasty. Redemonstration of total right hip arthroplasty with asymmetric elevation of the right femoral head prosthesis with respect to the mid aspect of the acetabular cup, likely from polyethylene wear. Lucency is seen to indicate hardware failure or loosening of either total hip arthroplasty. No acute fracture or dislocation. Recent postoperative subcutaneous air is seen about the lateral left hip and thigh. IMPRESSION: 1. Interval total left hip arthroplasty without evidence of hardware failure. 2.  Redemonstration of total right hip arthroplasty. Unchanged superior right acetabular polyethylene wear. Electronically Signed   By: Yvonne Kendall M.D.   On: 03/11/2022 16:35   DG HIP UNILAT WITH PELVIS 1V LEFT  Result Date: 03/11/2022 CLINICAL DATA:  Elective LEFT total hip arthroplasty EXAM: DG HIP (WITH OR WITHOUT PELVIS) 1V*L* COMPARISON:  02/03/2022 Fluoroscopy time: 0 minutes 20 seconds Dose: 4.05 mGy Images: 3 FINDINGS: Osseous demineralization. Indwelling RIGHT hip prosthesis. Osteoarthritic changes of LEFT hip with subsequent placement of LEFT hip prosthesis. No fracture or dislocation identified. Femoral head component of RIGHT hip prosthesis is off center projecting over the acetabular cup, may represent asymmetric liner wear superiorly though on a single AP view cannot entirely exclude dislocation. IMPRESSION: LEFT hip prosthesis without acute complication. Asymmetric positioning of femoral head component of RIGHT hip prosthesis with respect to the RIGHT acetabular cup, may represent asymmetric liner wear superiorly though on single AP view cannot exclude dislocation; this appears unchanged versus the previous exam Electronically Signed   By: Lavonia Dana M.D.   On: 03/11/2022 15:15   DG C-Arm 1-60 Min-No Report  Result Date: 03/11/2022 Fluoroscopy was utilized by the requesting physician.  No radiographic interpretation.    Assessment/Plan: 1 Day Post-Op Procedure(s) (LRB): LEFT TOTAL HIP ARTHROPLASTY (Left) Expected will take 2 people from PT to initially get him up.  Since his knee is only flexed 10 to 15 degrees he had to wiggle slide has a very high bed that he gets off of and  also has a lift chair at home.  Will see how he does with therapy and then make a determination about home with home health PT versus skilled facility.  Patient had tibial plateau fractures on the left with medial lateral incision and plates and also had right total knee arthroplasty by Dr. 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  Marybelle Killings 03/12/2022, 10:18 AM

## 2022-03-12 NOTE — Evaluation (Signed)
Physical Therapy Evaluation Patient Details Name: Roy [name] MRN: [id number] DOB: 03/16/1955 Today's Date: 03/12/2022  History of Present Illness  67 yo male admitted 1/17 for Lt direct anterior THA. PMhx: Rt THA, Rt TKA, psoriatic arthritis  Clinical Impression  Pt pleasant and willing to mobilize stating limited bil hip and knee ROM at baseline but functioning at Mod I level. Pt currently max +2 to roll in bed, unable to achieve sitting and tolerating grossly 10 degrees of hip ROM despite premedication. Pt with significant deficits in mobility, ROM, strength and function who at present level will require SNF for D/C. Pt will benefit from acute therapy to maximize mobility, safety and ROM to decrease burden of care.        Recommendations for follow up therapy are one component of a multi-disciplinary discharge planning process, led by the attending physician.  Recommendations may be updated based on patient status, additional functional criteria and insurance authorization.  Follow Up Recommendations Skilled nursing-short term rehab (<3 hours/day) Can patient physically be transported by private vehicle: No    Assistance Recommended at Discharge Frequent or constant Supervision/Assistance  Patient can return home with the following  Two people to help with walking and/or transfers;Two people to help with bathing/dressing/bathroom;Assistance with cooking/housework;Assist for transportation;Help with stairs or ramp for entrance    Equipment Recommendations Wheelchair (measurements PT);Wheelchair cushion (measurements PT);Hospital bed (hoyer lift)  Recommendations for Other Services       Functional Status Assessment Patient has had a recent decline in their functional status and/or demonstrates limited ability to make significant improvements in function in a reasonable and predictable amount of time     Precautions / Restrictions Precautions Precautions:  Fall Restrictions Weight Bearing Restrictions: Yes LLE Weight Bearing: Weight bearing as tolerated      Mobility  Bed Mobility Overal bed mobility: Needs Assistance Bed Mobility: Rolling Rolling: Max assist, +2 for physical assistance         General bed mobility comments: attempted to transition to sitting with bed elevated and use of pad with max assist, pt able to partially pivot and rise but could not achieve full sitting, limited by pain and returned to supine. Max +2 to roll to position pads and slide up in bed    Transfers                        Ambulation/Gait                  Stairs            Wheelchair Mobility    Modified Rankin (Stroke Patients Only)       Balance                                             Pertinent Vitals/Pain Pain Assessment Pain Assessment: 0-10 Pain Score: 7  Pain Location: left hip with movement, 4/10 at rest initially Pain Descriptors / Indicators: Aching, Guarding Pain Intervention(s): Limited activity within patient's tolerance, Repositioned, Monitored during session    [place] expects to be discharged to:: Private residence Living Arrangements: Spouse/significant other Available Help at Discharge: Family;Available 24 hours/day Type of Home: House Home Access: Ramped entrance       Home Layout: One level Home Equipment: Conservation officer, nature (2 wheels);Cane - single point      Prior  Function Prior Level of Function : Needs assist             Mobility Comments: has a taller bed and lift chair he uses at home, lower surfaces he has to elevate or have assist. Was walking with a cane ADLs Comments: reports independence with ADLs and does the cooking and laundry, wife drives     Hand Dominance        Extremity/Trunk Assessment   Upper Extremity Assessment Upper Extremity Assessment: Generalized weakness    Lower Extremity Assessment Lower Extremity  Assessment: RLE deficits/detail;LLE deficits/detail RLE Deficits / Details: grossly 20 degrees hip and knee flexion available ROM, Hip abduction grossly 20 LLE Deficits / Details: knee lacking grossly 10 degrees full extension and approximately 10 degrees available ROM, hip flexion and abduction grossly 10 degrees       Communication   Communication: No difficulties  Cognition Arousal/Alertness: Awake/alert Behavior During Therapy: WFL for tasks assessed/performed Overall Cognitive Status: Within Functional Limits for tasks assessed                                          General Comments      Exercises Total Joint Exercises Hip ABduction/ADduction: AAROM, Left, Supine (extremely limited ROM)   Assessment/Plan    PT Assessment Patient needs continued PT services  PT Problem List Decreased strength;Decreased mobility;Decreased safety awareness;Decreased range of motion;Decreased activity tolerance;Decreased balance;Decreased knowledge of use of DME;Pain       PT Treatment Interventions DME instruction;Therapeutic activities;Functional mobility training;Stair training;Balance training;Therapeutic exercise;Gait training;Patient/family education    PT Goals (Current goals can be found in the Care Plan section)  Acute Rehab PT Goals Patient Stated Goal: be able to return home PT Goal Formulation: With patient Time For Goal Achievement: 03/26/22 Potential to Achieve Goals: Fair    Frequency 7X/week     Co-evaluation               AM-PAC PT "6 Clicks" Mobility  Outcome Measure Help needed turning from your back to your side while in a flat bed without using bedrails?: Total Help needed moving from lying on your back to sitting on the side of a flat bed without using bedrails?: Total Help needed moving to and from a bed to a chair (including a wheelchair)?: Total Help needed standing up from a chair using your arms (e.g., wheelchair or bedside chair)?:  Total Help needed to walk in hospital room?: Total Help needed climbing 3-5 steps with a railing? : Total 6 Click Score: 6    End of Session   Activity Tolerance: Patient limited by pain Patient left: in bed;with call bell/phone within reach;with bed alarm set;with nursing/sitter in room Nurse Communication: Mobility status;Need for lift equipment PT Visit Diagnosis: Other abnormalities of gait and mobility (R26.89);Muscle weakness (generalized) (M62.81);Difficulty in walking, not elsewhere classified (R26.2)    Time: 7564-3329 PT Time Calculation (min) (ACUTE ONLY): 24 min   Charges:   PT Evaluation $PT Eval Moderate Complexity: 1 Mod PT Treatments $Therapeutic Activity: 8-22 mins        Bayard Males, PT Acute Rehabilitation Services Office: [phone]   Lamarr Lulas 03/12/2022, 8:43 AM

## 2022-03-13 DIAGNOSIS — M1612 Unilateral primary osteoarthritis, left hip: Secondary | ICD-10-CM | POA: Diagnosis not present

## 2022-03-13 LAB — CBC
HCT: 41.4 % (ref 39.0–52.0)
Hemoglobin: 14.8 g/dL (ref 13.0–17.0)
MCH: 29.4 pg (ref 26.0–34.0)
MCHC: 35.7 g/dL (ref 30.0–36.0)
MCV: 82.1 fL (ref 80.0–100.0)
Platelets: 222 10*3/uL (ref 150–400)
RBC: 5.04 MIL/uL (ref 4.22–5.81)
RDW: 12.4 % (ref 11.5–15.5)
WBC: 10.6 10*3/uL — ABNORMAL HIGH (ref 4.0–10.5)
nRBC: 0 % (ref 0.0–0.2)

## 2022-03-13 MED ORDER — HYDROCODONE-ACETAMINOPHEN 7.5-325 MG PO TABS: 1.0000 | ORAL_TABLET | Freq: Four times a day (QID) | ORAL | 0 refills | Status: DC | PRN

## 2022-03-13 MED ORDER — ASPIRIN 325 MG PO TABS: 325.0000 mg | ORAL_TABLET | Freq: Every day | ORAL | Status: DC

## 2022-03-13 MED ORDER — METHOCARBAMOL 500 MG PO TABS: 500.0000 mg | ORAL_TABLET | Freq: Four times a day (QID) | ORAL | 0 refills | Status: DC | PRN

## 2022-03-13 NOTE — Progress Notes (Signed)
Physical Therapy Treatment Patient Details Name: Roy [name] MRN: [id number] DOB: Jun 26, 1955 Today's Date: 03/13/2022   History of Present Illness 67 yo male admitted 1/17 for Lt direct anterior THA. PMhx: Rt THA, Rt TKA, psoriatic arthritis    PT Comments    Pt with goal to stand today, stood x2 minutes with mod-max +2 assist. Pt requires increased time to mobilize and very elevated bed height to reach standing given lack of knee flexion. Pt progressing slowly, plans to d/c to [facility] today.      Recommendations for follow up therapy are one component of a multi-disciplinary discharge planning process, led by the attending physician.  Recommendations may be updated based on patient status, additional functional criteria and insurance authorization.  Follow Up Recommendations  Skilled nursing-short term rehab (<3 hours/day) Can patient physically be transported by private vehicle: No   Assistance Recommended at Discharge Frequent or constant Supervision/Assistance  Patient can return home with the following Two people to help with walking and/or transfers;Two people to help with bathing/dressing/bathroom;Assistance with cooking/housework;Assist for transportation;Help with stairs or ramp for entrance   Equipment Recommendations  Wheelchair (measurements PT);Wheelchair cushion (measurements PT);Hospital bed    Recommendations for Other Services       Precautions / Restrictions Precautions Precautions: Fall Restrictions Weight Bearing Restrictions: Yes LLE Weight Bearing: Weight bearing as tolerated     Mobility  Bed Mobility Overal bed mobility: Needs Assistance Bed Mobility: Supine to Sit, Sit to Supine     Supine to sit: Max assist, +2 for physical assistance Sit to supine: Max assist, +2 for physical assistance   General bed mobility comments: assist for trunk and LE management, modified helicopter technique utilized to get pt to/from eOB    Transfers Overall  transfer level: Needs assistance Equipment used: Rolling walker (2 wheels) Transfers: Sit to/from Stand Sit to Stand: +2 physical assistance, Mod assist           General transfer comment: for power up, steadying, bringing LEs into BOS. VERY elevated bed height given lack of knee flexion. x2 lateral steps towards [facility]    Ambulation/Gait                   Stairs             Wheelchair Mobility    Modified Rankin (Stroke Patients Only)       Balance Overall balance assessment: Needs assistance Sitting-balance support: Bilateral upper extremity supported, Feet supported Sitting balance-Leahy Scale: Fair     Standing balance support: Bilateral upper extremity supported, During functional activity Standing balance-Leahy Scale: Poor                              Cognition Arousal/Alertness: Awake/alert Behavior During Therapy: Anxious Overall Cognitive Status: Within Functional Limits for tasks assessed                                 General Comments: can be irritable        Exercises Total Joint Exercises Ankle Circles/Pumps: Left, 20 reps, AAROM    General Comments        Pertinent Vitals/Pain Pain Assessment Pain Assessment: Faces Faces Pain Scale: Hurts even more Pain Location: left hip with movement Pain Descriptors / Indicators: Aching, Guarding Pain Intervention(s): Monitored during session, Limited activity within patient's tolerance, Repositioned, Premedicated before session    Home Living  Prior Function            PT Goals (current goals can now be found in the care plan section) Acute Rehab PT Goals Patient Stated Goal: be able to return home PT Goal Formulation: With patient Time For Goal Achievement: 03/26/22 Potential to Achieve Goals: Fair Progress towards PT goals: Progressing toward goals    Frequency    7X/week      PT Plan Current plan remains  appropriate    Co-evaluation              AM-PAC PT "6 Clicks" Mobility   Outcome Measure  Help needed turning from your back to your side while in a flat bed without using bedrails?: Total Help needed moving from lying on your back to sitting on the side of a flat bed without using bedrails?: Total Help needed moving to and from a bed to a chair (including a wheelchair)?: Total Help needed standing up from a chair using your arms (e.g., wheelchair or bedside chair)?: Total Help needed to walk in hospital room?: Total Help needed climbing 3-5 steps with a railing? : Total 6 Click Score: 6    End of Session   Activity Tolerance: Patient limited by pain Patient left: in bed;with call bell/phone within reach;with bed alarm set Nurse Communication: Mobility status PT Visit Diagnosis: Other abnormalities of gait and mobility (R26.89);Muscle weakness (generalized) (M62.81)     Time: 9373-4287 PT Time Calculation (min) (ACUTE ONLY): 17 min  Charges:  $Therapeutic Activity: 8-22 mins                     Stacie Glaze, PT DPT Acute Rehabilitation Services Pager [phone]  Office [phone]    Roxine Caddy E Ruffin Pyo 03/13/2022, 1:53 PM

## 2022-03-13 NOTE — Discharge Summary (Addendum)
Physician Discharge Summary  Patient ID: Roy [name] MRN: [id number] DOB/AGE: 1955/03/03 67 y.o.  Admit date: 03/11/2022 Discharge date: 03/13/2022  Admission Diagnoses: 1.Left hip primary osteoarthritis 2.  Arthrofibrosis both knees.  Right total knee arthroplasty in the past ORIF medial lateral tibial plateau fracture on the left knee.  Both knees flex less than 20 degrees. Discharge Diagnoses:  Principal Problem:   OA (osteoarthritis) of hip Active Problems:   Hx of total hip arthroplasty, left   Discharged Condition: stable  Hospital Course:  Patient has severe left hip primary osteoarthritis.  Previous right total hip is been done more than 15 years ago has eccentric polyethylene wear currently not symptomatic but will require polyethylene revision surgery at some point in a few months.  Complicated history of right knee total knee arthroplasty more than 5 years ago with postop arthrofibrosis repeat surgery manipulation lysis of adhesions and repeat arthrofibrosis severe.  Patient father had similar problem.  Patient surgery was done by Dr. Joni Fears.  Patient underwent left total hip arthroplasty direct anterior approach.  Postop due to his knee arthrofibrosis he had difficulty and was not able to mobilize well with therapy with his hip pain and stiff knees.  Normally he needs to a high bed and wiggles his boots and then with his knees in extended position slides off the bed.  Patient has not ambulated in the past with a walker.  Consults:  PT and OT.  Significant Diagnostic Studies: labs: Postop hemoglobin was greater than 14.  Treatments: surgery: Left total hip arthroplasty direct anterior approach.  Medication List     STOP taking these medications    Comirnaty syringe Generic drug: COVID-19 mRNA vaccine 2023-2024   Fluad Quadrivalent 0.5 ML injection Generic drug: influenza vaccine adjuvanted   ibuprofen 200 MG tablet Commonly known as: ADVIL   Pfizer  COVID-19 Vac Bivalent injection Generic drug: COVID-19 mRNA bivalent vaccine Therapist, music)   Pfizer-BioNT COVID-19 Vac-TriS Susp injection Generic drug: COVID-19 mRNA Vac-TriS Therapist, music)       TAKE these medications    aspirin 325 MG tablet Commonly known as: Bayer Aspirin Take 1 tablet (325 mg total) by mouth daily. Take one daily for 4 wks for DVT prophylaxis   fluticasone 50 MCG/ACT nasal spray Commonly known as: FLONASE Place 2 sprays into both nostrils at bedtime as needed for allergies or rhinitis.   HYDROcodone-acetaminophen 7.5-325 MG tablet Commonly known as: Norco Take 1-2 tablets by mouth every 6 (six) hours as needed for moderate pain.   melatonin 5 MG Tabs Take 15 mg by mouth at bedtime.   methocarbamol 500 MG tablet Commonly known as: ROBAXIN Take 1 tablet (500 mg total) by mouth every 6 (six) hours as needed for muscle spasms.   multivitamin with minerals Tabs tablet Take 1 tablet by mouth daily.   triamcinolone cream 0.1 % Commonly known as: KENALOG Apply 1 Application topically as needed (psoriasis).     Discharge Exam: Blood pressure (!) 140/85, pulse (!) 104, temperature 97.9 F (36.6 C), temperature source Oral, resp. rate 18, height 5\' 6"  (1.676 m), weight 106.1 kg, SpO2 93 %. Physical exam: Dressing was dry at time of discharge.  Arthrofibrosis both knees with minimal knee flexion right and left.  Healed medial lateral tibial plateau incisions on the left midline incision on the right knee after total knee arthroplasty revision.  Disposition: Patient discharged to skilled nursing facility.  There are no hip precautions for his left hip.  He does have a right  total hip arthroplasty done from posterior approach and still has posterior hip precautions on his right hip.   Patient is weightbearing as tolerated with both right and left lower extremity.  Prescription for Robaxin and Norco prescribed.  He will be on 1 aspirin a day for DVT prophylaxis x 4 weeks.   Office follow-up with Dr. Lorin Mercy in 2 weeks.  His dressing is intact and he can leave it on until his office follow-up.  If it comes off then a new dressing can be applied.  Contact information for follow-up providers     Marybelle Killings, MD Follow up in 2 week(s).   Specialty: Orthopedic Surgery Contact information: [place] [address] [phone]              Contact information for after-discharge care     [facility] .   Service: Skilled Nursing Contact information: [address] [place] [phone]                     Signed: Marybelle Killings 03/13/2022, 9:53 AM

## 2022-03-13 NOTE — Progress Notes (Signed)
Subjective: 2 Days Post-Op Procedure(s) (LRB): LEFT TOTAL HIP ARTHROPLASTY (Left) Patient reports pain as mild.  Has not stood up yet.   Objective: Vital signs in last 24 hours: Temp:  [97.8 F (36.6 C)-98.7 F (37.1 C)] 97.9 F (36.6 C) (01/19 0744) Pulse Rate:  [92-104] 104 (01/19 0744) Resp:  [17-18] 18 (01/19 0744) BP: (140-150)/(83-85) 140/85 (01/19 0744) SpO2:  [91 %-93 %] 93 % (01/19 0744)  Intake/Output from previous day: 01/18 0701 - 01/19 0700 In: 1100 [P.O.:1100] Out: 1700 [Urine:1700] Intake/Output this shift: Total I/O In: -  Out: 200 [Urine:200]  Recent Labs    03/12/22 0353 03/13/22 0617  HGB 14.3 14.8   Recent Labs    03/12/22 0353 03/13/22 0617  WBC 9.0 10.6*  RBC 5.03 5.04  HCT 41.7 41.4  PLT 241 222   Recent Labs    03/12/22 0353  NA 131*  K 3.8  CL 100  CO2 24  BUN 6*  CREATININE 0.62  GLUCOSE 111*  CALCIUM 8.1*   No results for input(s): "LABPT", "INR" in the last 72 hours.  Neurologically intact   Assessment/Plan: 2 Days Post-Op Procedure(s) (LRB): LEFT TOTAL HIP ARTHROPLASTY (Left) Up with therapy   Plan SNF. Has arthrofibrosis of his knees. Needs bed up high to pivot to get up.    Marybelle Killings 03/13/2022, 9:41 AM

## 2022-03-13 NOTE — TOC Transition Note (Addendum)
Transition of Care [facility]) - CM/SW Discharge Note   Patient Details  Name: Roy [name] MRN: [id number] Date of Birth: 02-17-1956  [facility] CM/SW Contact:  Curlene Labrum, RN Phone Number: 03/13/2022, 10:35 AM   Clinical Narrative:    CM obtained insurance authorization for placement at [facility].  I called and notified the attending MD, Dr. Lorin Mercy and he placed discharge summary and orders.   Discharge summary was uploaded in the hub for the facility.  I called and left a voicemail for admissions with [facility] but have been unable to reach the MSW at the facility this morning.  I called Star, CM at the sister facility and asked for contact with the DON at the facility to obtain admission bed prior to transportation arrangement to the facility.  03/13/2022 1120 - CM spoke with Dorian Pod, Scientist, physiological at [facility] and she accepted the patient for admission to the facility today.  [facility] is arranged to transport the patient to the facility.  Bedside nursing - please call report to [facility] SN[facility] at [phone] for room 1201.   Final next level of care: Skilled Nursing Facility Barriers to Discharge: Continued Medical Work up   Patient Goals and CMS Choice CMS Medicare.gov Compare Post Acute Care list provided to:: Patient Choice offered to / list presented to : Patient  Discharge Placement                         Discharge Plan and Services Additional resources added to the After Visit Summary for     Discharge Planning Services: CM Consult Post Acute Care Choice: [place]                               Social Determinants of Health (SDOH) Interventions SDOH Screenings   Food Insecurity: No Food Insecurity (03/11/2022)  Housing: Low Risk  (03/11/2022)  Transportation Needs: No Transportation Needs (03/11/2022)  Utilities: Not At Risk (03/11/2022)  Tobacco Use: Medium Risk (03/12/2022)     Readmission  Risk Interventions     No data to display

## 2022-03-18 ENCOUNTER — Encounter: Payer: PRIVATE HEALTH INSURANCE | Admitting: Orthopaedic Surgery

## 2022-03-27 ENCOUNTER — Encounter: Payer: Self-pay | Admitting: Orthopaedic Surgery

## 2022-03-27 ENCOUNTER — Ambulatory Visit (INDEPENDENT_AMBULATORY_CARE_PROVIDER_SITE_OTHER): Payer: Medicare PPO

## 2022-03-27 ENCOUNTER — Ambulatory Visit (INDEPENDENT_AMBULATORY_CARE_PROVIDER_SITE_OTHER): Payer: Medicare PPO | Admitting: Orthopaedic Surgery

## 2022-03-27 VITALS — Ht 66.0 in | Wt 233.0 lb

## 2022-03-27 DIAGNOSIS — Z96642 Presence of left artificial hip joint: Secondary | ICD-10-CM | POA: Diagnosis not present

## 2022-03-27 NOTE — Progress Notes (Signed)
Post-Op Visit Note   Patient: Roy [name]           Date of Birth: December 12, 1955           MRN: [id number] Visit Date: 03/27/2022 PCP: Christain Sacramento, MD   Assessment & Plan: Follow-up total of arthroplasty left hip on 03/11/2022.  Subcuticular closure new dressing applied.  Handicap placard x 5 years filled out since he has arthrofibrosis and essentially nearly fused knees with minimal flexion right and left.  Hip x-rays look good.  Chief Complaint:  Chief Complaint  Patient presents with   Left Hip - Routine Post Op    03/11/2022 Left THA   Visit Diagnoses:  1. S/P total left hip arthroplasty     Plan: Recheck 3 weeks.  Follow-Up Instructions: Return in about 3 weeks (around 04/17/2022).   Orders:  Orders Placed This Encounter  Procedures   XR HIP UNILAT W OR W/O PELVIS 2-3 VIEWS LEFT   No orders of the defined types were placed in this encounter.   Imaging: XR HIP UNILAT W OR W/O PELVIS 2-3 VIEWS LEFT  Result Date: 03/27/2022 Standing AP pelvis frog-leg left hip demonstrates total hip arthroplasty on the left.  Unchanged position from 03/11/2022 images.  Again right total hip arthroplasty shows eccentric polyethylene wear superior lateral head position. Impression: Satisfactory left total hip arthroplasty.   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  Past Surgical History:  Procedure Laterality Date   EXCISIONAL TOTAL KNEE ARTHROPLASTY WITH ANTIBIOTIC SPACERS Right 07/07/2012   Procedure: EXCISIONAL TOTAL KNEE ARTHROPLASTY WITH ANTIBIOTIC SPACERS;  Surgeon: Garald Balding, MD;  Location: [place];  Service: Orthopedics;  Laterality: Right;  Irrigation and Debridement Right Total Knee Replacement, Insertion of ABX Spacer   KNEE ARTHROSCOPY Right 07/04/2012   Procedure: ARTHROSCOPY I&D KNEE;  Surgeon: Newt Minion, MD;  Location: [place];  Service: Orthopedics;  Laterality: Right;   ORIF ELBOW FRACTURE Left 11/24/2019   Procedure: BIOPSY, OPEN REDUCTION INTERNAL FIXATION (ORIF) LEFT ELBOW/OLECRANON FRACTURE;  Surgeon: Marybelle Killings, MD;  Location: [place];  Service: Orthopedics;  Laterality: Left;   ORIF TIBIA FRACTURE  2008   TOTAL HIP ARTHROPLASTY Right 2001   TOTAL HIP ARTHROPLASTY Left 03/11/2022   Procedure: LEFT TOTAL HIP ARTHROPLASTY;  Surgeon: Marybelle Killings, MD;  Location: [place];  Service: Orthopedics;  Laterality: Left;  Needs RNFA   TOTAL KNEE ARTHROPLASTY Right 09/12/2012   redo from 2002 [place] Right 09/13/2012   Procedure: TOTAL KNEE REVISION;  Surgeon: Garald Balding, MD;  Location: [place];  Service: Orthopedics;  Laterality: Right;  Revision Arthroplasty Right Knee   Social History   Occupational History   Not on file  Tobacco Use   Smoking status: Former    Types: Cigarettes    Quit date: 07/05/1992    Years since quitting: 29.7   Smokeless tobacco: Never  Vaping Use   Vaping Use: Never used  Substance and Sexual Activity   Alcohol use: No    Comment: recovering addict   Drug use: No    Comment: clean since 03/13/1984   Sexual activity: Not on file

## 2022-04-17 ENCOUNTER — Ambulatory Visit (INDEPENDENT_AMBULATORY_CARE_PROVIDER_SITE_OTHER): Payer: PRIVATE HEALTH INSURANCE | Admitting: Orthopaedic Surgery

## 2022-04-17 ENCOUNTER — Encounter: Payer: Self-pay | Admitting: Orthopaedic Surgery

## 2022-04-17 VITALS — BP 147/89 | HR 91 | Ht 66.0 in | Wt 233.0 lb

## 2022-04-17 DIAGNOSIS — Z96642 Presence of left artificial hip joint: Secondary | ICD-10-CM

## 2022-04-17 NOTE — Progress Notes (Signed)
Post-Op Visit Note   Patient: Roy [name]           Date of Birth: 1955/10/14           MRN: [id number] Visit Date: 04/17/2022 PCP: Christain Sacramento, MD   Assessment & Plan: Follow-up left total hip arthroplasty with bilateral knee arthrofibrosis 1 from the right total knee arthroplasty with postop debridement and manipulation but despite this he has extensive are for arthrofibrosis of both knees.  Left knee had tibial plateau fracture with arthrofibrosis.  Total hip arthroplasty on the left was from anterior approach she has a tiny crack in the trochanter nondisplaced.  Uses a lift chair goes from supine position to standing up since he does not have knee flexion other than maybe 10 to 15 degrees.  He is weaning his pain medication and I will recheck him in 2 months.  He will need poly exchange on the right hip sometime this summer and he will let us know when he is ready to proceed with that.  He would like next Rx to be Tylenol # 3     Chief Complaint:  Chief Complaint  Patient presents with   Left Hip - Routine Post Op    03/11/2022 Left THA   Visit Diagnoses:  1. Hx of total hip arthroplasty, left     Plan: Turn 2 months.  AP pelvis single x-ray on return.[id number]  Follow-Up Instructions: Return in about 2 months (around 06/16/2022).   Orders:  No orders of the defined types were placed in this encounter.  No orders of the defined types were placed in this encounter.   Imaging: No results found.  PMFS History: Patient Active Problem List   Diagnosis Date Noted   OA (osteoarthritis) of hip 03/11/2022   Hx of total hip arthroplasty, left 03/11/2022   History of total hip arthroplasty, right 02/06/2022   Elbow fracture, left 11/24/2019   Olecranon fracture, left, closed, initial encounter 11/21/2019   Leg laceration, left, initial encounter 11/21/2019   Unilateral primary osteoarthritis, left hip 06/29/2019   Osteomyelitis of knee region 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  Past Surgical History:  Procedure Laterality Date   EXCISIONAL TOTAL KNEE ARTHROPLASTY WITH ANTIBIOTIC SPACERS Right 07/07/2012   Procedure: EXCISIONAL TOTAL KNEE ARTHROPLASTY WITH ANTIBIOTIC SPACERS;  Surgeon: Garald Balding, MD;  Location: [place];  Service: Orthopedics;  Laterality: Right;  Irrigation and Debridement Right Total Knee Replacement, Insertion of ABX Spacer   KNEE ARTHROSCOPY Right 07/04/2012   Procedure: ARTHROSCOPY I&D KNEE;  Surgeon: Newt Minion, MD;  Location: [place];  Service: Orthopedics;  Laterality: Right;   ORIF ELBOW FRACTURE Left 11/24/2019   Procedure: BIOPSY, OPEN REDUCTION INTERNAL FIXATION (ORIF) LEFT ELBOW/OLECRANON FRACTURE;  Surgeon: Marybelle Killings, MD;  Location: [place];  Service: Orthopedics;  Laterality: Left;   ORIF TIBIA FRACTURE  2008   TOTAL HIP ARTHROPLASTY Right 2001   TOTAL HIP ARTHROPLASTY Left 03/11/2022   Procedure: LEFT TOTAL HIP ARTHROPLASTY;  Surgeon: Marybelle Killings, MD;  Location: [place];  Service: Orthopedics;  Laterality: Left;  Needs RNFA   TOTAL KNEE ARTHROPLASTY Right 09/12/2012   redo  from 2002 sugery   TOTAL KNEE REVISION Right 09/13/2012   Procedure: TOTAL KNEE REVISION;  Surgeon: Garald Balding, MD;  Location: [place];  Service: Orthopedics;  Laterality: Right;  Revision Arthroplasty Right Knee   Social History   Occupational History   Not on file  Tobacco Use   Smoking status: Former    Types: Cigarettes    Quit date: 07/05/1992    Years since quitting: 29.8   Smokeless tobacco: Never  Vaping Use   Vaping Use: Never used  Substance and Sexual Activity   Alcohol use: No    Comment: recovering addict   Drug use: No    Comment: clean since 03/13/1984   Sexual  activity: Not on file

## 2022-04-20 ENCOUNTER — Telehealth: Payer: Self-pay | Admitting: Orthopaedic Surgery

## 2022-04-20 NOTE — Telephone Encounter (Signed)
I called Kim and advised.

## 2022-04-20 NOTE — Telephone Encounter (Signed)
Received call from [place] (OT) with [place] verbal orders for (HHOT) 1 Wk 1 and 2 Wk 2) The number to contact Joelene Millin is [phone]

## 2022-04-20 NOTE — Telephone Encounter (Signed)
Kim from OT called requesting verbal orders 1 week 1, 2 week 2 for OT  callback number [phone]

## 2022-04-20 NOTE — Telephone Encounter (Signed)
Duplicate note in chart. OT approved. Kim aware.

## 2022-04-21 ENCOUNTER — Telehealth: Payer: Self-pay | Admitting: Orthopaedic Surgery

## 2022-04-21 NOTE — Telephone Encounter (Signed)
I called Cara and advised.

## 2022-04-21 NOTE — Telephone Encounter (Signed)
[place] called in requesting verbal orders for 1 week 3 for nursing following Surgery Callback [phone] okay to leave VM Moshe [place]is[place]

## 2022-04-21 NOTE — Telephone Encounter (Signed)
Ok for orders? 

## 2022-05-06 ENCOUNTER — Telehealth: Payer: Self-pay | Admitting: Orthopaedic Surgery

## 2022-05-06 NOTE — Telephone Encounter (Signed)
Roy [name] (OT) from [facility] called requesting to extend OT for 1wk 1 lower body ADL and I ADL. Kimberly secure phone number is [phone]. If unable to answer leave detailed message

## 2022-05-06 NOTE — Telephone Encounter (Signed)
Lvm advising  

## 2022-05-08 ENCOUNTER — Telehealth: Payer: Self-pay | Admitting: Orthopaedic Surgery

## 2022-05-08 NOTE — Telephone Encounter (Signed)
PT eval was today patient is doing well requesting 1 week 4 please advise [name] [phone] Faythe Ghee to Leave message

## 2022-05-11 NOTE — Telephone Encounter (Signed)
I called [name] and gave verbal orders.

## 2022-06-19 ENCOUNTER — Encounter: Payer: Self-pay | Admitting: Orthopaedic Surgery

## 2022-06-19 ENCOUNTER — Other Ambulatory Visit: Payer: Self-pay

## 2022-06-19 ENCOUNTER — Ambulatory Visit: Payer: Medicare PPO | Admitting: Orthopaedic Surgery

## 2022-06-19 VITALS — BP 163/77 | HR 87 | Ht 66.0 in | Wt 233.0 lb

## 2022-06-19 DIAGNOSIS — Z96641 Presence of right artificial hip joint: Secondary | ICD-10-CM | POA: Diagnosis not present

## 2022-06-19 DIAGNOSIS — Z96642 Presence of left artificial hip joint: Secondary | ICD-10-CM

## 2022-06-19 DIAGNOSIS — Z96643 Presence of artificial hip joint, bilateral: Secondary | ICD-10-CM

## 2022-06-19 NOTE — Progress Notes (Signed)
Office Visit Note   Patient: Roy [name]           Date of Birth: [birth year]           MRN: [id number] Visit Date: 06/19/2022              Requested by: [name], MD [address] [place],  [place] [address] PCP: [name], MD   Assessment & Plan: Visit Diagnoses:  1. Hx of total hip arthroplasty, left   2. History of total hip arthroplasty, right     Plan: Patient will continue to work on strengthening left hip.  He has minimal knee flexion from severe bilateral knee arthrofibrosis that had manipulation multiple times arthroscopic debridement with recurrent arthrofibrosis.  Patient's father had the same problem with his knees.  Patient has longstanding right total of arthroplasty from years ago with less than a millimeter poly left superior lateral with eccentric migration of the head.  He is symptomatic on the right hip and he can return in couple months we can discuss proceeding with right hip poly exchange and hip ball exchange.  He is happy with the results of the left total hip arthroplasty.  Follow-Up Instructions: Return if symptoms worsen or fail to improve.   Orders:  Orders Placed This Encounter  Procedures   XR Pelvis 1-2 Views   No orders of the defined types were placed in this encounter.     Procedures: No procedures performed   Clinical Data: No additional findings.   Subjective: Chief Complaint  Patient presents with   Left Hip - Follow-up    03/11/2022 Left THA    HPI 67 year old male returns post left total hip arthroplasty 03/11/2022.  He is walking better ambulating with a rolling walker today.  He walks with a cane at home sometimes without any assistive device and is off narcotic medication just using some ibuprofen occasionally.  He is happy the results of surgery.  Review of Systems all systems noncontributory to HPI.   Objective: Vital Signs: BP (!) 163/77   Pulse 87   Ht 5\' 6"  (1.676 m)   Wt 233 lb (105.7 kg)    BMI 37.61 kg/m   Physical Exam Constitutional:      Appearance: He is well-developed.  HENT:     Head: Normocephalic and atraumatic.     Right Ear: External ear normal.     Left Ear: External ear normal.  Eyes:     Pupils: Pupils are equal, round, and reactive to light.  Neck:     Thyroid: No thyromegaly.     Trachea: No tracheal deviation.  Cardiovascular:     Rate and Rhythm: Normal rate.  Pulmonary:     Effort: Pulmonary effort is normal.     Breath sounds: No wheezing.  Abdominal:     General: Bowel sounds are normal.     Palpations: Abdomen is soft.  Musculoskeletal:     Cervical back: Neck supple.  Skin:    General: Skin is warm and dry.     Capillary Refill: Capillary refill takes less than 2 seconds.  Neurological:     Mental Status: He is alert and oriented to person, place, and time.  Psychiatric:        Behavior: Behavior normal.        Thought Content: Thought content normal.        Judgment: Judgment normal.     Ortho Exam healed left total hip arthroplasty.  Midline right and left  total knee arthroplasty incision.  He ambulates with trace Trendelenburg gait leg lengths are equal.  No pain with logroll of the left hip. Bilateral knee arthrofibrosis unchanged.   Specialty Comments:  No specialty comments available.  Imaging: No results found.   PMFS History: Patient Active Problem List   Diagnosis Date Noted   OA (osteoarthritis) of hip 03/11/2022   Hx of total hip arthroplasty, left 03/11/2022   History of total hip arthroplasty, right 02/06/2022   Elbow fracture, left 11/24/2019   Olecranon fracture, left, closed, initial encounter 11/21/2019   Leg laceration, left, initial encounter 11/21/2019   Unilateral primary osteoarthritis, left hip 06/29/2019   Osteomyelitis of knee region [facility]) 08/03/2012   Psoriatic arthritis, 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   Past Medical History:  Diagnosis Date   Arthritis    Drug addict ([facility])    clean since  03/13/1984   Osteoarthritis of hip    left   Psoriatic arthritis ([facility])    Seasonal allergies     No family history on file.  Past Surgical History:  Procedure Laterality Date   EXCISIONAL TOTAL KNEE ARTHROPLASTY WITH ANTIBIOTIC SPACERS Right 07/07/2012   Procedure: EXCISIONAL TOTAL KNEE ARTHROPLASTY WITH ANTIBIOTIC SPACERS;  Surgeon: Valeria Batman, MD;  [facility];  Service: Orthopedics;  Laterality: Right;  Irrigation and Debridement Right Total Knee Replacement, Insertion of ABX Spacer   KNEE ARTHROSCOPY Right 07/04/2012   Procedure: ARTHROSCOPY I&D KNEE;  Surgeon: Nadara Mustard, MD;  [facility];  Service: Orthopedics;  Laterality: Right;   ORIF ELBOW FRACTURE Left 11/24/2019   Procedure: BIOPSY, OPEN REDUCTION INTERNAL FIXATION (ORIF) LEFT ELBOW/OLECRANON FRACTURE;  Surgeon: Eldred Manges, MD;  [facility];  Service: Orthopedics;  Laterality: Left;   ORIF TIBIA FRACTURE  2008   TOTAL HIP ARTHROPLASTY Right 2001   TOTAL HIP ARTHROPLASTY Left 03/11/2022   Procedure: LEFT TOTAL HIP ARTHROPLASTY;  Surgeon: Eldred Manges, MD;  [facility];  Service: Orthopedics;  Laterality: Left;  Needs RNFA   TOTAL KNEE ARTHROPLASTY Right 09/12/2012   redo from 2002 sugery   TOTAL KNEE REVISION Right 09/13/2012   Procedure: TOTAL KNEE REVISION;  Surgeon: Valeria Batman, MD;  Location: [facility];  Service: Orthopedics;  Laterality: Right;  Revision Arthroplasty Right Knee   Social History   Occupational History   Not on file  Tobacco Use   Smoking status: Former    Types: Cigarettes    Quit date: 07/05/1992    Years since quitting: 29.9   Smokeless tobacco: Never  Vaping Use   Vaping Use: Never used  Substance and Sexual Activity   Alcohol use: No    Comment: recovering addict   Drug use: No    Comment: clean since 03/13/1984   Sexual activity: Not on file

## 2022-06-29 ENCOUNTER — Telehealth: Payer: Self-pay | Admitting: Orthopaedic Surgery

## 2022-06-29 MED ORDER — METHOCARBAMOL 500 MG PO TABS: 500.0000 mg | ORAL_TABLET | Freq: Four times a day (QID) | ORAL | 0 refills | Status: DC | PRN

## 2022-06-29 NOTE — Telephone Encounter (Signed)
Sent to pharmacy. Patient advised.  

## 2022-06-29 NOTE — Telephone Encounter (Signed)
Please advise 

## 2022-06-29 NOTE — Telephone Encounter (Signed)
Patient called needing Rx refilled methocarbamol. Patient uses [facility] [address] The number to contact patient is [phone]

## 2022-07-24 ENCOUNTER — Encounter: Payer: Self-pay | Admitting: Orthopaedic Surgery

## 2022-07-24 ENCOUNTER — Ambulatory Visit (INDEPENDENT_AMBULATORY_CARE_PROVIDER_SITE_OTHER): Payer: Medicare PPO | Admitting: Orthopaedic Surgery

## 2022-07-24 VITALS — Ht 66.0 in | Wt 215.0 lb

## 2022-07-24 DIAGNOSIS — M6281 Muscle weakness (generalized): Secondary | ICD-10-CM | POA: Diagnosis not present

## 2022-07-24 DIAGNOSIS — Z96642 Presence of left artificial hip joint: Secondary | ICD-10-CM

## 2022-07-26 DIAGNOSIS — M6281 Muscle weakness (generalized): Secondary | ICD-10-CM | POA: Insufficient documentation

## 2022-07-26 NOTE — Progress Notes (Signed)
Office Visit Note   Patient: Roy [name]           Date of Birth: 19-Jan-1956           MRN: [id number] Visit Date: 07/24/2022              Requested by: [name], MD [address] [place],  [place] [address] PCP: [name], MD   Assessment & Plan: Visit Diagnoses:  1. Hx of total hip arthroplasty, left   2. Weakness of left quadriceps muscle     Plan: Patient has eccentric poly wear right hip and once his left hip is doing great he can return late August to discuss scheduling his poly exchange in September if he desires.  If he starts having symptoms in his right groin prior to that he will call us.  Follow-Up Instructions: No follow-ups on file.   Orders:  No orders of the defined types were placed in this encounter.  No orders of the defined types were placed in this encounter.     Procedures: No procedures performed   Clinical Data: No additional findings.   Subjective: Chief Complaint  Patient presents with   Left Hip - Routine Post Op, Follow-up    03/11/2022 Left THA    HPI 67 year old male post left total hip arthroplasty.  He is walking better gets tired but is walking around the house without his walker.  He still needs work on his quad strength we went over several exercises today.  Incision is nicely healed leg lengths are equal.  Review of Systems all other systems are updated unchanged.   Objective: Vital Signs: Ht 5\' 6"  (1.676 m)   Wt 215 lb (97.5 kg)   BMI 34.70 kg/m   Physical Exam Constitutional:      Appearance: He is well-developed.  HENT:     Head: Normocephalic and atraumatic.     Right Ear: External ear normal.     Left Ear: External ear normal.  Eyes:     Pupils: Pupils are equal, round, and reactive to light.  Neck:     Thyroid: No thyromegaly.     Trachea: No tracheal deviation.  Cardiovascular:     Rate and Rhythm: Normal rate.  Pulmonary:     Effort: Pulmonary effort is normal.     Breath sounds:  No wheezing.  Abdominal:     General: Bowel sounds are normal.     Palpations: Abdomen is soft.  Musculoskeletal:     Cervical back: Neck supple.  Skin:    General: Skin is warm and dry.     Capillary Refill: Capillary refill takes less than 2 seconds.  Neurological:     Mental Status: He is alert and oriented to person, place, and time.  Psychiatric:        Behavior: Behavior normal.        Thought Content: Thought content normal.        Judgment: Judgment normal.     Ortho Exam left anterior hip incision well-healed.  He has some giveaway weakness with resisted hand testing pressure at his elbow checking his quad strength on the left side versus his normal right.    Specialty Comments:  No specialty comments available.  Imaging: No results found.   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  Past Surgical History:  Procedure Laterality Date   EXCISIONAL TOTAL KNEE ARTHROPLASTY WITH ANTIBIOTIC SPACERS Right 07/07/2012   Procedure: EXCISIONAL TOTAL KNEE ARTHROPLASTY WITH ANTIBIOTIC SPACERS;  Surgeon: Valeria Batman, MD;  [facility];  Service: Orthopedics;  Laterality: Right;  Irrigation and Debridement Right Total Knee Replacement, Insertion of ABX Spacer   KNEE ARTHROSCOPY Right  07/04/2012   Procedure: ARTHROSCOPY I&D KNEE;  Surgeon: Nadara Mustard, MD;  [facility];  Service: Orthopedics;  Laterality: Right;   ORIF ELBOW FRACTURE Left 11/24/2019   Procedure: BIOPSY, OPEN REDUCTION INTERNAL FIXATION (ORIF) LEFT ELBOW/OLECRANON FRACTURE;  Surgeon: Eldred Manges, MD;  [facility];  Service: Orthopedics;  Laterality: Left;   ORIF TIBIA FRACTURE  2008   TOTAL HIP ARTHROPLASTY Right 2001   TOTAL HIP ARTHROPLASTY Left 03/11/2022   Procedure: LEFT TOTAL HIP ARTHROPLASTY;  Surgeon: Eldred Manges, MD;  [facility];  Service: Orthopedics;  Laterality: Left;  Needs RNFA   TOTAL KNEE ARTHROPLASTY Right 09/12/2012   redo from 2002 sugery   TOTAL KNEE REVISION Right 09/13/2012   Procedure: TOTAL KNEE REVISION;  Surgeon: Valeria Batman, MD;  [facility];  Service: Orthopedics;  Laterality: Right;  Revision Arthroplasty Right Knee   Social History   Occupational History   Not on file  Tobacco Use   Smoking status: Former    Types: Cigarettes    Quit date: 07/05/1992    Years since quitting: 30.0   Smokeless tobacco: Never  Vaping Use   Vaping Use: Never used  Substance and Sexual Activity   Alcohol use: No    Comment: recovering addict   Drug use: No    Comment: clean since 03/13/1984   Sexual activity: Not on file

## 2022-08-01 ENCOUNTER — Telehealth: Payer: Medicare PPO | Admitting: Nurse Practitioner

## 2022-08-01 ENCOUNTER — Other Ambulatory Visit: Payer: Self-pay | Admitting: Orthopaedic Surgery

## 2022-08-01 DIAGNOSIS — M62838 Other muscle spasm: Secondary | ICD-10-CM | POA: Diagnosis not present

## 2022-08-01 DIAGNOSIS — F419 Anxiety disorder, unspecified: Secondary | ICD-10-CM | POA: Diagnosis not present

## 2022-08-01 MED ORDER — HYDROXYZINE HCL 25 MG PO TABS: 25.0000 mg | ORAL_TABLET | Freq: Three times a day (TID) | ORAL | 0 refills | Status: AC | PRN

## 2022-08-01 MED ORDER — METHOCARBAMOL 500 MG PO TABS: 500.0000 mg | ORAL_TABLET | Freq: Four times a day (QID) | ORAL | 0 refills | Status: DC | PRN

## 2022-08-01 NOTE — Progress Notes (Signed)
Virtual Visit Consent   Roy [name], you are scheduled for a virtual visit with Mary-Margaret Daphine Deutscher, FNP, a [facility] provider, today.     Just as with appointments in the office, your consent must be obtained to participate.  Your consent will be active for this visit and any virtual visit you may have with one of our providers in the next 365 days.     If you have a MyChart account, a copy of this consent can be sent to you electronically.  All virtual visits are billed to your insurance company just like a traditional visit in the office.    As this is a virtual visit, video technology does not allow for your provider to perform a traditional examination.  This may limit your provider's ability to fully assess your condition.  If your provider identifies any concerns that need to be evaluated in person or the need to arrange testing (such as labs, EKG, etc.), we will make arrangements to do so.     Although advances in technology are sophisticated, we cannot ensure that it will always work on either your end or our end.  If the connection with a video visit is poor, the visit may have to be switched to a telephone visit.  With either a video or telephone visit, we are not always able to ensure that we have a secure connection.     I need to obtain your verbal consent now.   Are you willing to proceed with your visit today? YES   Roy [name] has provided verbal consent on 08/01/2022 for a virtual visit (video or telephone).   Mary-Margaret Daphine Deutscher, FNP   Date: 08/01/2022 9:11 AM   Virtual Visit via Video Note   I, Mary-Margaret Daphine Deutscher, connected with Roy [name] ([id number], 1955/08/06) on 08/01/22 at 10:00 AM EDT by a video-enabled telemedicine application and verified that I am speaking with the correct person using two identifiers.  Location: Patient: Virtual Visit Location Patient: Home Provider: Virtual Visit Location Provider: Mobile   I discussed the limitations  of evaluation and management by telemedicine and the availability of in person appointments. The patient expressed understanding and agreed to proceed.    History of Present Illness: Roy [name] is a 67 y.o. who identifies as a male who was assigned male at birth, and is being seen today for anxiety .  HPI: Patient calls in needing anxiety meds refilled. He recently has surgery and has not been to see his PCP. He had total hip replacement on Left hip and has been having muscle spasms and needs muscle relaxer filled. Due to surgery he has been very anxious. Takes hydroxyzine, but has ran out.  Anxiety Presents for follow-up visit. Symptoms include irritability, palpitations and panic. Symptoms occur occasionally. The quality of sleep is poor. Nighttime awakenings: none.      Review of Systems  Constitutional:  Positive for irritability.  Cardiovascular:  Positive for palpitations.    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 Take one daily for 4 wks for DVT prophylaxis (Patient not taking: Reported on 06/19/2022), Disp: , Rfl:    fluticasone (FLONASE) 50 MCG/ACT nasal spray, Place 2 sprays into both nostrils at bedtime as  needed for allergies or rhinitis., Disp: , Rfl:    HYDROcodone-acetaminophen (NORCO) 7.5-325 MG tablet, Take 1-2 tablets by mouth every 6 (six) hours as needed for moderate pain., Disp: 30 tablet, Rfl: 0   hydrOXYzine (ATARAX) 25 MG tablet, Take by mouth., Disp: , Rfl:    melatonin 5 MG TABS, Take 15 mg by mouth at bedtime., Disp: , Rfl:    methocarbamol (ROBAXIN) 500 MG tablet, Take 1 tablet (500 mg total) by mouth every 6 (six) hours as needed for muscle spasms., Disp: 20 tablet, Rfl: 0   Multiple Vitamin (MULTIVITAMIN WITH MINERALS) TABS, Take 1 tablet by mouth daily., Disp: , Rfl:    triamcinolone cream (KENALOG) 0.1 %, Apply 1 Application topically as needed (psoriasis)., Disp: , Rfl:   Observations/Objective: Patient is well-developed, well-nourished in no acute distress.  Resting comfortably  at home.  Head is normocephalic, atraumatic.  No labored breathing.  Speech is clear and coherent with logical content.  Patient is alert and oriented at baseline.    Assessment and Plan:  Roy [name] in today with chief complaint of Anxiety   1. Muscle spasms of both lower extremities Moist heat rest - methocarbamol (ROBAXIN) 500 MG tablet; Take 1 tablet (500 mg total) by mouth every 6 (six) hours as needed for muscle spasms.  Dispense: 20 tablet; Refill: 0  2. Anxiety Stress management Needs to see PCP  - hydrOXYzine (ATARAX) 25 MG tablet; Take 1 tablet (25 mg total) by mouth every 8 (eight) hours as needed.  Dispense: 30 tablet; Refill: 0     Follow Up Instructions: I discussed the assessment and treatment plan with the patient. The patient was provided an opportunity to ask questions and all were answered. The patient agreed with the plan and demonstrated an understanding of the instructions.  A copy of instructions were sent to the patient via MyChart.  The patient was advised to call back or seek an in-person evaluation if the symptoms worsen or if the condition fails to  improve as anticipated.  Time:  I spent 13 minutes with the patient via telehealth technology discussing the above problems/concerns.    Mary-Margaret Daphine Deutscher, FNP

## 2022-08-01 NOTE — Patient Instructions (Signed)
Roy [name], thank you for joining Roy Pierini, FNP for today's virtual visit.  While this provider is not your primary care provider (PCP), if your PCP is located in our provider database this encounter information will be shared with them immediately following your visit.   A [facility] MyChart account gives you access to today's visit and all your visits, tests, and labs performed at [facility] " click here if you don't have a [facility] MyChart account or go to [email or web address]  Consent: (Patient) Roy [name] provided verbal consent for this virtual visit at the beginning of the encounter.  Current Medications:  Current Outpatient Medications:    aspirin (BAYER ASPIRIN) 325 MG tablet, Take 1 tablet (325 mg total) by mouth daily. Take one daily for 4 wks for DVT prophylaxis (Patient not taking: Reported on 06/19/2022), Disp: , Rfl:    fluticasone (FLONASE) 50 MCG/ACT nasal spray, Place 2 sprays into both nostrils at bedtime as needed for allergies or rhinitis., Disp: , Rfl:    HYDROcodone-acetaminophen (NORCO) 7.5-325 MG tablet, Take 1-2 tablets by mouth every 6 (six) hours as needed for moderate pain., Disp: 30 tablet, Rfl: 0   hydrOXYzine (ATARAX) 25 MG tablet, Take 1 tablet (25 mg total) by mouth every 8 (eight) hours as needed., Disp: 30 tablet, Rfl: 0   melatonin 5 MG TABS, Take 15 mg by mouth at bedtime., Disp: , Rfl:    methocarbamol (ROBAXIN) 500 MG tablet, Take 1 tablet (500 mg total) by mouth every 6 (six) hours as needed for muscle spasms., Disp: 20 tablet, Rfl: 0   Multiple Vitamin (MULTIVITAMIN WITH MINERALS) TABS, Take 1 tablet by mouth daily., Disp: , Rfl:    triamcinolone cream (KENALOG) 0.1 %, Apply 1 Application topically as needed (psoriasis)., Disp: , Rfl:    Medications ordered in this encounter:  Meds ordered this encounter  Medications   hydrOXYzine (ATARAX) 25 MG tablet    Sig: Take 1 tablet (25 mg total) by mouth every 8  (eight) hours as needed.    Dispense:  30 tablet    Refill:  0    Order Specific Question:   Supervising Provider    Answer:   Merrilee Jansky [[id number]]   methocarbamol (ROBAXIN) 500 MG tablet    Sig: Take 1 tablet (500 mg total) by mouth every 6 (six) hours as needed for muscle spasms.    Dispense:  20 tablet    Refill:  0    Order Specific Question:   Supervising Provider    Answer:   Merrilee Jansky [id number]     *If you need refills on other medications prior to your next appointment, please contact your pharmacy*  Follow-Up: Call back or seek an in-person evaluation if the symptoms worsen or if the condition fails to improve as anticipated.  [facility] [phone]  Other Instructions Needs to follow up with PCP   If you have been instructed to have an in-person evaluation today at a local Urgent Care facility, please use the link below. It will take you to a list of all of our available [facility], including address, phone number and hours of operation. Please do not delay care.  [facility]  If you or a family member do not have a primary care provider, use the link below to schedule a visit and establish care. When you choose a [facility] primary care physician or advanced practice provider, you gain a  long-term partner in health. Find a Primary Care Provider  Learn more about [facility]'s in-office and virtual care options: [facility] Get Care Now

## 2022-08-11 ENCOUNTER — Telehealth: Payer: Self-pay | Admitting: Orthopaedic Surgery

## 2022-08-11 NOTE — Telephone Encounter (Signed)
Pt was told to call the office he is still having problems for his right leg. Pt states Dr Ophelia Charter told him to call. He has burning and severe pains. Possible nerve issues. Please call pt at [phone]. Pt states he need medical advice

## 2022-10-20 ENCOUNTER — Ambulatory Visit: Payer: PRIVATE HEALTH INSURANCE | Admitting: Orthopaedic Surgery

## 2022-11-27 ENCOUNTER — Ambulatory Visit: Payer: PRIVATE HEALTH INSURANCE | Admitting: Orthopaedic Surgery

## 2022-12-25 ENCOUNTER — Ambulatory Visit (INDEPENDENT_AMBULATORY_CARE_PROVIDER_SITE_OTHER): Payer: Medicare PPO | Admitting: Orthopaedic Surgery

## 2022-12-25 ENCOUNTER — Encounter: Payer: Self-pay | Admitting: Orthopaedic Surgery

## 2022-12-25 VITALS — BP 129/84 | HR 82 | Ht 66.0 in | Wt 220.0 lb

## 2022-12-25 DIAGNOSIS — T84010D Broken internal right hip prosthesis, subsequent encounter: Secondary | ICD-10-CM

## 2022-12-25 DIAGNOSIS — Z96641 Presence of right artificial hip joint: Secondary | ICD-10-CM | POA: Diagnosis not present

## 2022-12-25 DIAGNOSIS — Z96649 Presence of unspecified artificial hip joint: Secondary | ICD-10-CM | POA: Insufficient documentation

## 2022-12-25 NOTE — Progress Notes (Signed)
Office Visit Note   Patient: Roy [name]           Date of Birth: 18-May-1955           MRN: [id number] Visit Date: 12/25/2022              Requested by: [name], MD [address] [place],  [place] [address] PCP: [name], MD   Assessment & Plan: Visit Diagnoses:  1. History of total hip arthroplasty, right   2. 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  Plan to be revision of ball and polyethylene liner.  Procedure discussed including increased risk of hip instability and hip dislocation after revision hip surgery.  Questions elicited and answered risk of infection discussed.  He understands request to proceed.  Follow-Up Instructions: No follow-ups on file.   Orders:  No orders of the defined types were placed in this encounter.  No orders of the defined types were placed in this encounter.     Procedures: No procedures performed   Clinical Data: No additional findings.   Subjective: Chief Complaint  Patient presents with   Right Hip - Pain    HPI 67 year old male returns post left total hip arthroplasty 03/11/2022.  Very happy with results of the left total hip and states he can walk now with just a cane in the house he gets around and sometimes does not use his cane.  Opposite right hip still giving him problems where his metal-on-metal from eccentric probably wear likely broken polyliner and some proximal osteolysis around the proximal portion of the stem without a loose stem.  Lucent zone is in zone 1.  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  This is a DePuy acetabulum and femoral stem.  Patient with pain in the right groin and proximal femur on the right with metal-on-metal from 23 years of poly wear.  Patient had left total hip arthroplasty  anterior approach done 03/11/2022 doing well.  Review of Systems past history of total knee arthroplasties bilateral with bilateral knee flexion contractures.  History olecranon fracture.  History of psoriatic arthritis.   Objective: Vital Signs: BP 129/84   Pulse 82   Ht 5\' 6"  (1.676 m)   Wt 220 lb (99.8 kg)   BMI 35.51 kg/m   Physical Exam Constitutional:      Appearance: He is well-developed.  HENT:     Head: Normocephalic and atraumatic.     Right Ear: External ear normal.     Left Ear: External ear normal.  Eyes:     Pupils: Pupils are equal, round, and reactive to light.  Neck:     Thyroid: No thyromegaly.     Trachea: No tracheal deviation.  Cardiovascular:     Rate and Rhythm: Normal rate.  Pulmonary:     Effort: Pulmonary effort is normal.     Breath sounds: No wheezing.  Abdominal:     General: Bowel sounds are normal.     Palpations: Abdomen is soft.  Musculoskeletal:     Cervical back: Neck supple.  Skin:    General: Skin is warm and dry.     Capillary Refill: Capillary refill takes less than 2 seconds.  Neurological:     Mental Status: He  is alert and oriented to person, place, and time.  Psychiatric:        Behavior: Behavior normal.        Thought Content: Thought content normal.        Judgment: Judgment normal.     Ortho Exam healed right total hip arthroplasty posterior approach incision.  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  Specialty Comments:  No specialty comments available.  Imaging: No results found.   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  Past Surgical History:  Procedure Laterality Date   EXCISIONAL TOTAL KNEE ARTHROPLASTY WITH ANTIBIOTIC SPACERS Right 07/07/2012   Procedure: EXCISIONAL TOTAL KNEE ARTHROPLASTY WITH ANTIBIOTIC SPACERS;  Surgeon: Roy Batman, MD;  Location: [facility] OR;  Service: Orthopedics;  Laterality: Right;  Irrigation and Debridement Right Total Knee Replacement, Insertion of ABX Spacer   KNEE ARTHROSCOPY Right 07/04/2012   Procedure: ARTHROSCOPY I&D KNEE;  Surgeon: Nadara Mustard, MD;  Location: [facility] OR;  Service: Orthopedics;  Laterality: Right;   ORIF ELBOW FRACTURE Left 11/24/2019   Procedure: BIOPSY, OPEN REDUCTION INTERNAL FIXATION (ORIF) LEFT ELBOW/OLECRANON FRACTURE;  Surgeon: Roy Manges, MD;  Location: [facility] OR;  Service: Orthopedics;  Laterality: Left;   ORIF TIBIA FRACTURE  2008   TOTAL HIP ARTHROPLASTY Right 2001   TOTAL HIP ARTHROPLASTY Left 03/11/2022   Procedure: LEFT TOTAL HIP ARTHROPLASTY;  Surgeon: Roy Manges, MD;  Location: [facility] OR;  Service: Orthopedics;  Laterality: Left;  Needs RNFA   TOTAL KNEE ARTHROPLASTY Right 09/12/2012   redo from 2002 sugery   TOTAL KNEE REVISION Right 09/13/2012   Procedure: TOTAL KNEE REVISION;  Surgeon: Roy Batman, MD;  Location: [facility] OR;  Service: Orthopedics;  Laterality: Right;  Revision Arthroplasty Right Knee   Social History   Occupational History   Not on file  Tobacco Use   Smoking status: Former    Current packs/day: 0.00    Types: Cigarettes    Quit date: 07/05/1992    Years since quitting: 30.4   Smokeless  tobacco: Never  Vaping Use   Vaping status: Never Used  Substance and Sexual Activity   Alcohol use: No    Comment: recovering addict   Drug use: No    Comment: clean since 03/13/1984   Sexual activity: Not on file

## 2023-01-06 ENCOUNTER — Telehealth: Payer: Self-pay

## 2023-01-06 NOTE — Telephone Encounter (Signed)
I returned patient's call/voice mail to discuss scheduling surgery.  I called and left him a voice mail for return call.

## 2023-01-08 ENCOUNTER — Telehealth: Payer: Self-pay

## 2023-01-08 NOTE — Telephone Encounter (Signed)
Sure I would be happy to see him.  Thank you.

## 2023-01-08 NOTE — Telephone Encounter (Signed)
Patient called me back to discuss scheduling surgery.  He wants to wait until at least mid January.  He is okay to see someone else here to do the surgery.  Who would you recommend?

## 2023-01-11 NOTE — Telephone Encounter (Signed)
Called and scheduled patient to see Dr.Xu on 01/26/2023 at 3:15pm.

## 2023-01-26 ENCOUNTER — Ambulatory Visit: Payer: PRIVATE HEALTH INSURANCE | Admitting: Orthopaedic Surgery

## 2023-02-01 NOTE — Progress Notes (Unsigned)
Office Visit Note   Patient: Roy [name]           Date of Birth: [birth year]           MRN: [id number] Visit Date: 02/02/2023              Requested by: [name], MD [address] [place],  [place] [address] PCP: [name], MD   Assessment & Plan: Visit Diagnoses:  1. Failure of right total hip arthroplasty, subsequent encounter     Plan: ***  Follow-Up Instructions: No follow-ups on file.   Orders:  No orders of the defined types were placed in this encounter.  No orders of the defined types were placed in this encounter.     Procedures: No procedures performed   Clinical Data: No additional findings.   Subjective: No chief complaint on file.   HPI  Review of Systems  Constitutional: Negative.   HENT: Negative.    Eyes: Negative.   Respiratory: Negative.    Cardiovascular: Negative.   Gastrointestinal: Negative.   Endocrine: Negative.   Genitourinary: Negative.   Skin: Negative.   Allergic/Immunologic: Negative.   Neurological: Negative.   Hematological: Negative.   Psychiatric/Behavioral: Negative.    All other systems reviewed and are negative.   Objective: Vital Signs: There were no vitals taken for this visit.  Physical Exam Vitals and nursing note reviewed.  Constitutional:      Appearance: He is well-developed.  Pulmonary:     Effort: Pulmonary effort is normal.  Abdominal:     Palpations: Abdomen is soft.  Skin:    General: Skin is warm.  Neurological:     Mental Status: He is alert and oriented to person, place, and time.  Psychiatric:        Behavior: Behavior normal.        Thought Content: Thought content normal.        Judgment: Judgment normal.   Ortho Exam  Specialty Comments:  No specialty comments available.  Imaging: No results found.   PMFS History: Patient Active Problem List   Diagnosis Date Noted  . 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  . Weakness of left quadriceps  muscle 07/26/2022  . OA (osteoarthritis) of hip 03/11/2022  . Hx of total hip arthroplasty, left 03/11/2022  . History of total hip arthroplasty, right 02/06/2022  . Elbow fracture, left 11/24/2019  . Olecranon fracture, left, closed, initial encounter 11/21/2019  . Leg laceration, left, initial encounter 11/21/2019  . Osteomyelitis of knee region (HCC) 08/03/2012  . 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 Arthritis   . Drug addict (HCC)    clean since 03/13/1984  . Osteoarthritis of hip    left  . Psoriatic arthritis (HCC)   . Seasonal allergies     No family history on file.  Past Surgical History:  Procedure Laterality Date  . EXCISIONAL TOTAL KNEE ARTHROPLASTY WITH ANTIBIOTIC SPACERS Right 07/07/2012   Procedure: EXCISIONAL TOTAL KNEE ARTHROPLASTY WITH ANTIBIOTIC SPACERS;  Surgeon: Valeria Batman, MD;  Location: [facility] OR;  Service: Orthopedics;  Laterality: Right;  Irrigation and Debridement Right Total Knee Replacement, Insertion of ABX Spacer  . KNEE ARTHROSCOPY Right 07/04/2012   Procedure: ARTHROSCOPY I&D KNEE;  Surgeon: Nadara Mustard, MD;  [facility];  Service: Orthopedics;  Laterality: Right;  . ORIF ELBOW FRACTURE Left 11/24/2019   Procedure: BIOPSY, OPEN REDUCTION INTERNAL FIXATION (ORIF)  LEFT ELBOW/OLECRANON FRACTURE;  Surgeon: Eldred Manges, MD;  Location: [facility] OR;  Service: Orthopedics;  Laterality: Left;  . ORIF TIBIA FRACTURE  2008  . TOTAL HIP ARTHROPLASTY Right 2001  . TOTAL HIP ARTHROPLASTY Left 03/11/2022   Procedure: LEFT TOTAL HIP ARTHROPLASTY;  Surgeon: Eldred Manges, MD;  Location: [facility] OR;  Service: Orthopedics;  Laterality: Left;  Needs RNFA  . TOTAL KNEE ARTHROPLASTY Right 09/12/2012   redo from 2002 sugery  . TOTAL KNEE REVISION Right 09/13/2012   Procedure: TOTAL KNEE REVISION;  Surgeon: Valeria Batman, MD;  Location: [facility] OR;  Service: Orthopedics;  Laterality: Right;  Revision Arthroplasty  Right Knee   Social History   Occupational History  . Not on file  Tobacco Use  . Smoking status: Former    Current packs/day: 0.00    Types: Cigarettes    Quit date: 07/05/1992    Years since quitting: 30.5  . Smokeless tobacco: Never  Vaping Use  . Vaping status: Never Used  Substance and Sexual Activity  . Alcohol use: No    Comment: recovering addict  . Drug use: No    Comment: clean since 03/13/1984  . Sexual activity: Not on file

## 2023-02-02 ENCOUNTER — Other Ambulatory Visit (INDEPENDENT_AMBULATORY_CARE_PROVIDER_SITE_OTHER): Payer: Medicare PPO

## 2023-02-02 ENCOUNTER — Ambulatory Visit (INDEPENDENT_AMBULATORY_CARE_PROVIDER_SITE_OTHER): Payer: Medicare PPO | Admitting: Orthopaedic Surgery

## 2023-02-02 VITALS — Ht 66.0 in | Wt 230.0 lb

## 2023-02-02 DIAGNOSIS — T84010D Broken internal right hip prosthesis, subsequent encounter: Secondary | ICD-10-CM

## 2023-02-18 ENCOUNTER — Encounter: Payer: Self-pay | Admitting: Orthopaedic Surgery

## 2023-02-23 ENCOUNTER — Ambulatory Visit
Admission: RE | Admit: 2023-02-23 | Discharge: 2023-02-23 | Disposition: A | Discharge status: Home / Self Care | Payer: Medicare PPO | Source: Ambulatory Visit | Attending: Orthopaedic Surgery | Admitting: Orthopaedic Surgery

## 2023-02-23 DIAGNOSIS — T84010D Broken internal right hip prosthesis, subsequent encounter: Secondary | ICD-10-CM

## 2023-03-07 NOTE — Progress Notes (Signed)
 Do we have preop clearance for him yet?

## 2023-03-18 ENCOUNTER — Telehealth: Payer: Self-pay

## 2023-03-18 NOTE — Telephone Encounter (Signed)
EMAIL FROM PATIENT:  I visited Dr. Roda Shutters before Christmas and talked about replacing my plastic inset in my right hip replacement.  He wanted to do a CT scan.  This was done 03/25/2022.   Since then I was wondering as to when we can schedule my surgery.  The doctor was going to look at it.  I figure I would like to get it in late February or early March.  So, would you kindly check on that for me?  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  I need to be healed and be ready to handle the music.  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  Thank you for your time.  Roy [name]

## 2023-03-30 ENCOUNTER — Encounter: Payer: Self-pay | Admitting: Orthopaedic Surgery

## 2023-04-01 ENCOUNTER — Other Ambulatory Visit: Payer: Self-pay

## 2023-04-28 ENCOUNTER — Other Ambulatory Visit: Payer: Self-pay | Admitting: Physician Assistant

## 2023-04-28 MED ORDER — DOCUSATE SODIUM 100 MG PO CAPS: 100.0000 mg | ORAL_CAPSULE | Freq: Every day | ORAL | 2 refills | Status: AC | PRN

## 2023-04-28 MED ORDER — METHOCARBAMOL 750 MG PO TABS: 750.0000 mg | ORAL_TABLET | Freq: Three times a day (TID) | ORAL | 0 refills | Status: AC | PRN

## 2023-04-28 MED ORDER — ONDANSETRON HCL 4 MG PO TABS: 4.0000 mg | ORAL_TABLET | Freq: Three times a day (TID) | ORAL | 0 refills | Status: AC | PRN

## 2023-04-28 MED ORDER — HYDROCODONE-ACETAMINOPHEN 7.5-325 MG PO TABS: 1.0000 | ORAL_TABLET | Freq: Four times a day (QID) | ORAL | 0 refills | Status: DC | PRN

## 2023-04-28 MED ORDER — ASPIRIN 81 MG PO CHEW: 81.0000 mg | CHEWABLE_TABLET | Freq: Two times a day (BID) | ORAL | 0 refills | Status: DC

## 2023-04-29 NOTE — Pre-Procedure Instructions (Signed)
 Surgical Instructions   Your procedure is scheduled on Monday, March 17th. Report to [facility] Main Entrance "A" at 07:45 A.M., then check in with the Admitting office. Any questions or running late day of surgery: call [phone]  Questions prior to your surgery date: call [phone], Monday-Friday, 8am-4pm. If you experience any cold or flu symptoms such as cough, fever, chills, shortness of breath, etc. between now and your scheduled surgery, please notify us at the above number.     Remember:  Do not eat after midnight the night before your surgery  You may drink clear liquids until 07:15 AM the morning of your surgery.   Clear liquids allowed are: Water, Non-Citrus Juices (without pulp), Carbonated Beverages, Clear Tea (no milk, honey, etc.), Black Coffee Only (NO MILK, CREAM OR POWDERED CREAMER of any kind), and Gatorade.  Patient Instructions  The night before surgery:  No food after midnight. ONLY clear liquids after midnight  The day of surgery (if you do NOT have diabetes):  Drink ONE (1) Pre-Surgery Clear Ensure by 07:15 AM the morning of surgery. Drink in one sitting. Do not sip.  This drink was given to you during your hospital  pre-op appointment visit.  Nothing else to drink after completing the  Pre-Surgery Clear Ensure.         If you have questions, please contact your surgeon's office.    Take these medicines the morning of surgery with A SIP OF WATER  May take these medicines IF NEEDED: ondansetron [facility])   Follow your surgeon's instructions on when to stop Aspirin.  If no instructions were given by your surgeon then you will need to call the office to get those instructions.    One week prior to surgery, STOP taking any Aleve, Naproxen, Ibuprofen, Motrin, Advil, Goody's, BC's, all herbal medications, fish oil, and non-prescription vitamins.                     Do NOT Smoke (Tobacco/Vaping) for 24 hours prior to your procedure.  If you use a CPAP  at night, you may bring your mask/headgear for your overnight stay.   You will be asked to remove any contacts, glasses, piercing's, hearing aid's, dentures/partials prior to surgery. Please bring cases for these items if needed.    Patients discharged the day of surgery will not be allowed to drive home, and someone needs to stay with them for 24 hours.  SURGICAL WAITING ROOM VISITATION Patients may have no more than 2 support people in the waiting area - these visitors may rotate.   Pre-op nurse will coordinate an appropriate time for 1 ADULT support person, who may not rotate, to accompany patient in pre-op.  Children under the age of 15 must have an adult with them who is not the patient and must remain in the main waiting area with an adult.  If the patient needs to stay at the hospital during part of their recovery, the visitor guidelines for inpatient rooms apply.  Please refer to the [facility] website for the visitor guidelines for any additional information.   If you received a COVID test during your pre-op visit  it is requested that you wear a mask when out in public, stay away from anyone that may not be feeling well and notify your surgeon if you develop symptoms. If you have been in contact with anyone that has tested positive in the last 10 days please notify you surgeon.  Pre-operative 5 CHG Bathing Instructions   You can play a key role in reducing the risk of infection after surgery. Your skin needs to be as free of germs as possible. You can reduce the number of germs on your skin by washing with CHG (chlorhexidine gluconate) soap before surgery. CHG is an antiseptic soap that kills germs and continues to kill germs even after washing.   DO NOT use if you have an allergy to chlorhexidine/CHG or antibacterial soaps. If your skin becomes reddened or irritated, stop using the CHG and notify one of our RNs at [phone].   Please shower with the CHG soap starting 4  days before surgery using the following schedule:     Please keep in mind the following:  DO NOT shave, including legs and underarms, starting the day of your first shower.   You may shave your face at any point before/day of surgery.  Place clean sheets on your bed the day you start using CHG soap. Use a clean washcloth (not used since being washed) for each shower. DO NOT sleep with pets once you start using the CHG.   CHG Shower Instructions:  Wash your face and private area with normal soap. If you choose to wash your hair, wash first with your normal shampoo.  After you use shampoo/soap, rinse your hair and body thoroughly to remove shampoo/soap residue.  Turn the water OFF and apply about 3 tablespoons (45 ml) of CHG soap to a CLEAN washcloth.  Apply CHG soap ONLY FROM YOUR NECK DOWN TO YOUR TOES (washing for 3-5 minutes)  DO NOT use CHG soap on face, private areas, open wounds, or sores.  Pay special attention to the area where your surgery is being performed.  If you are having back surgery, having someone wash your back for you may be helpful. Wait 2 minutes after CHG soap is applied, then you may rinse off the CHG soap.  Pat dry with a clean towel  Put on clean clothes/pajamas   If you choose to wear lotion, please use ONLY the CHG-compatible lotions that are listed below.  Additional instructions for the day of surgery: DO NOT APPLY any lotions, deodorants, cologne, or perfumes.   Do not bring valuables to the hospital. [facility] is not responsible for any belongings/valuables. Do not wear nail polish, gel polish, artificial nails, or any other type of covering on natural nails (fingers and toes) Do not wear jewelry or makeup Put on clean/comfortable clothes.  Please brush your teeth.  Ask your nurse before applying any prescription medications to the skin.     CHG Compatible Lotions   Aveeno Moisturizing lotion  Cetaphil Moisturizing Cream  Cetaphil Moisturizing  Lotion  Clairol Herbal Essence Moisturizing Lotion, Dry Skin  Clairol Herbal Essence Moisturizing Lotion, Extra Dry Skin  Clairol Herbal Essence Moisturizing Lotion, Normal Skin  Curel Age Defying Therapeutic Moisturizing Lotion with Alpha Hydroxy  Curel Extreme Care Body Lotion  Curel Soothing Hands Moisturizing Hand Lotion  Curel Therapeutic Moisturizing Cream, Fragrance-Free  Curel Therapeutic Moisturizing Lotion, Fragrance-Free  Curel Therapeutic Moisturizing Lotion, Original Formula  Eucerin Daily Replenishing Lotion  Eucerin Dry Skin Therapy Plus Alpha Hydroxy Crme  Eucerin Dry Skin Therapy Plus Alpha Hydroxy Lotion  Eucerin Original Crme  Eucerin Original Lotion  Eucerin Plus Crme Eucerin Plus Lotion  Eucerin TriLipid Replenishing Lotion  Keri Anti-Bacterial Hand Lotion  Keri Deep Conditioning Original Lotion Dry Skin Formula Softly Scented  Keri Deep Conditioning Original Lotion, Fragrance Free Sensitive  Skin Formula  Keri Lotion Fast Absorbing Fragrance Free Sensitive Skin Formula  Keri Lotion Fast Absorbing Softly Scented Dry Skin Formula  Keri Original Lotion  Keri Skin Renewal Lotion Keri Silky Smooth Lotion  Keri Silky Smooth Sensitive Skin Lotion  Nivea Body Creamy Conditioning Oil  Nivea Body Extra Enriched Lotion  Nivea Body Original Lotion  Nivea Body Sheer Moisturizing Lotion Nivea Crme  Nivea Skin Firming Lotion  NutraDerm 30 Skin Lotion  NutraDerm Skin Lotion  NutraDerm Therapeutic Skin Cream  NutraDerm Therapeutic Skin Lotion  ProShield Protective Hand Cream  Provon moisturizing lotion  Please read over the following fact sheets that you were given.

## 2023-04-30 ENCOUNTER — Other Ambulatory Visit: Payer: Self-pay

## 2023-04-30 ENCOUNTER — Encounter (HOSPITAL_COMMUNITY)
Admission: RE | Admit: 2023-04-30 | Discharge: 2023-04-30 | Disposition: A | Discharge status: Home / Self Care | Source: Ambulatory Visit | Attending: Orthopaedic Surgery | Admitting: Orthopaedic Surgery

## 2023-04-30 ENCOUNTER — Encounter (HOSPITAL_COMMUNITY): Payer: Self-pay

## 2023-04-30 VITALS — BP 144/76 | HR 74 | Temp 98.1°F | Resp 17 | Ht 66.0 in | Wt 236.0 lb

## 2023-04-30 DIAGNOSIS — T84010A Broken internal right hip prosthesis, initial encounter: Secondary | ICD-10-CM | POA: Diagnosis not present

## 2023-04-30 DIAGNOSIS — Y798 Miscellaneous orthopedic devices associated with adverse incidents, not elsewhere classified: Secondary | ICD-10-CM | POA: Diagnosis not present

## 2023-04-30 DIAGNOSIS — Z01812 Encounter for preprocedural laboratory examination: Secondary | ICD-10-CM | POA: Diagnosis present

## 2023-04-30 DIAGNOSIS — Y838 Other surgical procedures as the cause of abnormal reaction of the patient, or of later complication, without mention of misadventure at the time of the procedure: Secondary | ICD-10-CM | POA: Diagnosis not present

## 2023-04-30 DIAGNOSIS — Z01818 Encounter for other preprocedural examination: Secondary | ICD-10-CM

## 2023-04-30 DIAGNOSIS — T84010D Broken internal right hip prosthesis, subsequent encounter: Secondary | ICD-10-CM

## 2023-04-30 HISTORY — DX: Gastro-esophageal reflux disease without esophagitis: K21.9

## 2023-04-30 LAB — BASIC METABOLIC PANEL
Anion gap: 12 (ref 5–15)
BUN: 18 mg/dL (ref 8–23)
CO2: 26 mmol/L (ref 22–32)
Calcium: 9.2 mg/dL (ref 8.9–10.3)
Chloride: 99 mmol/L (ref 98–111)
Creatinine, Ser: 0.68 mg/dL (ref 0.61–1.24)
GFR, Estimated: >60 mL/min (ref >60)
Glucose, Bld: 87 mg/dL (ref 70–99)
Potassium: 5.1 mmol/L (ref 3.5–5.1)
Sodium: 137 mmol/L (ref 135–145)

## 2023-04-30 LAB — CBC
HCT: 48.8 % (ref 39.0–52.0)
Hemoglobin: 16.4 g/dL (ref 13.0–17.0)
MCH: 27.2 pg (ref 26.0–34.0)
MCHC: 33.6 g/dL (ref 30.0–36.0)
MCV: 80.9 fL (ref 80.0–100.0)
Platelets: 287 10*3/uL (ref 150–400)
RBC: 6.03 MIL/uL — ABNORMAL HIGH (ref 4.22–5.81)
RDW: 13.1 % (ref 11.5–15.5)
WBC: 9.5 10*3/uL (ref 4.0–10.5)
nRBC: 0 % (ref 0.0–0.2)

## 2023-04-30 LAB — TYPE AND SCREEN
ABO/RH(D): B POS
Antibody Screen: NEGATIVE

## 2023-04-30 LAB — SURGICAL PCR SCREEN
MRSA, PCR: NEGATIVE
Staphylococcus aureus: POSITIVE — AB

## 2023-04-30 NOTE — Progress Notes (Signed)
 PCP - Dr. Benedetto Goad Cardiologist - denies  PPM/ICD - denies   Chest x-ray - 11/24/19 EKG - 03/15/23-CE-(n/a) Stress Test - denies ECHO - denies Cardiac Cath - denies  Sleep Study - denies   DM- denies  Last dose of GLP1 agonist-  n/a   Blood Thinner Instructions: n/a Aspirin Instructions: f/u with surgeon  ERAS Protcol - clears until 0715 PRE-SURGERY Ensure given  COVID TEST- n/a   Anesthesia review: no  Patient denies shortness of breath, fever, cough and chest pain at PAT appointment   All instructions explained to the patient, with a verbal understanding of the material. Patient agrees to go over the instructions while at home for a better understanding. The opportunity to ask questions was provided.

## 2023-05-07 MED ORDER — TRANEXAMIC ACID 1000 MG/10ML IV SOLN
2000.0000 mg | INTRAVENOUS | Status: AC
  Filled 2023-05-07: qty 20

## 2023-05-10 ENCOUNTER — Inpatient Hospital Stay (HOSPITAL_COMMUNITY)

## 2023-05-10 ENCOUNTER — Encounter (HOSPITAL_COMMUNITY)
Admission: RE | Disposition: A | Discharge status: Skilled Nursing Facility | Payer: Self-pay | Source: Home / Self Care | Attending: Orthopaedic Surgery

## 2023-05-10 ENCOUNTER — Encounter (HOSPITAL_COMMUNITY): Payer: Self-pay | Admitting: Orthopaedic Surgery

## 2023-05-10 ENCOUNTER — Inpatient Hospital Stay (HOSPITAL_COMMUNITY)
Admission: RE | Admit: 2023-05-10 | Discharge: 2023-05-11 | DRG: 468 | Disposition: A | Discharge status: Skilled Nursing Facility | Payer: 59 | Attending: Orthopaedic Surgery | Admitting: Orthopaedic Surgery

## 2023-05-10 ENCOUNTER — Other Ambulatory Visit: Payer: Self-pay

## 2023-05-10 DIAGNOSIS — Z7982 Long term (current) use of aspirin: Secondary | ICD-10-CM

## 2023-05-10 DIAGNOSIS — Z885 Allergy status to narcotic agent status: Secondary | ICD-10-CM | POA: Diagnosis not present

## 2023-05-10 DIAGNOSIS — M2170 Unequal limb length (acquired), unspecified site: Secondary | ICD-10-CM | POA: Diagnosis present

## 2023-05-10 DIAGNOSIS — Y792 Prosthetic and other implants, materials and accessory orthopedic devices associated with adverse incidents: Secondary | ICD-10-CM | POA: Diagnosis present

## 2023-05-10 DIAGNOSIS — Z87891 Personal history of nicotine dependence: Secondary | ICD-10-CM | POA: Diagnosis not present

## 2023-05-10 DIAGNOSIS — T84010D Broken internal right hip prosthesis, subsequent encounter: Principal | ICD-10-CM

## 2023-05-10 DIAGNOSIS — K219 Gastro-esophageal reflux disease without esophagitis: Secondary | ICD-10-CM | POA: Diagnosis present

## 2023-05-10 DIAGNOSIS — Z96641 Presence of right artificial hip joint: Secondary | ICD-10-CM | POA: Diagnosis present

## 2023-05-10 DIAGNOSIS — M1990 Unspecified osteoarthritis, unspecified site: Secondary | ICD-10-CM | POA: Diagnosis present

## 2023-05-10 DIAGNOSIS — Z96642 Presence of left artificial hip joint: Secondary | ICD-10-CM | POA: Diagnosis present

## 2023-05-10 DIAGNOSIS — T84050A Periprosthetic osteolysis of internal prosthetic right hip joint, initial encounter: Secondary | ICD-10-CM | POA: Diagnosis present

## 2023-05-10 DIAGNOSIS — T84060A Wear of articular bearing surface of internal prosthetic right hip joint, initial encounter: Secondary | ICD-10-CM

## 2023-05-10 DIAGNOSIS — Z79899 Other long term (current) drug therapy: Secondary | ICD-10-CM | POA: Diagnosis not present

## 2023-05-10 HISTORY — PX: TOTAL HIP REVISION: SHX763

## 2023-05-10 SURGERY — TOTAL HIP REVISION
Anesthesia: Spinal | Site: Hip | Laterality: Right

## 2023-05-10 MED ORDER — MEPERIDINE HCL 25 MG/ML IJ SOLN: 6.2500 mg | INTRAMUSCULAR | Status: DC | PRN

## 2023-05-10 MED ORDER — ACETAMINOPHEN 325 MG PO TABS: 325.0000 mg | ORAL_TABLET | Freq: Once | ORAL | Status: DC | PRN

## 2023-05-10 MED ORDER — MIDAZOLAM HCL 2 MG/2ML IJ SOLN
INTRAMUSCULAR | Status: DC | PRN
  Administered 2023-05-10: 2 mg via INTRAVENOUS

## 2023-05-10 MED ORDER — DEXAMETHASONE SODIUM PHOSPHATE 10 MG/ML IJ SOLN
INTRAMUSCULAR | Status: DC | PRN
  Administered 2023-05-10: 10 mg via INTRAVENOUS

## 2023-05-10 MED ORDER — VANCOMYCIN HCL 1000 MG IV SOLR
INTRAVENOUS | Status: DC | PRN
  Administered 2023-05-10: 1000 mg

## 2023-05-10 MED ORDER — CEFAZOLIN SODIUM-DEXTROSE 2-4 GM/100ML-% IV SOLN
2.0000 g | INTRAVENOUS | Status: AC
  Administered 2023-05-10: 2 g via INTRAVENOUS
  Filled 2023-05-10: qty 100

## 2023-05-10 MED ORDER — EPHEDRINE 5 MG/ML INJ
INTRAVENOUS | Status: AC
  Filled 2023-05-10: qty 5

## 2023-05-10 MED ORDER — DOCUSATE SODIUM 100 MG PO CAPS
100.0000 mg | ORAL_CAPSULE | Freq: Two times a day (BID) | ORAL | Status: DC
  Administered 2023-05-10 – 2023-05-11 (×3): 100 mg via ORAL
  Filled 2023-05-10 (×3): qty 1

## 2023-05-10 MED ORDER — HYDROCODONE-ACETAMINOPHEN 7.5-325 MG PO TABS
1.0000 | ORAL_TABLET | ORAL | Status: DC | PRN
  Administered 2023-05-10 (×2): 1 via ORAL
  Administered 2023-05-11: 2 via ORAL
  Filled 2023-05-10 (×2): qty 2

## 2023-05-10 MED ORDER — PHENYLEPHRINE HCL-NACL 20-0.9 MG/250ML-% IV SOLN
INTRAVENOUS | Status: DC | PRN
  Administered 2023-05-10: 25 ug/min via INTRAVENOUS

## 2023-05-10 MED ORDER — CHLORHEXIDINE GLUCONATE 4 % EX SOLN: 1.0000 | CUTANEOUS | 1 refills | Status: AC

## 2023-05-10 MED ORDER — ONDANSETRON HCL 4 MG/2ML IJ SOLN
INTRAMUSCULAR | Status: AC
  Filled 2023-05-10: qty 2

## 2023-05-10 MED ORDER — ACETAMINOPHEN 500 MG PO TABS
500.0000 mg | ORAL_TABLET | Freq: Four times a day (QID) | ORAL | Status: DC
  Administered 2023-05-10 – 2023-05-11 (×3): 500 mg via ORAL
  Filled 2023-05-10 (×4): qty 1

## 2023-05-10 MED ORDER — EPHEDRINE SULFATE-NACL 50-0.9 MG/10ML-% IV SOSY
PREFILLED_SYRINGE | INTRAVENOUS | Status: DC | PRN
  Administered 2023-05-10 (×2): 5 mg via INTRAVENOUS

## 2023-05-10 MED ORDER — POLYETHYLENE GLYCOL 3350 17 G PO PACK
17.0000 g | PACK | Freq: Every day | ORAL | Status: DC
  Administered 2023-05-11: 17 g via ORAL
  Filled 2023-05-10 (×2): qty 1

## 2023-05-10 MED ORDER — ONDANSETRON HCL 4 MG/2ML IJ SOLN
INTRAMUSCULAR | Status: DC | PRN
  Administered 2023-05-10: 4 mg via INTRAVENOUS

## 2023-05-10 MED ORDER — PHENOL 1.4 % MT LIQD: 1.0000 | OROMUCOSAL | Status: DC | PRN

## 2023-05-10 MED ORDER — ASPIRIN 81 MG PO CHEW
81.0000 mg | CHEWABLE_TABLET | Freq: Two times a day (BID) | ORAL | Status: DC
  Administered 2023-05-10 – 2023-05-11 (×2): 81 mg via ORAL
  Filled 2023-05-10 (×2): qty 1

## 2023-05-10 MED ORDER — VANCOMYCIN HCL 1000 MG IV SOLR
INTRAVENOUS | Status: AC
  Filled 2023-05-10: qty 20

## 2023-05-10 MED ORDER — METHOCARBAMOL 1000 MG/10ML IJ SOLN: 500.0000 mg | Freq: Four times a day (QID) | INTRAMUSCULAR | Status: DC | PRN

## 2023-05-10 MED ORDER — METHOCARBAMOL 500 MG PO TABS
ORAL_TABLET | ORAL | Status: AC
  Administered 2023-05-10: 500 mg via ORAL
  Filled 2023-05-10: qty 1

## 2023-05-10 MED ORDER — ONDANSETRON HCL 4 MG PO TABS: 4.0000 mg | ORAL_TABLET | Freq: Four times a day (QID) | ORAL | Status: DC | PRN

## 2023-05-10 MED ORDER — HYDROCODONE-ACETAMINOPHEN 5-325 MG PO TABS: 1.0000 | ORAL_TABLET | ORAL | Status: DC | PRN

## 2023-05-10 MED ORDER — MIDAZOLAM HCL 2 MG/2ML IJ SOLN
INTRAMUSCULAR | Status: AC
  Filled 2023-05-10: qty 2

## 2023-05-10 MED ORDER — POVIDONE-IODINE 10 % EX SWAB
2.0000 | Freq: Once | CUTANEOUS | Status: AC
  Administered 2023-05-10: 2 via TOPICAL

## 2023-05-10 MED ORDER — METHOCARBAMOL 500 MG PO TABS: 500.0000 mg | ORAL_TABLET | Freq: Four times a day (QID) | ORAL | Status: DC | PRN

## 2023-05-10 MED ORDER — CHLORHEXIDINE GLUCONATE 0.12 % MT SOLN
15.0000 mL | Freq: Once | OROMUCOSAL | Status: AC
  Administered 2023-05-10: 15 mL via OROMUCOSAL
  Filled 2023-05-10: qty 15

## 2023-05-10 MED ORDER — DIPHENHYDRAMINE HCL 12.5 MG/5ML PO ELIX: 25.0000 mg | ORAL_SOLUTION | ORAL | Status: DC | PRN

## 2023-05-10 MED ORDER — SODIUM CHLORIDE 0.9 % IR SOLN
Status: DC | PRN
  Administered 2023-05-10: 1000 mL

## 2023-05-10 MED ORDER — BUPIVACAINE-MELOXICAM ER 400-12 MG/14ML IJ SOLN
INTRAMUSCULAR | Status: DC | PRN
  Administered 2023-05-10: 400 mg

## 2023-05-10 MED ORDER — ACETAMINOPHEN 10 MG/ML IV SOLN: 1000.0000 mg | Freq: Once | INTRAVENOUS | Status: DC | PRN

## 2023-05-10 MED ORDER — MENTHOL 3 MG MT LOZG: 1.0000 | LOZENGE | OROMUCOSAL | Status: DC | PRN

## 2023-05-10 MED ORDER — ALUM & MAG HYDROXIDE-SIMETH 200-200-20 MG/5ML PO SUSP: 30.0000 mL | ORAL | Status: DC | PRN

## 2023-05-10 MED ORDER — TRANEXAMIC ACID-NACL 1000-0.7 MG/100ML-% IV SOLN
1000.0000 mg | Freq: Once | INTRAVENOUS | Status: AC
  Administered 2023-05-10: 1000 mg via INTRAVENOUS
  Filled 2023-05-10: qty 100

## 2023-05-10 MED ORDER — DEXAMETHASONE SODIUM PHOSPHATE 10 MG/ML IJ SOLN
10.0000 mg | Freq: Once | INTRAMUSCULAR | Status: AC
  Administered 2023-05-11: 10 mg via INTRAVENOUS
  Filled 2023-05-10: qty 1

## 2023-05-10 MED ORDER — GABAPENTIN 300 MG PO CAPS
300.0000 mg | ORAL_CAPSULE | Freq: Every day | ORAL | Status: DC
  Administered 2023-05-10: 300 mg via ORAL
  Filled 2023-05-10: qty 1

## 2023-05-10 MED ORDER — LACTATED RINGERS IV SOLN: INTRAVENOUS | Status: DC

## 2023-05-10 MED ORDER — HYDROMORPHONE HCL 1 MG/ML IJ SOLN: 0.2500 mg | INTRAMUSCULAR | Status: DC | PRN

## 2023-05-10 MED ORDER — PROPOFOL 10 MG/ML IV BOLUS
INTRAVENOUS | Status: AC
  Filled 2023-05-10: qty 20

## 2023-05-10 MED ORDER — PRONTOSAN WOUND IRRIGATION OPTIME
TOPICAL | Status: DC | PRN
  Administered 2023-05-10: 1 via TOPICAL

## 2023-05-10 MED ORDER — ORAL CARE MOUTH RINSE: 15.0000 mL | Freq: Once | OROMUCOSAL | Status: AC

## 2023-05-10 MED ORDER — CEFAZOLIN SODIUM-DEXTROSE 2-4 GM/100ML-% IV SOLN
2.0000 g | Freq: Four times a day (QID) | INTRAVENOUS | Status: AC
  Administered 2023-05-10 (×2): 2 g via INTRAVENOUS
  Filled 2023-05-10 (×2): qty 100

## 2023-05-10 MED ORDER — MAGNESIUM CITRATE PO SOLN: 1.0000 | Freq: Once | ORAL | Status: DC | PRN

## 2023-05-10 MED ORDER — 0.9 % SODIUM CHLORIDE (POUR BTL) OPTIME
TOPICAL | Status: DC | PRN
  Administered 2023-05-10: 1000 mL

## 2023-05-10 MED ORDER — DROPERIDOL 2.5 MG/ML IJ SOLN: 0.6250 mg | Freq: Once | INTRAMUSCULAR | Status: DC | PRN

## 2023-05-10 MED ORDER — ONDANSETRON HCL 4 MG/2ML IJ SOLN: 4.0000 mg | Freq: Four times a day (QID) | INTRAMUSCULAR | Status: DC | PRN

## 2023-05-10 MED ORDER — PANTOPRAZOLE SODIUM 40 MG PO TBEC
40.0000 mg | DELAYED_RELEASE_TABLET | Freq: Every day | ORAL | Status: DC
Start: 2023-05-10 — End: 2023-05-11
  Administered 2023-05-10 – 2023-05-11 (×2): 40 mg via ORAL
  Filled 2023-05-10 (×2): qty 1

## 2023-05-10 MED ORDER — ACETAMINOPHEN 325 MG PO TABS: 325.0000 mg | ORAL_TABLET | Freq: Four times a day (QID) | ORAL | Status: DC | PRN

## 2023-05-10 MED ORDER — METOCLOPRAMIDE HCL 5 MG/ML IJ SOLN: 5.0000 mg | Freq: Three times a day (TID) | INTRAMUSCULAR | Status: DC | PRN

## 2023-05-10 MED ORDER — TRANEXAMIC ACID 1000 MG/10ML IV SOLN
INTRAVENOUS | Status: DC | PRN
  Administered 2023-05-10: 2000 mg via TOPICAL

## 2023-05-10 MED ORDER — TRANEXAMIC ACID-NACL 1000-0.7 MG/100ML-% IV SOLN
1000.0000 mg | INTRAVENOUS | Status: AC
  Administered 2023-05-10: 1000 mg via INTRAVENOUS
  Filled 2023-05-10: qty 100

## 2023-05-10 MED ORDER — DOXYCYCLINE HYCLATE 100 MG PO TABS: 100.0000 mg | ORAL_TABLET | Freq: Two times a day (BID) | ORAL | 0 refills | Status: DC

## 2023-05-10 MED ORDER — SORBITOL 70 % SOLN: 30.0000 mL | Freq: Every day | Status: DC | PRN

## 2023-05-10 MED ORDER — PROPOFOL 500 MG/50ML IV EMUL
INTRAVENOUS | Status: DC | PRN
  Administered 2023-05-10: 50 mg via INTRAVENOUS
  Administered 2023-05-10: 100 ug/kg/min via INTRAVENOUS
  Administered 2023-05-10 (×3): 50 mg via INTRAVENOUS
  Administered 2023-05-10: 8 ug/kg/min via INTRAVENOUS

## 2023-05-10 MED ORDER — DEXAMETHASONE SODIUM PHOSPHATE 10 MG/ML IJ SOLN
INTRAMUSCULAR | Status: AC
  Filled 2023-05-10: qty 1

## 2023-05-10 MED ORDER — BUPIVACAINE-MELOXICAM ER 400-12 MG/14ML IJ SOLN
INTRAMUSCULAR | Status: AC
  Filled 2023-05-10: qty 1

## 2023-05-10 MED ORDER — HYDROCODONE-ACETAMINOPHEN 5-325 MG PO TABS
ORAL_TABLET | ORAL | Status: AC
  Administered 2023-05-10: 2 via ORAL
  Filled 2023-05-10: qty 2

## 2023-05-10 MED ORDER — ACETAMINOPHEN 160 MG/5ML PO SOLN: 325.0000 mg | Freq: Once | ORAL | Status: DC | PRN

## 2023-05-10 MED ORDER — MUPIROCIN 2 % EX OINT: 1.0000 | TOPICAL_OINTMENT | Freq: Two times a day (BID) | CUTANEOUS | 0 refills | Status: AC

## 2023-05-10 MED ORDER — TRANEXAMIC ACID 1000 MG/10ML IV SOLN
2000.0000 mg | Freq: Once | INTRAVENOUS | Status: DC
  Filled 2023-05-10: qty 20

## 2023-05-10 MED ORDER — METOCLOPRAMIDE HCL 5 MG PO TABS: 5.0000 mg | ORAL_TABLET | Freq: Three times a day (TID) | ORAL | Status: DC | PRN

## 2023-05-10 SURGICAL SUPPLY — 68 items
BAG COUNTER SPONGE SURGICOUNT (BAG) ×1 IMPLANT
BLADE CLIPPER SURG (BLADE) ×1 IMPLANT
BONE FIBERS PLIAFX 5ML (Bone Implant) ×1 IMPLANT
BRUSH FEMORAL CANAL (MISCELLANEOUS) IMPLANT
COVER SURGICAL LIGHT HANDLE (MISCELLANEOUS) ×1 IMPLANT
DRAPE C-ARM 42X72 X-RAY (DRAPES) IMPLANT
DRAPE HALF SHEET 40X57 (DRAPES) ×2 IMPLANT
DRAPE HIP W/POCKET STRL (MISCELLANEOUS) ×1 IMPLANT
DRAPE INCISE IOBAN 66X45 STRL (DRAPES) ×1 IMPLANT
DRAPE SURG ORHT 6 SPLT 77X108 (DRAPES) ×2 IMPLANT
DRAPE U-SHAPE 47X51 STRL (DRAPES) ×1 IMPLANT
DRESSING PEEL AND PLAC PRVNA20 (GAUZE/BANDAGES/DRESSINGS) IMPLANT
DRSG AQUACEL AG ADV 3.5X10 (GAUZE/BANDAGES/DRESSINGS) IMPLANT
DRSG AQUACEL AG ADV 3.5X14 (GAUZE/BANDAGES/DRESSINGS) IMPLANT
DRSG PEEL AND PLACE PREVENA 20 (GAUZE/BANDAGES/DRESSINGS) ×1 IMPLANT
DURAPREP 26ML APPLICATOR (WOUND CARE) ×3 IMPLANT
ELECT BLADE 6.5 EXT (BLADE) IMPLANT
ELECT CAUTERY BLADE 6.4 (BLADE) IMPLANT
ELECT REM PT RETURN 9FT ADLT (ELECTROSURGICAL) ×1 IMPLANT
ELECTRODE REM PT RTRN 9FT ADLT (ELECTROSURGICAL) ×1 IMPLANT
EVACUATOR 1/8 PVC DRAIN (DRAIN) IMPLANT
FILTER STRAW FLUID ASPIR (MISCELLANEOUS) ×1 IMPLANT
GLOVE BIOGEL PI IND STRL 7.0 (GLOVE) ×2 IMPLANT
GLOVE BIOGEL PI IND STRL 7.5 (GLOVE) ×3 IMPLANT
GLOVE ECLIPSE 6.5 STRL STRAW (GLOVE) ×2 IMPLANT
GLOVE SKINSENSE STRL SZ7.5 (GLOVE) ×1 IMPLANT
GLOVE SURG SYN 7.5 E (GLOVE) ×2 IMPLANT
GLOVE SURG SYN 7.5 PF PI (GLOVE) ×2 IMPLANT
GLOVE SURG UNDER POLY LF SZ7 (GLOVE) ×19 IMPLANT
GLOVE SURG UNDER POLY LF SZ7.5 (GLOVE) ×2 IMPLANT
GOWN TOGA ZIPPER T7+ PEEL AWAY (MISCELLANEOUS) ×2 IMPLANT
GRAFT BNE FBR PLIAFX PRIME 5 (Bone Implant) IMPLANT
HEAD FEM LRG 32X+11 (Hips) IMPLANT
HEAD FEM STD 32X+9 STRL (Hips) IMPLANT
HOOD PEEL AWAY T7 (MISCELLANEOUS) ×1 IMPLANT
KIT BASIN OR (CUSTOM PROCEDURE TRAY) ×1 IMPLANT
KIT DRSG PREVENA PLUS 7DAY 125 (MISCELLANEOUS) IMPLANT
KIT TURNOVER KIT B (KITS) ×1 IMPLANT
LINER MARATHON 10D 1DX52 OD (Liner) IMPLANT
MANIFOLD NEPTUNE II (INSTRUMENTS) ×1 IMPLANT
NDL SPNL 18GX3.5 QUINCKE PK (NEEDLE) ×1 IMPLANT
NEEDLE SPNL 18GX3.5 QUINCKE PK (NEEDLE) ×1 IMPLANT
NS IRRIG 1000ML POUR BTL (IV SOLUTION) ×1 IMPLANT
PACK TOTAL JOINT (CUSTOM PROCEDURE TRAY) ×1 IMPLANT
PAD ARMBOARD POSITIONER FOAM (MISCELLANEOUS) ×2 IMPLANT
PASSER SUT SWANSON 36MM LOOP (INSTRUMENTS) ×1 IMPLANT
PIN DRILL HDLS TROCAR 75 4PK (PIN) IMPLANT
RING LOCK ACET OD 52 (Hips) IMPLANT
SET HNDPC FAN SPRY TIP SCT (DISPOSABLE) IMPLANT
SPONGE T-LAP 18X18 ~~LOC~~+RFID (SPONGE) IMPLANT
STAPLER VISISTAT 35W (STAPLE) IMPLANT
SUT ETHIBOND 2 V 37 (SUTURE) IMPLANT
SUT ETHIBOND NAB CT1 #1 30IN (SUTURE) ×1 IMPLANT
SUT ETHILON 2 0 PSLX (SUTURE) IMPLANT
SUT PDS AB 0 CT 36 (SUTURE) ×1 IMPLANT
SUT PDS AB 1 CT 36 (SUTURE) ×1 IMPLANT
SUT STRATAFIX 1PDS 45CM VIOLET (SUTURE) IMPLANT
SUT VIC AB 0 CT1 27XBRD ANBCTR (SUTURE) IMPLANT
SUT VIC AB 1 CT1 27XBRD ANTBC (SUTURE) ×2 IMPLANT
SUT VIC AB 2-0 CT1 TAPERPNT 27 (SUTURE) ×2 IMPLANT
SYR 50ML LL SCALE MARK (SYRINGE) ×1 IMPLANT
SYR TB 1ML LUER SLIP (SYRINGE) ×1 IMPLANT
TOWEL GREEN STERILE (TOWEL DISPOSABLE) ×1 IMPLANT
TOWEL GREEN STERILE FF (TOWEL DISPOSABLE) ×1 IMPLANT
TOWER CARTRIDGE SMART MIX (DISPOSABLE) IMPLANT
TRAY FOL W/BAG SLVR 16FR STRL (SET/KITS/TRAYS/PACK) IMPLANT
TUBE SUCT ARGYLE STRL (TUBING) ×1 IMPLANT
WATER STERILE IRR 1000ML POUR (IV SOLUTION) ×3 IMPLANT

## 2023-05-10 NOTE — Op Note (Addendum)
 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  Assist: Oneal Grout, PA-C   Anesthesia: spinal  Prosthesis: Depuy Head: 32 mm size: +9 Liner: +4, 10 degree lip Bearing Type: metal/poly  Total Hip Arthroplasty (Anterior Approach) Op Note:  After informed consent was obtained and the operative extremity marked in the holding area, the patient was brought back to the operating room and placed supine on the HANA table. Next, the operative extremity was prepped and draped in normal sterile fashion. Surgical timeout occurred verifying patient identification, surgical site, surgical procedure and administration of antibiotics.  A 10 cm longitudinal incision was made starting from 2 fingerbreadths lateral and inferior to the ASIS towards the lateral aspect of the patella.  A Hueter approach to the hip was performed, using the interval between tensor fascia lata and sartorius.  Dissection was carried bluntly down onto the anterior hip capsule.  The lateral femoral circumflex vessels were identified and coagulated.  The anterior capsule was excised.  Limited portions of the posterior capsule were excised in order to improve exposure and mobilization of the proximal femur.  Osteolysis was encountered around the calcar of the femur as well as zone one of the acetabulum.  The reactive tissue was debrided  away with a rondure.  The stem was well-fixed.  The cup was also well-fixed.  The leg was then placed into traction.  The head ball was gently tamped off of the trunnion.  The leg was externally rotated to about 90 degrees which allowed the trunnion to dislocate out of the head ball which remained in the socket.  The head ball was then removed without any difficulty.  Traction was then released with the leg at 90 degrees.  The leg was flexed up towards the ceiling in order to move the femoral component posteriorly to improve exposure of the acetabulum.  The soft tissues were carefully cleared away from the liner and the cup.  Retractors were placed to improve exposure.  The liner was then extracted with the device without any damage to the cup.  The locking ring was then removed easily with a 1/4 inch osteotome.  The inside of the cup was then thoroughly irrigated with pulsatile lavage.  A new locking ring was inserted followed by a new +4, 10 degree liner was inserted with the 10 degree lip placed posteriorly.  We then turned our attention to trialing femoral heads. After placing the femoral hook, the leg was taken to externally rotated, extended and adducted position taking care to perform soft tissue releases to allow for adequate mobilization of the femur. Soft tissue was cleared from the shoulder of the greater trochanter and the hook elevator used to improve exposure of the proximal femur.  A trial 32 +5 mm head ball was placed and the leg was brought back up to neutral and the construct reduced.  The position and sizing of components, offset and leg lengths were checked using fluoroscopy.  The +5 head ball did not entirely restore the leg length discrepancy.  Stability of the construct was checked in 45 degrees of hip extension and 90 degrees of external rotation without any subluxation, shuck or impingement of prosthesis. We dislocated the prosthesis, dropped the leg back into position, removed trial  components, and irrigated copiously.  I went with a 32+9 mm head ball.  I then placed, the leg brought back up, the system reduced and fluoroscopy used to verify positioning.  Antibiotic irrigation was placed in the surgical wound.  10 cc of bone graft was used to fill the zone 1 acetabular osteolytic defect. We irrigated, obtained hemostasis and closed the capsule using #2 ethibond suture.  A topical mixture of 0.25% bupivacaine and meloxicam was placed deep to the fascia.  One gram of vancomycin powder was placed in the surgical bed.   One gram of topical tranexamic acid was injected into the joint.  The fascia was closed with #1 stratafix, the deep fat layer was closed with 0 vicryl, the subcutaneous layers closed with 2.0 Vicryl Plus and the skin closed with 2-0 nylon and incisional VAC. A sterile dressing was applied. The patient was awakened in the operating room and taken to recovery in stable condition.  All sponge, needle, and instrument counts were correct at the end of the case.   Tessa Lerner, my PA, was a medical necessity for opening, closing, limb positioning, retracting, exposing, and overall facilitation and timely completion of the surgery.  Position: supine  Complications: see description of procedure.  Time Out: performed   Drains/Packing: none  Estimated blood loss: see anesthesia record  Returned to Recovery Room: in good condition.   Antibiotics: yes   Mechanical VTE (DVT) Prophylaxis: sequential compression devices, TED thigh-high  Chemical VTE (DVT) Prophylaxis: Aspirin  Fluid Replacement: see anesthesia record  Specimens Removed: 1 to pathology   Sponge and Instrument Count Correct? yes   PACU: portable radiograph - low AP   Plan/RTC: Return in 2 weeks for staple removal. Weight Bearing/Load Lower Extremity: full  Hip precautions: none Suture Removal: 2 weeks   N. Glee Arvin, MD [facility] 12:54 PM   Implant Name Type Inv. Item Serial No.  Manufacturer Lot No. LRB No. Used Action  BONE FIBERS PLIAFX - [id number] Bone Implant BONE FIBERS PLIAFX [id number] [facility]  Right 1 Implanted  LINER MARATHON 10D 1DX52 OD - [id number] Liner LINER MARATHON 10D 1DX52 OD  [employer] [id number] Right 1 Implanted  RING LOCK ACET OD 52 - [id number] Hips RING LOCK ACET OD 52  [employer] [id number] Right 1 Implanted  HEAD FEM LRG 32X+11 - [id number] Hips HEAD FEM LRG 32X+11  [employer] [id number] Right 1 Implanted and Explanted  HEAD FEM STD 32X+9 STRL - [id number] Hips HEAD FEM STD 32X+9 STRL  [employer] [id number] Right 1 Implanted

## 2023-05-10 NOTE — Plan of Care (Signed)
  Problem: Acute Rehab PT Goals(only PT should resolve) Goal: Pt/caregiver will Perform Home Exercise Program Flowsheets (Taken 05/10/2023 1650) Pt/caregiver will Perform Home Exercise Program:  For increased ROM  For increased strengthening  Independently   Problem: Acute Rehab PT Goals(only PT should resolve) Goal: Pt Will Go Up/Down Stairs Flowsheets (Taken 05/10/2023 1649) Pt will Go Up / Down Stairs:  1-2 stairs  with minimal assist  with least restrictive assistive device   Problem: Acute Rehab PT Goals(only PT should resolve) Goal: Pt Will Ambulate Flowsheets (Taken 05/10/2023 1649) Pt will Ambulate:  25 feet  with contact guard assist  with rolling walker   Problem: Acute Rehab PT Goals(only PT should resolve) Goal: Patient Will Transfer Sit To/From Stand Flowsheets (Taken 05/10/2023 1648) Patient will transfer sit to/from stand: with minimal assist

## 2023-05-10 NOTE — Transfer of Care (Signed)
 Immediate Anesthesia Transfer of Care Note  Patient: Roy [name]  Procedure(s) Performed: RIGHT TOTAL HIP REVISION, ANTERIOR APPROACH (Right: Hip)  Patient Location: PACU  Anesthesia Type:Spinal  Level of Consciousness: awake  Airway & Oxygen Therapy: Patient Spontanous Breathing  Post-op Assessment: Report given to RN  Post vital signs: stable  Last Vitals:  Vitals Value Taken Time  BP 99/57 05/10/23 1330  Temp    Pulse 66 05/10/23 1333  Resp 0 05/10/23 1333  SpO2 98 % 05/10/23 1333  Vitals shown include unfiled device data.  Last Pain:  Vitals:   05/10/23 0810  TempSrc:   PainSc: 0-No pain      Patients Stated Pain Goal: 0 (05/10/23 0810)  Complications: No notable events documented.

## 2023-05-10 NOTE — Discharge Instructions (Signed)
 INSTRUCTIONS AFTER JOINT REPLACEMENT   Remove items at home which could result in a fall. This includes throw rugs or furniture in walking pathways ICE to the affected joint every three hours while awake for 30 minutes at a time, for at least the first 3-5 days, and then as needed for pain and swelling.  Continue to use ice for pain and swelling. You may notice swelling that will progress down to the foot and ankle.  This is normal after surgery.  Elevate your leg when you are not up walking on it.   Continue to use the breathing machine you got in the hospital (incentive spirometer) which will help keep your temperature down.  It is common for your temperature to cycle up and down following surgery, especially at night when you are not up moving around and exerting yourself.  The breathing machine keeps your lungs expanded and your temperature down.   DIET:  As you were doing prior to hospitalization, we recommend a well-balanced diet.  DRESSING / WOUND CARE / SHOWERING  Do not remove wound vac.  Do not get this wet.    ACTIVITY  Increase activity slowly as tolerated, but follow the weight bearing instructions below.   No driving for 6 weeks or until further direction given by your physician.  You cannot drive while taking narcotics.  No lifting or carrying greater than 10 lbs. until further directed by your surgeon. Avoid periods of inactivity such as sitting longer than an hour when not asleep. This helps prevent blood clots.  You may return to work once you are authorized by your doctor.     WEIGHT BEARING   Weight bearing as tolerated with assist device (walker, cane, etc) as directed, use it as long as suggested by your surgeon or therapist, typically at least 4-6 weeks.   EXERCISES  Results after joint replacement surgery are often greatly improved when you follow the exercise, range of motion and muscle strengthening exercises prescribed by your doctor. Safety measures are also  important to protect the joint from further injury. Any time any of these exercises cause you to have increased pain or swelling, decrease what you are doing until you are comfortable again and then slowly increase them. If you have problems or questions, call your caregiver or physical therapist for advice.   Rehabilitation is important following a joint replacement. After just a few days of immobilization, the muscles of the leg can become weakened and shrink (atrophy).  These exercises are designed to build up the tone and strength of the thigh and leg muscles and to improve motion. Often times heat used for twenty to thirty minutes before working out will loosen up your tissues and help with improving the range of motion but do not use heat for the first two weeks following surgery (sometimes heat can increase post-operative swelling).   These exercises can be done on a training (exercise) mat, on the floor, on a table or on a bed. Use whatever works the best and is most comfortable for you.    Use music or television while you are exercising so that the exercises are a pleasant break in your day. This will make your life better with the exercises acting as a break in your routine that you can look forward to.   Perform all exercises about fifteen times, three times per day or as directed.  You should exercise both the operative leg and the other leg as well.  Exercises include:  Quad Sets - Tighten up the muscle on the front of the thigh (Quad) and hold for 5-10 seconds.   Straight Leg Raises - With your knee straight (if you were given a brace, keep it on), lift the leg to 60 degrees, hold for 3 seconds, and slowly lower the leg.  Perform this exercise against resistance later as your leg gets stronger.  Leg Slides: Lying on your back, slowly slide your foot toward your buttocks, bending your knee up off the floor (only go as far as is comfortable). Then slowly slide your foot back down until your  leg is flat on the floor again.  Angel Wings: Lying on your back spread your legs to the side as far apart as you can without causing discomfort.  Hamstring Strength:  Lying on your back, push your heel against the floor with your leg straight by tightening up the muscles of your buttocks.  Repeat, but this time bend your knee to a comfortable angle, and push your heel against the floor.  You may put a pillow under the heel to make it more comfortable if necessary.   A rehabilitation program following joint replacement surgery can speed recovery and prevent re-injury in the future due to weakened muscles. Contact your doctor or a physical therapist for more information on knee rehabilitation.    CONSTIPATION  Constipation is defined medically as fewer than three stools per week and severe constipation as less than one stool per week.  Even if you have a regular bowel pattern at home, your normal regimen is likely to be disrupted due to multiple reasons following surgery.  Combination of anesthesia, postoperative narcotics, change in appetite and fluid intake all can affect your bowels.   YOU MUST use at least one of the following options; they are listed in order of increasing strength to get the job done.  They are all available over the counter, and you may need to use some, POSSIBLY even all of these options:    Drink plenty of fluids (prune juice may be helpful) and high fiber foods Colace 100 mg by mouth twice a day  Senokot for constipation as directed and as needed Dulcolax (bisacodyl), take with full glass of water  Miralax (polyethylene glycol) once or twice a day as needed.  If you have tried all these things and are unable to have a bowel movement in the first 3-4 days after surgery call either your surgeon or your primary doctor.    If you experience loose stools or diarrhea, hold the medications until you stool forms back up.  If your symptoms do not get better within 1 week or if  they get worse, check with your doctor.  If you experience "the worst abdominal pain ever" or develop nausea or vomiting, please contact the office immediately for further recommendations for treatment.   ITCHING:  If you experience itching with your medications, try taking only a single pain pill, or even half a pain pill at a time.  You can also use Benadryl over the counter for itching or also to help with sleep.   TED HOSE STOCKINGS:  Use stockings on both legs until for at least 2 weeks or as directed by physician office. They may be removed at night for sleeping.  MEDICATIONS:  See your medication summary on the "After Visit Summary" that nursing will review with you.  You may have some home medications which will be placed on hold until you complete the course  of blood thinner medication.  It is important for you to complete the blood thinner medication as prescribed.  PRECAUTIONS:  If you experience chest pain or shortness of breath - call 911 immediately for transfer to the hospital emergency department.   If you develop a fever greater that 101 F, purulent drainage from wound, increased redness or drainage from wound, foul odor from the wound/dressing, or calf pain - CONTACT YOUR SURGEON.                                                   FOLLOW-UP APPOINTMENTS:  If you do not already have a post-op appointment, please call the office for an appointment to be seen by your surgeon.  Guidelines for how soon to be seen are listed in your "After Visit Summary", but are typically between 1-4 weeks after surgery.  OTHER INSTRUCTIONS:   Knee Replacement:  Do not place pillow under knee, focus on keeping the knee straight while resting. CPM instructions: 0-90 degrees, 2 hours in the morning, 2 hours in the afternoon, and 2 hours in the evening. Place foam block, curve side up under heel at all times except when in CPM or when walking.  DO NOT modify, tear, cut, or change the foam block in any  way.  POST-OPERATIVE OPIOID TAPER INSTRUCTIONS: It is important to wean off of your opioid medication as soon as possible. If you do not need pain medication after your surgery it is ok to stop day one. Opioids include: Codeine, Hydrocodone(Norco, Vicodin), Oxycodone(Percocet, oxycontin) and hydromorphone amongst others.  Long term and even short term use of opiods can cause: Increased pain response Dependence Constipation Depression Respiratory depression And more.  Withdrawal symptoms can include Flu like symptoms Nausea, vomiting And more Techniques to manage these symptoms Hydrate well Eat regular healthy meals Stay active Use relaxation techniques(deep breathing, meditating, yoga) Do Not substitute Alcohol to help with tapering If you have been on opioids for less than two weeks and do not have pain than it is ok to stop all together.  Plan to wean off of opioids This plan should start within one week post op of your joint replacement. Maintain the same interval or time between taking each dose and first decrease the dose.  Cut the total daily intake of opioids by one tablet each day Next start to increase the time between doses. The last dose that should be eliminated is the evening dose.   Per [facility] clinic policy, our goal is ensure optimal postoperative pain control with a multimodal pain management strategy. For all [facility] patients, our goal is to wean post-operative narcotic medications by 6 weeks post-operatively. If this is not possible due to utilization of pain medication prior to surgery, your [facility] doctor will support your acute post-operative pain control for the first 6 weeks postoperatively, with a plan to transition you back to your primary pain team following that. Cyndia Skeeters will work to ensure a Therapist, occupational.   MAKE SURE YOU:  Understand these instructions.  Get help right away if you are not doing well or get worse.    Thank you for letting  us be a part of your medical care team.  It is a privilege we respect greatly.  We hope these instructions will help you stay on track for a fast and full recovery!

## 2023-05-10 NOTE — Anesthesia Preprocedure Evaluation (Addendum)
 Anesthesia Evaluation  Patient identified by MRN, date of birth, ID band Patient awake    Reviewed: Allergy & Precautions, NPO status , Patient's Chart, lab work & pertinent test results  Airway Mallampati: IV  TM Distance: >3 FB Neck ROM: Full    Dental  (+) Teeth Intact, Dental Advisory Given   Pulmonary former smoker   breath sounds clear to auscultation       Cardiovascular negative cardio ROS  Rhythm:Regular Rate:Normal     Neuro/Psych negative neurological ROS  negative psych ROS   GI/Hepatic Neg liver ROS,GERD  ,,  Endo/Other  negative endocrine ROS    Renal/GU negative Renal ROS     Musculoskeletal  (+) Arthritis ,    Abdominal   Peds  Hematology negative hematology ROS (+)   Anesthesia Other Findings   Reproductive/Obstetrics                             Anesthesia Physical Anesthesia Plan  ASA: 2  Anesthesia Plan: Spinal   Post-op Pain Management: Ofirmev IV (intra-op)*   Induction: Intravenous  PONV Risk Score and Plan: 2 and Ondansetron, Dexamethasone, Propofol infusion and Midazolam  Airway Management Planned: Natural Airway and Nasal Cannula  Additional Equipment: None  Intra-op Plan:   Post-operative Plan:   Informed Consent: I have reviewed the patients History and Physical, chart, labs and discussed the procedure including the risks, benefits and alternatives for the proposed anesthesia with the patient or authorized representative who has indicated his/her understanding and acceptance.       Plan Discussed with: CRNA  Anesthesia Plan Comments: (Lab Results      Component                Value               Date                      WBC                      9.5                 04/30/2023                HGB                      16.4                04/30/2023                HCT                      48.8                04/30/2023                MCV                       80.9                04/30/2023                PLT                      287  04/30/2023           )       Anesthesia Quick Evaluation

## 2023-05-10 NOTE — Evaluation (Signed)
 Physical Therapy Evaluation Patient Details Name: Roy [name] MRN: [id number] DOB: September 03, 1955 Today's Date: 05/10/2023  History of Present Illness  68 y/o male presents to [facility] on 3/17 s/p R THA revision (anterior approach). PMH includes: OA and psoriatic arthritis, L THA.  Clinical Impression  Patient lives at home with his wife and is independent with ADLs/IADLs. Patient presents with R hip pain, decreased strength, decreased ROM, and functional independence. Additionally, the patient presents with limited L knee ROM at baseline. Today's session was limited due to increased amount of pain in the R hip, deferred any transfers or ambulation assessment. Reviewed HEP at bedside. Due to lack of caregiver support, the patient will benefit from continued inpatient follow up therapy, <3 hours/day to maximize functional level of independence before being d/c home. Acute PT will continue to follow up to address his impairments. Thank you for this consult.      If plan is discharge home, recommend the following: A lot of help with walking and/or transfers;A little help with bathing/dressing/bathroom;Assistance with cooking/housework;Assist for transportation;Help with stairs or ramp for entrance   Can travel by private vehicle   No    Equipment Recommendations Other (comment) (TBD)  Recommendations for Other Services       Functional Status Assessment Patient has had a recent decline in their functional status and demonstrates the ability to make significant improvements in function in a reasonable and predictable amount of time.     Precautions / Restrictions Precautions Precautions: Other (comment);Anterior Hip (Limited bilateral knee ROM, wound vac) Precaution Booklet Issued: Yes (comment) Recall of Precautions/Restrictions: Intact Restrictions Weight Bearing Restrictions Per Provider Order: Yes RLE Weight Bearing Per Provider Order: Weight bearing as tolerated      Mobility  Bed  Mobility Overal bed mobility: Needs Assistance Bed Mobility: Supine to Sit           General bed mobility comments: Deferred due to increased amount of pain in R hip    Transfers                        Ambulation/Gait                  Stairs            Wheelchair Mobility     Tilt Bed    Modified Rankin (Stroke Patients Only)       Balance                                             Pertinent Vitals/Pain Pain Assessment Pain Assessment: Faces Faces Pain Scale: Hurts even more Pain Location: R hip Pain Descriptors / Indicators: Aching, Discomfort, Grimacing, Tender Pain Intervention(s): Limited activity within patient's tolerance, Monitored during session, Repositioned, Ice applied    Home Living Family/patient expects to be discharged to:: Private residence Living Arrangements: Spouse/significant other Available Help at Discharge: Other (Comment) Type of Home: House Home Access: Ramped entrance;Stairs to enter Entrance Stairs-Rails: None Entrance Stairs-Number of Steps: 1     Home Equipment: Rolling Walker (2 wheels);Grab bars - tub/shower;Grab bars - toilet;Lift chair;Standard Walker;Cane - single point      Prior Function Prior Level of Function : Independent/Modified Independent;Driving                     Extremity/Trunk Assessment   Upper Extremity  Assessment Upper Extremity Assessment: Overall WFL for tasks assessed    Lower Extremity Assessment Lower Extremity Assessment: RLE deficits/detail;LLE deficits/detail RLE Deficits / Details: Limited Knee and Hip ROM LLE Deficits / Details: Limited Knee ROM       Communication   Communication Communication: No apparent difficulties    Cognition Arousal: Alert Behavior During Therapy: WFL for tasks assessed/performed   PT - Cognitive impairments: No apparent impairments                         Following commands: Intact        Cueing Cueing Techniques: Verbal cues, Tactile cues     General Comments General comments (skin integrity, edema, etc.): Patient requests SNF due to lack of support from wife at home. Patient reports that he spoke to the surgeon about d/c recommendation and has done this in the past.  Wants to regain strength and a certain level of independence before beign d/c back home    Exercises Total Joint Exercises Ankle Circles/Pumps: AROM, Both, 10 reps, Supine Quad Sets: AROM, Both, 10 reps, Supine Hip ABduction/ADduction: AAROM, Right, 5 reps, Supine   Assessment/Plan    PT Assessment Patient needs continued PT services  PT Problem List Decreased strength;Decreased range of motion;Decreased activity tolerance;Decreased balance;Decreased mobility;Decreased coordination;Pain       PT Treatment Interventions DME instruction;Gait training;Stair training;Functional mobility training;Therapeutic activities;Therapeutic exercise;Balance training;Neuromuscular re-education    PT Goals (Current goals can be found in the Care Plan section)  Acute Rehab PT Goals Patient Stated Goal: Attend cocnert on May 9 PT Goal Formulation: With patient Time For Goal Achievement: 05/24/23 Potential to Achieve Goals: Good    Frequency Min 1X/week     Co-evaluation               AM-PAC PT "6 Clicks" Mobility  Outcome Measure Help needed turning from your back to your side while in a flat bed without using bedrails?: A Lot Help needed moving from lying on your back to sitting on the side of a flat bed without using bedrails?: A Lot Help needed moving to and from a bed to a chair (including a wheelchair)?: A Lot Help needed standing up from a chair using your arms (e.g., wheelchair or bedside chair)?: A Lot Help needed to walk in hospital room?: A Lot Help needed climbing 3-5 steps with a railing? : A Lot 6 Click Score: 12    End of Session Equipment Utilized During Treatment: Gait belt Activity  Tolerance: Patient limited by pain Patient left: in bed;with call bell/phone within reach;with family/visitor present;with nursing/sitter in room Nurse Communication: Mobility status PT Visit Diagnosis: Muscle weakness (generalized) (M62.81);Pain;Other abnormalities of gait and mobility (R26.89) Pain - Right/Left: Right Pain - part of body: Hip    Time: 4696-2952 PT Time Calculation (min) (ACUTE ONLY): 26 min   Charges:   PT Evaluation $PT Eval Low Complexity: 1 Low PT Treatments $Therapeutic Exercise: 23-37 mins PT General Charges $$ ACUTE PT VISIT: 1 Visit         Doreen Beam, SPT   Atha Mcbain 05/10/2023, 4:50 PM

## 2023-05-10 NOTE — Anesthesia Postprocedure Evaluation (Signed)
 Anesthesia Post Note  Patient: Roy [name]  Procedure(s) Performed: RIGHT TOTAL HIP REVISION, ANTERIOR APPROACH (Right: Hip)     Patient location during evaluation: PACU Anesthesia Type: Spinal Level of consciousness: oriented and awake and alert Pain management: pain level controlled Vital Signs Assessment: post-procedure vital signs reviewed and stable Respiratory status: spontaneous breathing, respiratory function stable and patient connected to nasal cannula oxygen Cardiovascular status: blood pressure returned to baseline and stable Postop Assessment: no headache, no backache and no apparent nausea or vomiting Anesthetic complications: no  No notable events documented.  Last Vitals:  Vitals:   05/10/23 1425 05/10/23 1500  BP:  132/63  Pulse: 63 64  Resp: 15 16  Temp:  36.5 C  SpO2: 98%     Last Pain:  Vitals:   05/10/23 1500  TempSrc: Oral  PainSc:                  Shelton Silvas

## 2023-05-10 NOTE — Plan of Care (Signed)

## 2023-05-10 NOTE — Anesthesia Procedure Notes (Signed)
 Spinal  Start time: 05/10/2023 11:00 AM End time: 05/10/2023 11:06 AM Reason for block: surgical anesthesia Staffing Performed: anesthesiologist  Anesthesiologist: Shelton Silvas, MD Performed by: Shelton Silvas, MD Authorized by: Shelton Silvas, MD   Preanesthetic Checklist Completed: patient identified, IV checked, site marked, risks and benefits discussed, surgical consent, monitors and equipment checked, pre-op evaluation and timeout performed Spinal Block Patient position: right lateral decubitus Prep: DuraPrep and site prepped and draped Location: L3-4 Injection technique: single-shot Needle Needle type: Pencan  Needle gauge: 24 G Needle length: 10 cm Needle insertion depth: 10 cm Additional Notes Patient tolerated well. No immediate complications. Pt unable to maintain the sitting position.   Functioning IV was confirmed and monitors were applied. Sterile prep and drape, including hand hygiene and sterile gloves were used. The patient was positioned and the back was prepped. The skin was anesthetized with lidocaine. Free flow of clear CSF was obtained prior to injecting local anesthetic into the CSF. The spinal needle aspirated freely following injection. The needle was carefully withdrawn. The patient tolerated the procedure well.

## 2023-05-10 NOTE — H&P (Signed)
 PREOPERATIVE H&P  Chief Complaint: right total hip arthroplasty polywear  HPI: Roy [name] is a 68 y.o. male who presents for surgical treatment of right total hip arthroplasty polywear.  He denies any changes in medical history.  Past Surgical History:  Procedure Laterality Date   EXCISIONAL TOTAL KNEE ARTHROPLASTY WITH ANTIBIOTIC SPACERS Right 07/07/2012   Procedure: EXCISIONAL TOTAL KNEE ARTHROPLASTY WITH ANTIBIOTIC SPACERS;  Surgeon: Valeria Batman, MD;  Location: [facility] OR;  Service: Orthopedics;  Laterality: Right;  Irrigation and Debridement Right Total Knee Replacement, Insertion of ABX Spacer   KNEE ARTHROSCOPY Right 07/04/2012   Procedure: ARTHROSCOPY I&D KNEE;  Surgeon: Nadara Mustard, MD;  Location: [facility] OR;  Service: Orthopedics;  Laterality: Right;   ORIF ELBOW FRACTURE Left 11/24/2019   Procedure: BIOPSY, OPEN REDUCTION INTERNAL FIXATION (ORIF) LEFT ELBOW/OLECRANON FRACTURE;  Surgeon: Eldred Manges, MD;  Location: [facility] OR;  Service: Orthopedics;  Laterality: Left;   ORIF TIBIA FRACTURE  2008   TOTAL HIP ARTHROPLASTY Right 2001   TOTAL HIP ARTHROPLASTY Left 03/11/2022   Procedure: LEFT TOTAL HIP ARTHROPLASTY;  Surgeon: Eldred Manges, MD;  Location: [facility] OR;  Service: Orthopedics;  Laterality: Left;  Needs RNFA   TOTAL KNEE ARTHROPLASTY Right 09/12/2012   redo from 2002 sugery   TOTAL KNEE REVISION Right 09/13/2012   Procedure: TOTAL KNEE REVISION;  Surgeon: Valeria Batman, MD;  Location: [facility] OR;  Service: Orthopedics;  Laterality: Right;  Revision Arthroplasty Right Knee   Social History   Socioeconomic History   Marital status: Married    Spouse name: Not on file   Number of children: 0   Years of education: Not on file   Highest education level: Not on file  Occupational History   Not on file  Tobacco Use   Smoking status: Former    Current packs/day: 0.00    Types: Cigarettes    Quit date: 07/05/1992    Years since quitting: 30.8   Smokeless tobacco: Never   Vaping Use   Vaping status: Never Used  Substance and Sexual Activity   Alcohol use: No    Comment: recovering addict   Drug use: No    Comment: clean since 03/13/1984   Sexual activity: Not on file  Other Topics Concern   Not on file  Social History Narrative   2 stepchildren   Social Drivers of Health   Financial Resource Strain: Not on file  Food Insecurity: Low Risk  (03/23/2023)   Received from [facility]   Hunger Vital Sign    Worried About Running Out of Food in the Last Year: Never true    Ran Out of Food in the Last Year: Never true  Transportation Needs: No Transportation Needs (03/23/2023)   Received from [facility]    In the past 12 months, has lack of reliable transportation kept you from medical appointments, meetings, work or from getting things needed for daily living? : No  Physical Activity: Not on file  Stress: Not on file  Social Connections: Not on file   History reviewed. No pertinent family history. Allergies  Allergen Reactions   Demerol [Meperidine] Nausea And Vomiting   Morphine And Codeine Nausea And Vomiting   Oxycodone Nausea And Vomiting   Prior to Admission medications   Medication Sig Start Date End Date Taking? Authorizing Provider  aspirin (ASPIRIN 81) 81 MG chewable tablet Chew 1 tablet (81 mg total) by mouth 2 (two) times daily. To be  taken after surgery to prevent blood clots 04/28/23   Cristie Hem, PA-C  aspirin (BAYER ASPIRIN) 325 MG tablet Take 1 tablet (325 mg total) by mouth daily. Take one daily for 4 wks for DVT prophylaxis Patient taking differently: Take 650-975 mg by mouth See admin instructions. Take 975 mg in the morning and 650 mg in the evening, may take a third 650 mg dose in the middle of the day as needed for pain 03/13/22  Yes Eldred Manges, MD  cetirizine (ZYRTEC) 10 MG tablet Take 10 mg by mouth at bedtime.   Yes [provider]  docusate sodium (COLACE) 100 MG capsule Take 100 mg by  mouth at bedtime.   Yes [provider]  docusate sodium (COLACE) 100 MG capsule Take 1 capsule (100 mg total) by mouth daily as needed. 04/28/23 04/27/24  Cristie Hem, PA-C  fluticasone (FLONASE) 50 MCG/ACT nasal spray Place 1-2 sprays into both nostrils at bedtime as needed for allergies or rhinitis.   Yes [provider]  gabapentin (NEURONTIN) 300 MG capsule Take 300 mg by mouth at bedtime. 11/05/22  Yes [provider]  HYDROcodone-acetaminophen (NORCO) 7.5-325 MG tablet Take 1-2 tablets by mouth every 6 (six) hours as needed for moderate pain (pain score 4-6). To be taken after surgery 04/28/23   Cristie Hem, PA-C  hydrOXYzine (ATARAX) 25 MG tablet Take 1 tablet (25 mg total) by mouth every 8 (eight) hours as needed. Patient taking differently: Take 25 mg by mouth at bedtime. 08/01/22  Yes Martin, Mary-Margaret, FNP  melatonin 5 MG TABS Take 20 mg by mouth at bedtime.   Yes [provider]  methocarbamol (ROBAXIN-750) 750 MG tablet Take 1 tablet (750 mg total) by mouth 3 (three) times daily as needed for muscle spasms. To be taken after surgery 04/28/23   Cristie Hem, PA-C  omeprazole (PRILOSEC) 40 MG capsule Take 40 mg by mouth at bedtime. 03/08/23  Yes [provider]  ondansetron (ZOFRAN) 4 MG tablet Take 1 tablet (4 mg total) by mouth every 8 (eight) hours as needed for nausea or vomiting. 04/28/23   Cristie Hem, PA-C  triamcinolone ointment (KENALOG) 0.1 % Apply 1 Application topically 2 (two) times daily.   Yes [provider]     Positive ROS: All other systems have been reviewed and were otherwise negative with the exception of those mentioned in the HPI and as above.  Physical Exam: General: Alert, no acute distress Cardiovascular: No pedal edema Respiratory: No cyanosis, no use of accessory musculature GI: abdomen soft Skin: No lesions in the area of chief complaint Neurologic: Sensation intact distally Psychiatric:  Patient is competent for consent with normal mood and affect Lymphatic: no lymphedema  MUSCULOSKELETAL: exam stable  Assessment: right total hip arthroplasty polywear  Plan: Plan for Procedure(s): RIGHT TOTAL HIP REVISION, ANTERIOR APPROACH  The risks benefits and alternatives were discussed with the patient including but not limited to the risks of nonoperative treatment, versus surgical intervention including infection, bleeding, nerve injury,  blood clots, cardiopulmonary complications, morbidity, mortality, among others, and they were willing to proceed.   Glee Arvin, MD 05/10/2023 9:13 AM

## 2023-05-11 ENCOUNTER — Encounter (HOSPITAL_COMMUNITY): Payer: Self-pay | Admitting: Orthopaedic Surgery

## 2023-05-11 MED ORDER — HYDROCODONE-ACETAMINOPHEN 7.5-325 MG PO TABS
1.0000 | ORAL_TABLET | Freq: Four times a day (QID) | ORAL | 0 refills | Status: AC | PRN
Start: 2023-05-11 — End: ?

## 2023-05-11 MED ORDER — PNEUMOCOCCAL 20-VAL CONJ VACC 0.5 ML IM SUSY: 0.5000 mL | PREFILLED_SYRINGE | INTRAMUSCULAR | Status: DC

## 2023-05-11 MED ORDER — DOXYCYCLINE HYCLATE 100 MG PO TABS: 100.0000 mg | ORAL_TABLET | Freq: Two times a day (BID) | ORAL | 0 refills | Status: AC

## 2023-05-11 MED ORDER — ASPIRIN 81 MG PO CHEW: 81.0000 mg | CHEWABLE_TABLET | Freq: Two times a day (BID) | ORAL | 0 refills | Status: AC

## 2023-05-11 MED ORDER — INFLUENZA VAC A&B SURF ANT ADJ 0.5 ML IM SUSY: 0.5000 mL | PREFILLED_SYRINGE | INTRAMUSCULAR | Status: DC

## 2023-05-11 NOTE — Plan of Care (Signed)

## 2023-05-11 NOTE — Discharge Summary (Addendum)
 Patient ID: Roy [name] MRN: [id number] DOB/AGE: Mar 18, 1955 68 y.o.  Admit date: 05/10/2023 Discharge date: 05/11/2023  Admission Diagnoses:  Principal Problem:   History of revision of total replacement of right hip joint   Discharge Diagnoses:  Same  Past Medical History:  Diagnosis Date   Arthritis    Drug addict (HCC)    clean since 03/13/1984   GERD (gastroesophageal reflux disease)    Osteoarthritis of hip    left   Psoriatic arthritis (HCC)    Seasonal allergies     Surgeries: Procedure(s): RIGHT TOTAL HIP REVISION, ANTERIOR APPROACH on 05/10/2023   Consultants:   Discharged Condition: Improved  Hospital Course: Roy [name] is an 68 y.o. male who was admitted 05/10/2023 for operative treatment ofHistory of revision of total replacement of right hip joint. Patient has severe unremitting pain that affects sleep, daily activities, and work/hobbies. After pre-op clearance the patient was taken to the operating room on 05/10/2023 and underwent  Procedure(s): RIGHT TOTAL HIP REVISION, ANTERIOR APPROACH.    Patient was given perioperative antibiotics:  Anti-infectives (From admission, onward)    Start     Dose/Rate Route Frequency Ordered Stop   05/11/23 0000  doxycycline (VIBRA-TABS) 100 MG tablet        100 mg Oral 2 times daily 05/11/23 0805     05/10/23 1545  ceFAZolin (ANCEF) IVPB 2g/100 mL premix        2 g 200 mL/hr over 30 Minutes Intravenous Every 6 hours 05/10/23 1455 05/11/23 0009   05/10/23 1207  vancomycin (VANCOCIN) powder  Status:  Discontinued          As needed 05/10/23 1207 05/10/23 1326   05/10/23 0745  ceFAZolin (ANCEF) IVPB 2g/100 mL premix        2 g 200 mL/hr over 30 Minutes Intravenous On call to O.R. 05/10/23 0743 05/10/23 1059   05/10/23 0000  doxycycline (VIBRA-TABS) 100 MG tablet  Status:  Discontinued        100 mg Oral 2 times daily 05/10/23 1404 05/11/23         Patient was given sequential compression devices, early  ambulation, and chemoprophylaxis to prevent DVT.  Patient benefited maximally from hospital stay and there were no complications.    Recent vital signs: Patient Vitals for the past 24 hrs:  BP Temp Temp src Pulse Resp SpO2  05/11/23 0905 133/76 98 F (36.7 C) Oral 86 20 90 %  05/11/23 0522 (!) 141/83 98.1 F (36.7 C) Oral 80 20 90 %  05/11/23 0035 (!) 142/70 98.2 F (36.8 C) Oral 75 18 90 %  05/10/23 2230 (!) 143/71 98.6 F (37 C) -- 82 18 92 %  05/10/23 1500 132/63 97.7 F (36.5 C) Oral 64 16 --  05/10/23 1425 -- -- -- 63 15 98 %  05/10/23 1415 121/61 97.7 F (36.5 C) -- 62 10 97 %  05/10/23 1400 (!) 115/58 -- -- 71 11 (!) 88 %  05/10/23 1345 (!) 117/56 -- -- 68 (!) 8 97 %  05/10/23 1330 (!) 99/57 97.9 F (36.6 C) -- 74 11 96 %     Recent laboratory studies: No results for input(s): "WBC", "HGB", "HCT", "PLT", "NA", "K", "CL", "CO2", "BUN", "CREATININE", "GLUCOSE", "INR", "CALCIUM" in the last 72 hours.  Invalid input(s): "PT", "2"   Discharge Medications:   Allergies as of 05/11/2023       Reactions   Demerol [meperidine] Nausea And Vomiting   Morphine And Codeine Nausea And  Vomiting   Oxycodone Nausea And Vomiting        Medication List     TAKE these medications    aspirin 81 MG chewable tablet Commonly known as: Aspirin 81 Chew 1 tablet (81 mg total) by mouth 2 (two) times daily. To be taken after surgery to prevent blood clots What changed: Another medication with the same name was removed. Continue taking this medication, and follow the directions you see here.   cetirizine 10 MG tablet Commonly known as: ZYRTEC Take 10 mg by mouth at bedtime.   chlorhexidine 4 % external liquid Commonly known as: HIBICLENS Apply 15 mLs (1 Application total) topically as directed for 30 doses. Use as directed daily for 5 days every other week for 6 weeks.   docusate sodium 100 MG capsule Commonly known as: Colace Take 1 capsule (100 mg total) by mouth daily as  needed. What changed: Another medication with the same name was removed. Continue taking this medication, and follow the directions you see here.   doxycycline 100 MG tablet Commonly known as: VIBRA-TABS Take 1 tablet (100 mg total) by mouth 2 (two) times daily.   fluticasone 50 MCG/ACT nasal spray Commonly known as: FLONASE Place 1-2 sprays into both nostrils at bedtime as needed for allergies or rhinitis.   gabapentin 300 MG capsule Commonly known as: NEURONTIN Take 300 mg by mouth at bedtime.   HYDROcodone-acetaminophen 7.5-325 MG tablet Commonly known as: NORCO Take 1-2 tablets by mouth every 6 (six) hours as needed for moderate pain (pain score 4-6). To be taken after surgery   hydrOXYzine 25 MG tablet Commonly known as: ATARAX Take 1 tablet (25 mg total) by mouth every 8 (eight) hours as needed. What changed: when to take this   melatonin 5 MG Tabs Take 20 mg by mouth at bedtime.   methocarbamol 750 MG tablet Commonly known as: Robaxin-750 Take 1 tablet (750 mg total) by mouth 3 (three) times daily as needed for muscle spasms. To be taken after surgery   mupirocin ointment 2 % Commonly known as: BACTROBAN Place 1 Application into the nose 2 (two) times daily for 60 doses. Use as directed 2 times daily for 5 days every other week for 6 weeks.   omeprazole 40 MG capsule Commonly known as: PRILOSEC Take 40 mg by mouth at bedtime.   ondansetron 4 MG tablet Commonly known as: Zofran Take 1 tablet (4 mg total) by mouth every 8 (eight) hours as needed for nausea or vomiting.   triamcinolone ointment 0.1 % Commonly known as: KENALOG Apply 1 Application topically 2 (two) times daily.               Durable Medical Equipment  (From admission, onward)           Start     Ordered   05/10/23 1456  DME Walker rolling  Once       Question:  Patient needs a walker to treat with the following condition  Answer:  History of hip replacement   05/10/23 1455    05/10/23 1456  DME 3 n 1  Once        05/10/23 1455   05/10/23 1456  DME Bedside commode  Once       Question:  Patient needs a bedside commode to treat with the following condition  Answer:  History of hip replacement   05/10/23 1455            Diagnostic Studies: DG HIP UNILAT  WITH PELVIS 2-3 VIEWS RIGHT Result Date: 05/10/2023 CLINICAL DATA:  Elective surgery.  Revision right hip arthroplasty. EXAM: DG HIP (WITH OR WITHOUT PELVIS) 2-3V RIGHT COMPARISON:  None Available. FINDINGS: Three fluoroscopic spot views of the pelvis and right hip obtained in the operating room. Imaging obtained during revision right hip arthroplasty. Fluoroscopy time 6 seconds. Dose 1.18 mGy. IMPRESSION: Intraoperative fluoroscopy during revision right hip arthroplasty. Electronically Signed   By: Narda Rutherford M.D.   On: 05/10/2023 14:55   DG Pelvis Portable Result Date: 05/10/2023 CLINICAL DATA:  Postop. EXAM: PORTABLE PELVIS 1-2 VIEWS COMPARISON:  Preoperative imaging FINDINGS: Right hip arthroplasty in expected alignment. No fracture. Lucency adjacent to the femoral component about the lesser trochanter was present on preoperative exam. Recent postsurgical change includes air and edema in the soft tissues. Previous left hip arthroplasty. IMPRESSION: Right hip arthroplasty in expected alignment. Electronically Signed   By: Narda Rutherford M.D.   On: 05/10/2023 14:54   DG C-Arm 1-60 Min-No Report Result Date: 05/10/2023 Fluoroscopy was utilized by the requesting physician.  No radiographic interpretation.   DG C-Arm 1-60 Min-No Report Result Date: 05/10/2023 Fluoroscopy was utilized by the requesting physician.  No radiographic interpretation.    Disposition: Discharge disposition: 03-Skilled Nursing Facility          Contact information for follow-up providers     Cristie Hem, PA-C. Schedule an appointment as soon as possible for a visit in 1 week(s).   Specialty: Orthopedic Surgery Contact  information: [address] [place] [address] [phone]              Contact information for after-discharge care     Destination     [facility] Preferred SN[facility] .   Service: Skilled Nursing Contact information: [address] [place] [address] [phone]                      Signed: Cristie Hem 05/11/2023, 11:15 AM

## 2023-05-11 NOTE — TOC Initial Note (Addendum)
 Transition of Care [facility]) - Initial/Assessment Note    Patient Details  Name: Roy [name] MRN: [id number] Date of Birth: November 11, 1955  Transition of Care [facility]) CM/SW Contact:    Roy Frederick, LCSW Phone Number: 05/11/2023, 8:47 AM  Clinical Narrative:    CSW met with pt regarding PT recommendation for SNF.  Pt is agreeable to this, was at [facility] in the past and would like to return there.  Pt from home with wife Roy [name], permission given to speak with her.  Permission given to send referral in the hub.  Referral sent to Roy [name], CSW reached out to Roy [name] to review.             0930: Roy [name] does offer bed.  1020: Auth request submitted in [place] and approved: [id number], 3 days: 3/18-3/20.        Expected Discharge Plan: Skilled Nursing Facility Barriers to Discharge: SNF Pending bed offer   Patient Goals and CMS Choice Patient states their goals for this hospitalization and ongoing recovery are:: get my strength back   Choice offered to / list presented to : Patient (requesting [facility])      Expected Discharge Plan and Services In-house Referral: Clinical Social Work   Post Acute Care Choice: Skilled Nursing Facility Living arrangements for the past 2 months: Single Family Home Expected Discharge Date: 05/11/23                                    Prior Living Arrangements/Services Living arrangements for the past 2 months: Single Family Home Lives with:: Spouse Patient language and need for interpreter reviewed:: Yes Do you feel safe going back to the place where you live?: Yes      Need for Family Participation in Patient Care: No (Comment) Care giver support system in place?: Yes (comment) Current home services: Other (comment) (none) Criminal Activity/Legal Involvement Pertinent to Current Situation/Hospitalization: No - Comment as needed  Activities of Daily Living   ADL Screening (condition at time of admission) Independently performs ADLs?:  Yes (appropriate for developmental age) Is the patient deaf or have difficulty hearing?: No Does the patient have difficulty seeing, even when wearing glasses/contacts?: Yes Does the patient have difficulty concentrating, remembering, or making decisions?: No  Permission Sought/Granted Permission sought to share information with : Family Supports Permission granted to share information with : Yes, Verbal Permission Granted  Share Information with NAME: wife Roy [name]  Permission granted to share info w AGENCY: SNF        Emotional Assessment Appearance:: Appears stated age Attitude/Demeanor/Rapport: Engaged Affect (typically observed): Appropriate, Pleasant Orientation: : Oriented to Self, Oriented to Place, Oriented to  Time, Oriented to Situation      Admission diagnosis:  History of revision of total replacement of right hip joint [Z96.641] Patient Active Problem List   Diagnosis Date Noted   History of revision of total replacement of right hip joint 05/10/2023   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   Weakness of left quadriceps muscle 07/26/2022   OA (osteoarthritis) of hip 03/11/2022   Hx of total hip arthroplasty, left 03/11/2022   History of total hip arthroplasty, right 02/06/2022   Elbow fracture, left 11/24/2019   Olecranon fracture, left, closed, initial encounter 11/21/2019   Leg laceration, left, initial encounter 11/21/2019   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 AT CORNER OF [facility] [address] [place] [address] Phone: [phone] Fax: [phone]     Social Drivers of  Health (SDOH) Social History: SDOH Screenings   Food Insecurity: No Food Insecurity (05/10/2023)  Housing: Low Risk  (05/10/2023)  Transportation Needs: No Transportation Needs (05/10/2023)  Utilities: Not At Risk (05/10/2023)  Social Connections: Moderately Isolated (05/10/2023)  Tobacco Use: Medium Risk (05/10/2023)   SDOH Interventions:     Readmission Risk Interventions     No data to display

## 2023-05-11 NOTE — Progress Notes (Signed)
 Physical Therapy Treatment Patient Details Name: Roy [name] MRN: [id number] DOB: March 24, 1955 Today's Date: 05/11/2023   History of Present Illness 68 y/o male presents [facility] 3[facility]17 s/p R THA revision (anterior approach). PMH includes: OA and psoriatic arthritis, L THA.    PT Comments  Pt tolerates treatment well but continues to demonstrate limited functional mobility at this time. Pt requires significant assistance to sit at the edge of bed and is unable to stand due to  BLE weakness and a lack of knee ROM. Pt remains at a very high risk for falls. Patient will benefit from continued inpatient follow up therapy, <3 hours/day.    If plan is discharge home, recommend the following: A lot of help with walking and/or transfers;A little help with bathing/dressing/bathroom;Assistance with cooking/housework;Assist for transportation;Help with stairs or ramp for entrance   Can travel by private vehicle     No  Equipment Recommendations  Hoyer lift;Hospital bed;Wheelchair (measurements PT)    Recommendations for Other Services       Precautions / Restrictions Precautions Precautions: Other (comment);Anterior Hip (wound vac, bilateral knee ROM restrictions) Precaution Booklet Issued: Yes (comment) Recall of Precautions/Restrictions: Intact Restrictions Weight Bearing Restrictions Per Provider Order: Yes RLE Weight Bearing Per Provider Order: Weight bearing as tolerated     Mobility  Bed Mobility Overal bed mobility: Needs Assistance Bed Mobility: Supine to Sit, Sit to Supine     Supine to sit: Max assist, HOB elevated Sit to supine: Max assist, HOB elevated        Transfers Overall transfer level: Needs assistance Equipment used: Rolling walker (2 wheels) Transfers: Sit to/from Stand Sit to Stand:  (unable to successfully stand despite elevation of bed, lacks sufficient knee flexion and hip flexion)                Ambulation/Gait                    Stairs             Wheelchair Mobility     Tilt Bed    Modified Rankin (Stroke Patients Only)       Balance Overall balance assessment: Needs assistance Sitting-balance support: Bilateral upper extremity supported, Feet supported Sitting balance-Leahy Scale: Poor Sitting balance - Comments: posterior lean Postural control: Posterior lean                                  Communication Communication Communication: No apparent difficulties  Cognition Arousal: Alert Behavior During Therapy: WFL for tasks assessed/performed   PT - Cognitive impairments: No apparent impairments                         Following commands: Intact      Cueing Cueing Techniques: Verbal cues, Tactile cues  Exercises Total Joint Exercises Hip ABduction/ADduction:  (PT encourages hip abduction attempts without use of leg lifter) Other Exercises Other Exercises: pt is unable to perform heel slides due to lack of knee flexion. PT encourages attempts at rows in bed with support of bed rails to aide in increasinig hip flexion ROM    General Comments General comments (skin integrity, edema, etc.): VSS on RA      Pertinent Vitals/Pain Pain Assessment Pain Assessment: Faces Faces Pain Scale: Hurts even more Pain Location: R hip Pain Descriptors / Indicators: Aching Pain Intervention(s): Monitored during session    Home  Living                          Prior Function            PT Goals (current goals can now be found in the care plan section) Acute Rehab PT Goals Patient Stated Goal: Attend cocnert on May 9 Progress towards PT goals: Progressing toward goals (very slowly)    Frequency    Min 2X/week      PT Plan      Co-evaluation              AM-PAC PT "6 Clicks" Mobility   Outcome Measure  Help needed turning from your back to your side while in a flat bed without using bedrails?: A Lot Help needed moving from lying on  your back to sitting on the side of a flat bed without using bedrails?: A Lot Help needed moving to and from a bed to a chair (including a wheelchair)?: Total Help needed standing up from a chair using your arms (e.g., wheelchair or bedside chair)?: Total Help needed to walk in hospital room?: Total Help needed climbing 3-5 steps with a railing? : Total 6 Click Score: 8    End of Session Equipment Utilized During Treatment: Gait belt Activity Tolerance: Patient tolerated treatment well Patient left: in bed;with call bell/phone within reach;with bed alarm set Nurse Communication: Need for lift equipment;Mobility status PT Visit Diagnosis: Muscle weakness (generalized) (M62.81);Pain;Other abnormalities of gait and mobility (R26.89) Pain - Right/Left: Right Pain - part of body: Hip     Time: 0927-0955 PT Time Calculation (min) (ACUTE ONLY): 28 min  Charges:    $Therapeutic Activity: 23-37 mins PT General Charges $$ ACUTE PT VISIT: 1 Visit                     Arlyss Gandy, PT, DPT Acute Rehabilitation Office [phone]    Arlyss Gandy 05/11/2023, 10:06 AM

## 2023-05-11 NOTE — [place] FL2 (Signed)
 [place] MEDICAID FL2 LEVEL OF CARE FORM     IDENTIFICATION  Patient Name: Roy [name] Birthdate: August 07, 1955 Sex: male Admission Date (Current Location): 05/10/2023  [facility] and [place] Number:  Producer, television/film/video and Address:  [facility], [address], [place], [place] [address]      Provider Number: [id number]  Attending Physician Name and Address:  Tarry Kos, MD  Relative Name and Phone Number:  Fasig,Claudia Spouse [phone]  [phone]    Current Level of Care: Hospital Recommended Level of Care: Skilled Nursing Facility Prior Approval Number:    Date Approved/Denied:   PASRR Number: [id number]  Discharge Plan: SNF    Current Diagnoses: Patient Active Problem List   Diagnosis Date Noted   History of revision of total replacement of right hip joint 05/10/2023   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   Weakness of left quadriceps muscle 07/26/2022   OA (osteoarthritis) of hip 03/11/2022   Hx of total hip arthroplasty, left 03/11/2022   History of total hip arthroplasty, right 02/06/2022   Elbow fracture, left 11/24/2019   Olecranon fracture, left, closed, initial encounter 11/21/2019   Leg laceration, left, initial encounter 11/21/2019   Osteomyelitis of knee region [facility]) 08/03/2012   Psoriatic arthritis, 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    Orientation RESPIRATION BLADDER Height & Weight     Self, Time, Situation, Place  Normal Continent Weight: 230 lb (104.3 kg) Height:  5\' 6"  (167.6 cm)  BEHAVIORAL SYMPTOMS/MOOD NEUROLOGICAL BOWEL NUTRITION STATUS      Continent Diet (see discharge summary)  AMBULATORY STATUS COMMUNICATION OF NEEDS Skin   Extensive Assist Verbally Surgical wounds                       Personal Care Assistance Level of Assistance  Bathing, Feeding, Dressing Bathing Assistance: Limited assistance Feeding assistance: Independent Dressing Assistance: Limited assistance      Functional Limitations Info  Sight, Hearing, Speech Sight Info: Adequate Hearing Info: Adequate Speech Info: Adequate    SPECIAL CARE FACTORS FREQUENCY  PT (By licensed PT), OT (By licensed OT)     PT Frequency: 5x week OT Frequency: 5x week            Contractures Contractures Info: Not present    Additional Factors Info  Code Status, Allergies Code Status Info: full Allergies Info: Demerol (Meperidine), Morphine And Codeine, Oxycodone           Current Medications (05/11/2023):  This is the current hospital active medication list Current Facility-Administered Medications  Medication Dose Route Frequency Provider Last Rate Last Admin   acetaminophen (TYLENOL) tablet 325-650 mg  325-650 mg Oral Q6H PRN Tarry Kos, MD       acetaminophen (TYLENOL) tablet 500 mg  500 mg Oral Q6H Tarry Kos, MD   500 mg at 05/11/23 0546   alum & mag hydroxide-simeth (MAALOX/MYLANTA) 200-200-20 MG/5ML suspension 30 mL  30 mL Oral Q4H PRN Tarry Kos, MD       aspirin chewable tablet 81 mg  81 mg Oral BID Tarry Kos, MD   81 mg at 05/10/23 2330   dexamethasone (DECADRON) injection 10 mg  10 mg Intravenous Once Tarry Kos, MD       diphenhydrAMINE (BENADRYL) 12.5 MG/5ML elixir 25 mg  25 mg Oral Q4H PRN Tarry Kos, MD       docusate sodium (COLACE) capsule 100 mg  100 mg Oral  BID Tarry Kos, MD   100 mg at 05/10/23 2337   gabapentin (NEURONTIN) capsule 300 mg  300 mg Oral QHS Tarry Kos, MD   300 mg at 05/10/23 2328   HYDROcodone-acetaminophen (NORCO) 7.5-325 MG per tablet 1-2 tablet  1-2 tablet Oral Q4H PRN Tarry Kos, MD   1 tablet at 05/10/23 1620   HYDROcodone-acetaminophen (NORCO/VICODIN) 5-325 MG per tablet 1-2 tablet  1-2 tablet Oral Q4H PRN Tarry Kos, MD   2 tablet at 05/10/23 1424   [START ON 05/12/2023] influenza vaccine adjuvanted (FLUAD) injection 0.5 mL  0.5 mL Intramuscular Tomorrow-1000 Tarry Kos, MD       magnesium citrate solution 1 Bottle  1  Bottle Oral Once PRN Tarry Kos, MD       menthol-cetylpyridinium (CEPACOL) lozenge 3 mg  1 lozenge Oral PRN Tarry Kos, MD       Or   phenol (CHLORASEPTIC) mouth spray 1 spray  1 spray Mouth/Throat PRN Tarry Kos, MD       methocarbamol (ROBAXIN) tablet 500 mg  500 mg Oral Q6H PRN Tarry Kos, MD   500 mg at 05/10/23 1424   Or   methocarbamol (ROBAXIN) injection 500 mg  500 mg Intravenous Q6H PRN Tarry Kos, MD       metoCLOPramide (REGLAN) tablet 5-10 mg  5-10 mg Oral Q8H PRN Tarry Kos, MD       Or   metoCLOPramide (REGLAN) injection 5-10 mg  5-10 mg Intravenous Q8H PRN Tarry Kos, MD       ondansetron [facility]) tablet 4 mg  4 mg Oral Q6H PRN Tarry Kos, MD       Or   ondansetron [facility]) injection 4 mg  4 mg Intravenous Q6H PRN Tarry Kos, MD       pantoprazole (PROTONIX) EC tablet 40 mg  40 mg Oral Daily Tarry Kos, MD   40 mg at 05/10/23 1620   [START ON 05/12/2023] pneumococcal 20-valent conjugate vaccine (PREVNAR 20) injection 0.5 mL  0.5 mL Intramuscular Tomorrow-1000 Tarry Kos, MD       polyethylene glycol (MIRALAX / GLYCOLAX) packet 17 g  17 g Oral Daily Tarry Kos, MD       sorbitol 70 % solution 30 mL  30 mL Oral Daily PRN Tarry Kos, MD       tranexamic acid (CYKLOKAPRON) 2,000 mg in sodium chloride 0.9 % 50 mL Topical Application  2,000 mg Topical To OR Tarry Kos, MD         Discharge Medications: Please see discharge summary for a list of discharge medications.  Relevant Imaging Results:  Relevant Lab Results:   Additional Information SS# [id number]  Lorri Frederick, LCSW

## 2023-05-11 NOTE — Progress Notes (Signed)
 Subjective: 1 Day Post-Op Procedure(s) (LRB): RIGHT TOTAL HIP REVISION, ANTERIOR APPROACH (Right) Patient reports pain as mild.  No complaints this am.  Worried about going home as wife ambulates with a walker and unable to physically help him.  Adamant he needs to go to SNF  Objective: Vital signs in last 24 hours: Temp:  [97.7 F (36.5 C)-98.6 F (37 C)] 98.1 F (36.7 C) (03/18 0522) Pulse Rate:  [62-82] 80 (03/18 0522) Resp:  [8-20] 20 (03/18 0522) BP: (99-151)/(56-83) 141/83 (03/18 0522) SpO2:  [88 %-98 %] 90 % (03/18 0522) Weight:  [104.3 kg] 104.3 kg (03/17 0807)  Intake/Output from previous day: 03/17 0701 - 03/18 0700 In: 1100 [I.V.:1000; IV Piggyback:100] Out: 1200 [Urine:950; Blood:250] Intake/Output this shift: No intake/output data recorded.  No results for input(s): "HGB" in the last 72 hours. No results for input(s): "WBC", "RBC", "HCT", "PLT" in the last 72 hours. No results for input(s): "NA", "K", "CL", "CO2", "BUN", "CREATININE", "GLUCOSE", "CALCIUM" in the last 72 hours. No results for input(s): "LABPT", "INR" in the last 72 hours.  Neurologically intact Neurovascular intact Sensation intact distally Intact pulses distally Dorsiflexion/Plantar flexion intact Incision: dressing C/D/I No cellulitis present Compartment soft Ivac in place with good seal.  No fluid in canister   Assessment/Plan: 1 Day Post-Op Procedure(s) (LRB): RIGHT TOTAL HIP REVISION, ANTERIOR APPROACH (Right) Advance diet Up with therapy D/C IV fluids Discharge to SNF once insurance approves and bed available (patient prefers [facility]) WBAT RLE Continue Ivac.  Please replace hospital unit with portable prevena DAY OF DISCHARGE (no sooner)       Cristie Hem 05/11/2023, 8:03 AM

## 2023-05-11 NOTE — TOC Transition Note (Signed)
 Transition of Care [facility]) - Discharge Note   Patient Details  Name: Roy [name] MRN: [id number] Date of Birth: [birth year]  [facility]) CM/SW Contact:  Lorri Frederick, LCSW Phone Number: 05/11/2023, 10:47 AM   Clinical Narrative:   Pt discharging to [place], room 801B.  RN call report to [phone].      Final next level of care: Skilled Nursing Facility Barriers to Discharge: Barriers Resolved   Patient Goals and CMS Choice Patient states their goals for this hospitalization and ongoing recovery are:: get my strength back   Choice offered to / list presented to : Patient (requesting [facility] health)      Discharge Placement              Patient chooses bed at: [facility] Patient to be transferred to facility by: ptar Name of family member notified: wife Debarah Crape Patient and family notified of of transfer: 05/11/23  Discharge Plan and Services Additional resources added to the After Visit Summary for   In-house Referral: Clinical Social Work   Post Acute Care Choice: Skilled Nursing Facility                               Social Drivers of Health (SDOH) Interventions SDOH Screenings   Food Insecurity: No Food Insecurity (05/10/2023)  Housing: Low Risk  (05/10/2023)  Transportation Needs: No Transportation Needs (05/10/2023)  Utilities: Not At Risk (05/10/2023)  Social Connections: Moderately Isolated (05/10/2023)  Tobacco Use: Medium Risk (05/10/2023)     Readmission Risk Interventions     No data to display

## 2023-05-11 NOTE — Evaluation (Signed)
 Occupational Therapy Evaluation Patient Details Name: Roy [name] MRN: [id number] DOB: 12-03-1955 Today's Date: 05/11/2023   History of Present Illness   68 y/o male presents to [facility] on 3/17 s/p R THA revision (anterior approach). PMH includes: OA and psoriatic arthritis, L THA.     Clinical Impressions Pt admitted based on above, and was seen based on problem list below. PTA pt was living with wife and was independent/mod I with ADLs and IADLs. Today pt is requiring set up to total assist for ADLs. Bed mobility and functional transfers were declined d/t fatigue from previous PT session. Transport present at end of session. Recommendation of <3 hours of skilled rehab daily to optimize independence levels. OT will continue to follow acutely to maximize functional independence.      If plan is discharge home, recommend the following:   A lot of help with walking and/or transfers;A lot of help with bathing/dressing/bathroom;Assistance with cooking/housework;Assist for transportation;Help with stairs or ramp for entrance     Functional Status Assessment   Patient has had a recent decline in their functional status and demonstrates the ability to make significant improvements in function in a reasonable and predictable amount of time.     Equipment Recommendations   Other (comment) (Defer to next venue)     Recommendations for Other Services         Precautions/Restrictions   Precautions Precautions: Other (comment);Anterior Hip (Wound vac, Bil knee restrictions) Precaution Booklet Issued: Yes (comment) Recall of Precautions/Restrictions: Intact Restrictions Weight Bearing Restrictions Per Provider Order: Yes RLE Weight Bearing Per Provider Order: Weight bearing as tolerated     Mobility Bed Mobility Overal bed mobility: Needs Assistance             General bed mobility comments: Pt declined stating "I just tried that"    Transfers                    General transfer comment: Pt declined      Balance Overall balance assessment: Needs assistance                                         ADL either performed or assessed with clinical judgement   ADL Overall ADL's : Needs assistance/impaired Eating/Feeding: Set up;Bed level   Grooming: Set up;Bed level           Upper Body Dressing : Minimal assistance;Bed level   Lower Body Dressing: Total assistance;Bed level Lower Body Dressing Details (indicate cue type and reason): Pt mod I at baseline, uses dressing stick               General ADL Comments: Pt declined mobility stated " I just tried that"     Vision Baseline Vision/History: 1 Wears glasses Vision Assessment?: No apparent visual deficits     Perception         Praxis         Pertinent Vitals/Pain Pain Assessment Pain Assessment: Faces Faces Pain Scale: Hurts little more Pain Location: R hip Pain Descriptors / Indicators: Aching Pain Intervention(s): Premedicated before session, Monitored during session     Extremity/Trunk Assessment Upper Extremity Assessment Upper Extremity Assessment: Right hand dominant;RUE deficits/detail RUE Deficits / Details: arthritis in elbow, no elbow flex, decreased supination RUE Sensation: WNL   Lower Extremity Assessment Lower Extremity Assessment: Defer to PT evaluation   Cervical / Trunk  Assessment Cervical / Trunk Assessment: Normal   Communication Communication Communication: No apparent difficulties   Cognition Arousal: Alert Behavior During Therapy: WFL for tasks assessed/performed Cognition: No apparent impairments                               Following commands: Intact       Cueing  General Comments   Cueing Techniques: Verbal cues;Tactile cues  VSS on RA   Exercises     Shoulder Instructions      Home Living Family/patient expects to be discharged to:: Private residence Living Arrangements:  Spouse/significant other   Type of Home: House Home Access: Ramped entrance;Stairs to enter Entrance Stairs-Number of Steps: 1 Entrance Stairs-Rails: None Home Layout: One level     Bathroom Shower/Tub: Producer, television/film/video: Handicapped height Bathroom Accessibility: Yes How Accessible: Accessible via walker Home Equipment: Rolling Walker (2 wheels);Grab bars - tub/shower;Grab bars - toilet;Lift chair;Standard Walker;Cane - single point;Rollator (4 wheels);BSC/3in1   Additional Comments: Uses a lift chair to aid in standing      Prior Functioning/Environment Prior Level of Function : Independent/Modified Independent;Driving             Mobility Comments: Uses rollator at baseline ADLs Comments: Reports independent with ADLs and IADLs, uses dressing stick for LB dressing    OT Problem List: Decreased strength;Decreased activity tolerance;Decreased range of motion;Impaired balance (sitting and/or standing);Decreased knowledge of use of DME or AE;Pain   OT Treatment/Interventions: Self-care/ADL training;Therapeutic exercise;Energy conservation;DME and/or AE instruction;Modalities;Therapeutic activities;Patient/family education;Balance training      OT Goals(Current goals can be found in the care plan section)   Acute Rehab OT Goals Patient Stated Goal: To go to rehab OT Goal Formulation: With patient Time For Goal Achievement: 05/25/23 Potential to Achieve Goals: Good   OT Frequency:  Min 2X/week    Co-evaluation              AM-PAC OT "6 Clicks" Daily Activity     Outcome Measure Help from another person eating meals?: None Help from another person taking care of personal grooming?: A Little Help from another person toileting, which includes using toliet, bedpan, or urinal?: Total Help from another person bathing (including washing, rinsing, drying)?: A Lot Help from another person to put on and taking off regular upper body clothing?: A  Little Help from another person to put on and taking off regular lower body clothing?: Total 6 Click Score: 14   End of Session Nurse Communication: Mobility status  Activity Tolerance: Patient limited by pain;Patient limited by fatigue Patient left: in bed;with call bell/phone within reach;Other (comment) (Transport in room)  OT Visit Diagnosis: Unsteadiness on feet (R26.81);Other abnormalities of gait and mobility (R26.89);Muscle weakness (generalized) (M62.81)                Time: 1610-9604 OT Time Calculation (min): 13 min Charges:  OT General Charges $OT Visit: 1 Visit OT Evaluation $OT Eval Moderate Complexity: 1 Mod  Ivor Messier, OT  Acute Rehabilitation Services Office [phone] Secure chat preferred   Marilynne Drivers 05/11/2023, 11:32 AM

## 2023-05-12 ENCOUNTER — Telehealth: Payer: Self-pay

## 2023-05-12 NOTE — Telephone Encounter (Signed)
 Roy [name], PT with [facility] needs clarification on PT for patients right hip.  Patient had right hip surgery on 05/10/2023.  CB# [phone].  Please advise.  Thank you.

## 2023-05-12 NOTE — Telephone Encounter (Signed)
 No hip precautions

## 2023-05-12 NOTE — Telephone Encounter (Signed)
 Called and relayed message to Windell Moulding.

## 2023-05-18 ENCOUNTER — Telehealth: Payer: Self-pay | Admitting: Physician Assistant

## 2023-05-18 ENCOUNTER — Encounter: Payer: PRIVATE HEALTH INSURANCE | Admitting: Physician Assistant

## 2023-05-18 NOTE — Telephone Encounter (Signed)
 Patient called and said could you please give him a call. He thinks it was too soon to come in for post op. He in pain. [phone]

## 2023-05-19 ENCOUNTER — Telehealth: Payer: Self-pay | Admitting: Radiology

## 2023-05-19 NOTE — Telephone Encounter (Signed)
 Robin the wound care nurse at [facility] called, states that the patient called and cancelled his appointment yesterday, now the wound vac is and has stopped, she is needing to know what they should do now. Please call her @ [phone]

## 2023-05-19 NOTE — Telephone Encounter (Signed)
 They can take it off and apply a sterile bandage such as aquacel/mepilex

## 2023-05-20 ENCOUNTER — Telehealth: Payer: Self-pay | Admitting: Physician Assistant

## 2023-05-20 ENCOUNTER — Telehealth: Payer: Self-pay | Admitting: Orthopaedic Surgery

## 2023-05-20 NOTE — Telephone Encounter (Signed)
 Patient called. Says [facility] needs approval for him to take his anxiety medication. Would like Dr. Roda Shutters to call to give that approval. He had an attack today and needs his medication.

## 2023-05-20 NOTE — Telephone Encounter (Signed)
 Fine by [place]

## 2023-05-20 NOTE — Telephone Encounter (Signed)
 Spoke with [employer]. Wound vac has been removed and sterile bandage applied.

## 2023-05-20 NOTE — Telephone Encounter (Signed)
 Tried to call yesterday but they can not get her to the phone

## 2023-05-20 NOTE — Telephone Encounter (Signed)
 Spoke with [employer]. She said that they have already reached out to their physician and patient must see their psychiatrist tomorrow before being allowed to have his anxiety meds.

## 2023-05-20 NOTE — Telephone Encounter (Signed)
 I called and spoke with Zella Ball and advised her of [place] response. She said that we perfect and that's what she needed to know

## 2023-05-20 NOTE — Telephone Encounter (Signed)
 Called Robin no answer LMOM. Just need to Advise on msg below.

## 2023-05-20 NOTE — Telephone Encounter (Signed)
 Roy [name] from [facility] called and ask since the Vac is dead she wants to know what to do with it and how to dress it.CB#[phone]

## 2023-05-20 NOTE — Telephone Encounter (Signed)
 Sent message about this yesterday.  They can take off and apply sterile dressing such as aquacel or mepilex

## 2023-05-20 NOTE — Telephone Encounter (Signed)
 Tried again. No answer. LMOM for Robin to call us back so that we can advise about wound vac.

## 2023-05-25 ENCOUNTER — Ambulatory Visit (INDEPENDENT_AMBULATORY_CARE_PROVIDER_SITE_OTHER): Payer: PRIVATE HEALTH INSURANCE | Admitting: Physician Assistant

## 2023-05-25 ENCOUNTER — Other Ambulatory Visit (INDEPENDENT_AMBULATORY_CARE_PROVIDER_SITE_OTHER): Payer: Self-pay

## 2023-05-25 ENCOUNTER — Encounter: Payer: PRIVATE HEALTH INSURANCE | Admitting: Physician Assistant

## 2023-05-25 DIAGNOSIS — Z96641 Presence of right artificial hip joint: Secondary | ICD-10-CM

## 2023-05-25 NOTE — Progress Notes (Signed)
 Post-Op Visit Note   Patient: Roy [name]           Date of Birth: 1955-12-06           MRN: [id number] Visit Date: 05/25/2023 PCP: [name], MD   Assessment & Plan:  Chief Complaint:  Chief Complaint  Patient presents with   Right Hip - Routine Post Op    05/10/23-Right Total Hip Revision, Anterior Approach - Right     Visit Diagnoses:  1. S/p revision of right total hip     Plan: Patient is a pleasant 68 year old gentleman who comes in today 2 weeks status post right hip revision polyliner and bone grafting, date of surgery 05/10/2023.  He has been at [facility].  He tells me he is getting physical therapy and is ambulating with a walker.  His pain has significantly improved.  He was discharged with an IVAC which was removed and changed to a dry dressing a week ago.  According to the facility papers, he has been getting a baby aspirin twice daily for DVT prophylaxis.  Examination of the right hip: Well-healed surgical scar with nylon sutures in place.  No evidence of infection or cellulitis.  Calf soft nontender.  He is neurovascularly intact distally.  Today, sutures were removed and Steri-Strips applied.  He will continue with a baby aspirin twice daily until he is 6 weeks postop.  He will follow-up with [place] in 4 weeks for repeat evaluation and AP pelvis x-rays.  Call with concerns or questions.  Follow-Up Instructions: Return in about 4 weeks (around 06/22/2023).   Orders:  Orders Placed This Encounter  Procedures   XR HIP UNILAT W OR W/O PELVIS 2-3 VIEWS RIGHT   No orders of the defined types were placed in this encounter.   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   Weakness of left  quadriceps muscle 07/26/2022   OA (osteoarthritis) of hip 03/11/2022   Hx of total hip arthroplasty, left 03/11/2022   History of total hip arthroplasty, right 02/06/2022   Elbow fracture, left 11/24/2019   Olecranon fracture, left, closed, initial encounter 11/21/2019   Leg laceration, left, initial encounter 11/21/2019   Osteomyelitis of knee region (HCC) 08/03/2012   Psoriatic arthritis, 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  Past Surgical History:  Procedure Laterality Date   EXCISIONAL TOTAL KNEE ARTHROPLASTY WITH ANTIBIOTIC SPACERS Right 07/07/2012   Procedure: EXCISIONAL TOTAL KNEE ARTHROPLASTY WITH ANTIBIOTIC SPACERS;  Surgeon: Valeria Batman, MD;  Location: [facility] OR;  Service: Orthopedics;  Laterality: Right;  Irrigation and Debridement Right Total Knee Replacement, Insertion of ABX Spacer   KNEE ARTHROSCOPY Right 07/04/2012   Procedure: ARTHROSCOPY I&D KNEE;  Surgeon: Nadara Mustard, MD;  Location: [facility] OR;  Service: Orthopedics;  Laterality: Right;   ORIF ELBOW FRACTURE Left 11/24/2019   Procedure: BIOPSY, OPEN REDUCTION INTERNAL FIXATION (ORIF) LEFT ELBOW/OLECRANON FRACTURE;  Surgeon: Eldred Manges, MD;  Location: [facility] OR;  Service: Orthopedics;  Laterality: Left;   ORIF TIBIA FRACTURE  2008   TOTAL HIP ARTHROPLASTY Right 2001   TOTAL HIP ARTHROPLASTY Left 03/11/2022   Procedure: LEFT TOTAL HIP ARTHROPLASTY;  Surgeon: Eldred Manges, MD;  Location: [facility] OR;  Service: Orthopedics;  Laterality: Left;  Needs RNFA   TOTAL HIP REVISION Right 05/10/2023   Procedure: RIGHT TOTAL HIP REVISION, ANTERIOR APPROACH;  Surgeon: Tarry Kos, MD;  Location: [facility] OR;  Service: Orthopedics;  Laterality: Right;  3-C   TOTAL KNEE ARTHROPLASTY Right  09/12/2012   redo from 2002 sugery   TOTAL KNEE REVISION Right 09/13/2012   Procedure: TOTAL KNEE REVISION;  Surgeon: Valeria Batman, MD;  Location: [facility] OR;  Service: Orthopedics;  Laterality: Right;  Revision Arthroplasty Right Knee   Social History   Occupational History   Not on file  Tobacco Use   Smoking status: Former    Current packs/day: 0.00    Types: Cigarettes    Quit date: 07/05/1992    Years since quitting: 30.9   Smokeless tobacco: Never  Vaping Use   Vaping status: Never Used  Substance and Sexual Activity   Alcohol use: No    Comment: recovering addict   Drug use: No    Comment: clean since 03/13/1984   Sexual activity: Not on file

## 2023-06-07 ENCOUNTER — Telehealth: Payer: Self-pay | Admitting: Orthopaedic Surgery

## 2023-06-07 NOTE — Telephone Encounter (Signed)
 Roy [name] called from [facility] and said that he will be moving him to home health and wanted to know if Roy [name] will sign for the orders. CB#[phone]

## 2023-06-07 NOTE — Telephone Encounter (Signed)
 Calvin called from [facility] wanted to know if Christiane Cowing will sign off orders for therapy. CB#[phone]

## 2023-06-07 NOTE — Telephone Encounter (Signed)
 Called Roy [name]. Message was not clear. No answer. Ask him to call me back.

## 2023-06-22 ENCOUNTER — Other Ambulatory Visit (INDEPENDENT_AMBULATORY_CARE_PROVIDER_SITE_OTHER): Payer: Self-pay

## 2023-06-22 ENCOUNTER — Ambulatory Visit (INDEPENDENT_AMBULATORY_CARE_PROVIDER_SITE_OTHER): Admitting: Orthopaedic Surgery

## 2023-06-22 DIAGNOSIS — Z96641 Presence of right artificial hip joint: Secondary | ICD-10-CM

## 2023-06-22 NOTE — Progress Notes (Signed)
 Post-Op Visit Note   Patient: Roy [name]           Date of Birth: Dec 10, 1955           MRN: [id number] Visit Date: 06/22/2023 PCP: Tura Gaines, MD   Assessment & Plan:  Chief Complaint:  Chief Complaint  Patient presents with   Right Hip - Routine Post Op   Visit Diagnoses:  1. S/p revision of right total hip     Plan:   Discussed the use of AI scribe software for clinical note transcription with the patient, who gave verbal consent to proceed.  History of Present Illness   Roy [name] is a 68 year old male who presents for follow-up after total hip revision for poly wear.    He spent approximately three weeks in a rehabilitation facility until mid-April and has since returned home. He currently receives physical therapy once a week and can walk and shower independently without issues. His stamina is still recovering, as evidenced by fatigue after standing for over an hour. He experienced a norovirus infection after returning home, resulting in a ten-pound weight loss.     Physical Exam   Fully healed surgical scar of the right hip.  No signs of infection.     Assessment and Plan    Patient is 6 weeks status post total hip revision including head and liner exchange for poly wear Recovery progressing well. X-rays confirm proper healing. Stamina low but expected to improve. - Continue physical therapy once a week. - Encourage walking and independent activities as tolerated. - Schedule follow-up appointment in six weeks.      Follow-Up Instructions: Return in about 6 weeks (around 08/03/2023) for lindsey.   Orders:  Orders Placed This Encounter  Procedures   XR Pelvis 1-2 Views   No orders of the defined types were placed in this encounter.   Imaging: XR Pelvis 1-2 Views Result Date: 06/22/2023 Xrays show a stable total hip replacement without complications.  Femoral head is concentric within the acetabulum component.     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   Weakness of left quadriceps muscle 07/26/2022   OA (osteoarthritis) of hip 03/11/2022   Hx of total hip arthroplasty, left 03/11/2022   History of total hip arthroplasty, right 02/06/2022   Elbow fracture, left 11/24/2019   Olecranon fracture, left, closed, initial encounter 11/21/2019   Leg laceration, left, initial encounter 11/21/2019   Osteomyelitis of knee region [facility]) 08/03/2012   Psoriatic arthritis, 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  Past Surgical History:  Procedure Laterality Date   EXCISIONAL TOTAL KNEE ARTHROPLASTY WITH ANTIBIOTIC SPACERS Right 07/07/2012   Procedure: EXCISIONAL TOTAL KNEE ARTHROPLASTY WITH ANTIBIOTIC SPACERS;  Surgeon: Shirlee Dotter, MD;  Location: [facility] OR;  Service: Orthopedics;  Laterality: Right;  Irrigation and Debridement Right Total Knee Replacement, Insertion of ABX Spacer   KNEE ARTHROSCOPY Right 07/04/2012   Procedure: ARTHROSCOPY I&D KNEE;  Surgeon: Timothy Ford, MD;  Location: [facility] OR;  Service: Orthopedics;  Laterality: Right;   ORIF ELBOW FRACTURE Left 11/24/2019   Procedure: BIOPSY, OPEN REDUCTION INTERNAL FIXATION (ORIF) LEFT ELBOW/OLECRANON FRACTURE;  Surgeon: Adah Acron, MD;  Location: [facility] OR;  Service: Orthopedics;  Laterality: Left;   ORIF TIBIA FRACTURE  2008   TOTAL HIP ARTHROPLASTY Right 2001   TOTAL HIP ARTHROPLASTY Left 03/11/2022   Procedure: LEFT TOTAL HIP ARTHROPLASTY;  Surgeon: Adah Acron, MD;  Location: [facility] OR;  Service: Orthopedics;  Laterality: Left;  Needs RNFA   TOTAL HIP REVISION Right 05/10/2023    Procedure: RIGHT TOTAL HIP REVISION, ANTERIOR APPROACH;  Surgeon: Wes Hamman, MD;  Location: [facility] OR;  Service: Orthopedics;  Laterality: Right;  3-C   TOTAL KNEE ARTHROPLASTY Right 09/12/2012   redo from 2002 sugery   TOTAL KNEE REVISION Right 09/13/2012   Procedure: TOTAL KNEE REVISION;  Surgeon: Shirlee Dotter, MD;  Location: [facility] OR;  Service: Orthopedics;  Laterality: Right;  Revision Arthroplasty Right Knee   Social History   Occupational History   Not on file  Tobacco Use   Smoking status: Former    Current packs/day: 0.00    Types: Cigarettes    Quit date: 07/05/1992    Years since quitting: 30.9   Smokeless tobacco: Never  Vaping Use   Vaping status: Never Used  Substance and Sexual Activity   Alcohol use: No    Comment: recovering addict   Drug use: No    Comment: clean since 03/13/1984   Sexual activity: Not on file

## 2023-08-03 ENCOUNTER — Ambulatory Visit (INDEPENDENT_AMBULATORY_CARE_PROVIDER_SITE_OTHER): Admitting: Physician Assistant

## 2023-08-03 DIAGNOSIS — Z96641 Presence of right artificial hip joint: Secondary | ICD-10-CM

## 2023-08-03 NOTE — Progress Notes (Signed)
 Post-Op Visit Note   Patient: Roy [name]           Date of Birth: [birth year]           MRN: [id number] Visit Date: 08/03/2023 PCP: Tura Gaines, MD   Assessment & Plan:  Chief Complaint:  Chief Complaint  Patient presents with   Right Hip - Follow-up    05/10/23 right THA   Visit Diagnoses:  1. History of revision of total replacement of right hip joint     Plan: Patient is a pleasant 68 year old gentleman who comes in today 12 weeks status post right hip revision polyliner and bone grafting, date of surgery 05/10/2023.  He has been doing much better.  He is at home.  He ambulates both with and without the walker.  He is taking Robaxin  and Tylenol  3 for pain.  Examination of his right hip: He is able to lift the left hip.  He is neurovascular intact distally.  At this point, he will continue to advance with activity as tolerated.  Continue to work on endurance.  Dental prophylaxis reinforced.  Follow-up in 3 months for repeat evaluation and x-rays of the right hip.  Call with concerns or questions.  Follow-Up Instructions: Return in about 3 months (around 11/03/2023).   Orders:  No orders of the defined types were placed in this encounter.  No orders of the defined types were placed in this encounter.   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   Weakness of left quadriceps muscle 07/26/2022   OA (osteoarthritis) of hip 03/11/2022   Hx of total hip arthroplasty, left 03/11/2022   History of total hip arthroplasty, right 02/06/2022   Elbow fracture, left 11/24/2019   Olecranon fracture, left, closed, initial encounter 11/21/2019   Leg laceration, left, initial encounter 11/21/2019   Osteomyelitis of knee region (HCC) 08/03/2012   Psoriatic arthritis, 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  Past Surgical History:  Procedure Laterality Date   EXCISIONAL TOTAL KNEE ARTHROPLASTY WITH ANTIBIOTIC SPACERS Right 07/07/2012   Procedure: EXCISIONAL TOTAL KNEE ARTHROPLASTY WITH ANTIBIOTIC SPACERS;  Surgeon: Shirlee Dotter, MD;  Location: [facility] OR;  Service: Orthopedics;  Laterality: Right;  Irrigation and Debridement Right Total Knee Replacement, Insertion of ABX Spacer   KNEE ARTHROSCOPY Right 07/04/2012   Procedure: ARTHROSCOPY I&D KNEE;  Surgeon: Timothy Ford, MD;  Location: [facility] OR;  Service: Orthopedics;  Laterality: Right;   ORIF ELBOW FRACTURE Left 11/24/2019   Procedure: BIOPSY, OPEN REDUCTION INTERNAL FIXATION (ORIF) LEFT ELBOW/OLECRANON FRACTURE;  Surgeon: Adah Acron, MD;  Location: [facility] OR;  Service: Orthopedics;  Laterality: Left;   ORIF TIBIA FRACTURE  2008   TOTAL HIP ARTHROPLASTY Right 2001   TOTAL HIP ARTHROPLASTY Left 03/11/2022   Procedure: LEFT TOTAL HIP ARTHROPLASTY;  Surgeon: Adah Acron, MD;  Location: [facility] OR;  Service: Orthopedics;  Laterality: Left;  Needs RNFA   TOTAL HIP REVISION Right 05/10/2023   Procedure: RIGHT TOTAL HIP REVISION,  ANTERIOR APPROACH;  Surgeon: Wes Hamman, MD;  Location: [facility] OR;  Service: Orthopedics;  Laterality: Right;  3-C   TOTAL KNEE ARTHROPLASTY Right 09/12/2012   redo from 2002 sugery   TOTAL KNEE REVISION Right 09/13/2012   Procedure: TOTAL KNEE REVISION;  Surgeon: Shirlee Dotter, MD;  Location: [facility] OR;  Service: Orthopedics;  Laterality: Right;  Revision Arthroplasty Right Knee   Social History   Occupational History   Not on file  Tobacco Use   Smoking status: Former    Current packs/day: 0.00    Types: Cigarettes    Quit date: 07/05/1992    Years since quitting:  31.0   Smokeless tobacco: Never  Vaping Use   Vaping status: Never Used  Substance and Sexual Activity   Alcohol use: No    Comment: recovering addict   Drug use: No    Comment: clean since 03/13/1984   Sexual activity: Not on file

## 2023-08-11 ENCOUNTER — Other Ambulatory Visit: Payer: Self-pay | Admitting: Physician Assistant

## 2023-08-11 ENCOUNTER — Encounter: Payer: Self-pay | Admitting: Orthopaedic Surgery

## 2023-08-11 MED ORDER — ACETAMINOPHEN-CODEINE 300-30 MG PO TABS: 1.0000 | ORAL_TABLET | Freq: Two times a day (BID) | ORAL | 0 refills | Status: DC | PRN

## 2023-08-11 MED ORDER — METHOCARBAMOL 500 MG PO TABS: 500.0000 mg | ORAL_TABLET | Freq: Two times a day (BID) | ORAL | 1 refills | Status: DC | PRN

## 2023-09-17 ENCOUNTER — Ambulatory Visit: Payer: Self-pay | Admitting: Podiatry

## 2023-09-17 DIAGNOSIS — B351 Tinea unguium: Secondary | ICD-10-CM

## 2023-09-17 DIAGNOSIS — M79674 Pain in right toe(s): Secondary | ICD-10-CM

## 2023-09-17 DIAGNOSIS — M79675 Pain in left toe(s): Secondary | ICD-10-CM

## 2023-09-17 NOTE — Progress Notes (Signed)
  Subjective:  Patient ID: Carlin LITTIE Kawasaki, male    DOB: [birth year],  MRN: [id number]  Chief Complaint  Patient presents with   Nail Problem    Nail trim    68 y.o. male returns for the above complaint.  Patient presents with thickened onychodystrophy mycotic toenails x 10 mild pain on palpation worse with ambulation shoe pressure patient would like something to be done unable to do it himself denies any other acute complaints.  Objective:  There were no vitals filed for this visit. Podiatric Exam: Vascular: dorsalis pedis and posterior tibial pulses are palpable bilateral. Capillary return is immediate. Temperature gradient is WNL. Skin turgor WNL  Sensorium: Normal Semmes Weinstein monofilament test. Normal tactile sensation bilaterally. Nail Exam: Pt has thick disfigured discolored nails with subungual debris noted bilateral entire nail hallux through fifth toenails.  Pain on palpation to the nails. Ulcer Exam: There is no evidence of ulcer or pre-ulcerative changes or infection. Orthopedic Exam: Muscle tone and strength are WNL. No limitations in general ROM. No crepitus or effusions noted.  Skin: No Porokeratosis. No infection or ulcers    Assessment & Plan:  No diagnosis found.  Patient was evaluated and treated and all questions answered.  Onychomycosis with pain  -Nails palliatively debrided as below. -Educated on self-care  Procedure: Nail Debridement Rationale: pain  Type of Debridement: manual, sharp debridement. Instrumentation: Nail nipper, rotary burr. Number of Nails: 10  Procedures and Treatment: Consent by patient was obtained for treatment procedures. The patient understood the discussion of treatment and procedures well. All questions were answered thoroughly reviewed. Debridement of mycotic and hypertrophic toenails, 1 through 5 bilateral and clearing of subungual debris. No ulceration, no infection noted.  Return Visit-Office Procedure: Patient instructed  to return to the office for a follow up visit 3 months for continued evaluation and treatment.  Franky Blanch, DPM    Return in about 3 months (around 12/18/2023) for RFC.

## 2023-11-09 ENCOUNTER — Ambulatory Visit: Admitting: Orthopaedic Surgery

## 2023-11-16 ENCOUNTER — Ambulatory Visit (INDEPENDENT_AMBULATORY_CARE_PROVIDER_SITE_OTHER): Admitting: Orthopaedic Surgery

## 2023-11-16 ENCOUNTER — Other Ambulatory Visit (INDEPENDENT_AMBULATORY_CARE_PROVIDER_SITE_OTHER): Payer: Self-pay

## 2023-11-16 DIAGNOSIS — Z96641 Presence of right artificial hip joint: Secondary | ICD-10-CM

## 2023-11-16 NOTE — Progress Notes (Signed)
 Post-Op Visit Note   Patient: Roy [name]           Date of Birth: Jul 08, 1955           MRN: [id number] Visit Date: 11/16/2023 PCP: Tanda Prentice DEL, MD   Assessment & Plan:  Chief Complaint:  Chief Complaint  Patient presents with   Right Hip - Follow-up    Right THA 05/10/2023   Visit Diagnoses:  1. S/p revision of right total hip     Plan: History of Present Illness ARVIE BARTHOLOMEW is a 68 year old male who presents for follow-up after right hip replacement surgery.  He is six months post head and liner swap surgery for hip replacement with no pain. Both legs are now of equal length, and he is adjusting to walking with equal leg lengths. He primarily uses a mobility aid for longer distances, such as walking to appointments or events, and occasionally in the garage due to fatigue. Stamina remains a significant issue post-surgery. He occasionally takes muscle relaxers and Tylenol  3, especially after prolonged activity, cutting the tablets in half as needed.  Physical Exam MUSCULOSKELETAL: Hip replacement site normal. SKIN: Surgical scar fully healed, looks good.  Assessment and Plan Status post right hip revision with head and liner exchange for poly wear Six months post head and liner swap. No pain, scar healed, improved strength, equal leg length. Stamina issues with fatigue after prolonged activity. Occasionally uses cane, muscle relaxers, and Tylenol  3 for soreness. - Discontinue regular follow-ups unless issues arise. - Advise to contact office for medication refills as needed.  Follow-Up Instructions: Return if symptoms worsen or fail to improve.   Orders:  Orders Placed This Encounter  Procedures   XR HIP UNILAT W OR W/O PELVIS 2-3 VIEWS RIGHT   No orders of the defined types were placed in this encounter.   Imaging: XR HIP UNILAT W OR W/O PELVIS 2-3 VIEWS RIGHT Result Date: 11/16/2023 Stable right total hip replacement without complication.  Stable  stress shielding of proximal femur and acetabulum   PMFS History: Patient Active Problem List   Diagnosis Date Noted   History of revision of total replacement of right hip joint 05/10/2023   Failure of total hip arthroplasty 12/25/2022   Weakness of left quadriceps muscle 07/26/2022   OA (osteoarthritis) of hip 03/11/2022   Hx of total hip arthroplasty, left 03/11/2022   History of total hip arthroplasty, right 02/06/2022   Elbow fracture, left 11/24/2019   Olecranon fracture, left, closed, initial encounter 11/21/2019   Leg laceration, left, initial encounter 11/21/2019   Osteomyelitis of knee region [facility]) 08/03/2012   Psoriatic arthritis, 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  Past Surgical History:  Procedure Laterality Date   EXCISIONAL TOTAL KNEE ARTHROPLASTY WITH ANTIBIOTIC SPACERS Right 07/07/2012   Procedure: EXCISIONAL TOTAL KNEE ARTHROPLASTY WITH ANTIBIOTIC SPACERS;  Surgeon: Maude LELON Right, MD;  Location: [facility] OR;  Service: Orthopedics;  Laterality: Right;  Irrigation and Debridement Right Total Knee Replacement, Insertion of ABX Spacer   KNEE ARTHROSCOPY Right 07/04/2012   Procedure: ARTHROSCOPY I&D KNEE;  Surgeon: Jerona LULLA Sage, MD;  Location: [facility] OR;  Service: Orthopedics;  Laterality: Right;   ORIF ELBOW FRACTURE Left 11/24/2019   Procedure: BIOPSY, OPEN REDUCTION INTERNAL FIXATION (ORIF) LEFT ELBOW/OLECRANON FRACTURE;  Surgeon: Barbarann Oneil BROCKS, MD;  Location: [facility] OR;  Service: Orthopedics;  Laterality: Left;   ORIF TIBIA FRACTURE  2008   TOTAL HIP ARTHROPLASTY Right 2001   TOTAL HIP ARTHROPLASTY Left 03/11/2022   Procedure: LEFT TOTAL HIP ARTHROPLASTY;  Surgeon: Barbarann Oneil BROCKS, MD;  Location: [facility] OR;  Service:  Orthopedics;  Laterality: Left;  Needs RNFA   TOTAL HIP REVISION Right 05/10/2023   Procedure: RIGHT TOTAL HIP REVISION, ANTERIOR APPROACH;  Surgeon: Jerri Kay HERO, MD;  Location: [facility] OR;  Service: Orthopedics;  Laterality: Right;  3-C   TOTAL KNEE ARTHROPLASTY Right 09/12/2012   redo from 2002 sugery   TOTAL KNEE REVISION Right 09/13/2012   Procedure: TOTAL KNEE REVISION;  Surgeon: Maude LELON Right, MD;  Location: [facility] OR;  Service: Orthopedics;  Laterality: Right;  Revision Arthroplasty Right Knee   Social History   Occupational History   Not on file  Tobacco Use   Smoking status: Former    Current packs/day: 0.00    Types: Cigarettes    Quit date: 07/05/1992    Years since quitting: 31.3   Smokeless tobacco: Never  Vaping Use   Vaping status: Never Used  Substance and Sexual Activity   Alcohol use: No    Comment: recovering addict   Drug use: No    Comment: clean since 03/13/1984   Sexual activity: Not on file

## 2023-12-24 ENCOUNTER — Ambulatory Visit (INDEPENDENT_AMBULATORY_CARE_PROVIDER_SITE_OTHER): Admitting: Podiatry

## 2023-12-24 ENCOUNTER — Encounter: Payer: Self-pay | Admitting: Podiatry

## 2023-12-24 DIAGNOSIS — M79674 Pain in right toe(s): Secondary | ICD-10-CM

## 2023-12-24 DIAGNOSIS — B351 Tinea unguium: Secondary | ICD-10-CM | POA: Diagnosis not present

## 2023-12-24 DIAGNOSIS — M79675 Pain in left toe(s): Secondary | ICD-10-CM

## 2023-12-24 NOTE — Progress Notes (Signed)
 This patient presents to the office with chief complaint of long thick painful nails.  Patient says the nails are painful walking and wearing shoes.  This patient is unable to self treat.  This patient is unable to trim hisnails since she is unable to reach his nails.  he presents to the office for preventative foot care services.  General Appearance  Alert, conversant and in no acute stress.  Vascular  Dorsalis pedis and posterior tibial  pulses are palpable  bilaterally.  Capillary return is within normal limits  bilaterally. Temperature is within normal limits  bilaterally.  Neurologic  Senn-Weinstein monofilament wire test within normal limits  bilaterally. Muscle power within normal limits bilaterally.  Nails Thick disfigured discolored nails with subungual debris  from hallux to fifth toes bilaterally. No evidence of bacterial infection or drainage bilaterally.  Orthopedic  No limitations of motion  feet .  No crepitus or effusions noted.  No bony pathology or digital deformities noted.  Skin  normotropic skin with no porokeratosis noted bilaterally.  No signs of infections or ulcers noted.     Onychomycosis  Nails  B/L.  Pain in right toes  Pain in left toes  Debridement of nails both feet followed trimming the nails with dremel tool.    RTC 3 months.   Helane Gunther DPM

## 2023-12-27 ENCOUNTER — Encounter: Payer: Self-pay | Admitting: Radiology

## 2023-12-27 ENCOUNTER — Other Ambulatory Visit: Payer: Self-pay | Admitting: Physician Assistant

## 2024-01-14 ENCOUNTER — Ambulatory Visit: Admitting: Podiatry

## 2024-01-30 ENCOUNTER — Emergency Department (HOSPITAL_COMMUNITY)

## 2024-01-30 ENCOUNTER — Emergency Department (HOSPITAL_COMMUNITY)
Admission: EM | Admit: 2024-01-30 | Discharge: 2024-01-30 | Disposition: A | Discharge status: Home / Self Care | Attending: Emergency Medicine | Admitting: Emergency Medicine

## 2024-01-30 DIAGNOSIS — W19XXXA Unspecified fall, initial encounter: Secondary | ICD-10-CM

## 2024-01-30 DIAGNOSIS — S0181XA Laceration without foreign body of other part of head, initial encounter: Secondary | ICD-10-CM

## 2024-01-30 MED ORDER — LIDOCAINE-EPINEPHRINE (PF) 2 %-1:200000 IJ SOLN
10.0000 mL | Freq: Once | INTRAMUSCULAR | Status: AC
  Administered 2024-01-30: 10 mL
  Filled 2024-01-30: qty 20

## 2024-01-30 MED ORDER — ACETAMINOPHEN-CODEINE 300-30 MG PO TABS
1.0000 | ORAL_TABLET | Freq: Once | ORAL | Status: AC
  Administered 2024-01-30: 1 via ORAL
  Filled 2024-01-30: qty 1

## 2024-01-30 NOTE — Discharge Instructions (Signed)
 As we discussed, your workup in the ER today was reassuring for acute findings.  CT imaging of your head neck and face did not reveal any emergent concerns.  We have closed your laceration(s) with sutures. These need to be removed in 7-10 days. This can be done at any doctor's office, urgent care, or emergency department.   If any of the sutures come out before it is time for removal, that is okay. Make sure to keep the area as clean and dry as possible. You can let warm soapy water  run over the area, but do NOT scrub it.   Watch out for signs of infection, like we discussed, including: increased redness, tenderness, or drainage of pus from the area. If this happens and you have not been prescribed an antibiotic, please seek medical attention for possible infection.   You can take over the counter pain medicine like ibuprofen  or tylenol  as needed.   Return if development of any new or worsening symptoms

## 2024-01-30 NOTE — ED Provider Notes (Signed)
 [place] [facility]PARTMENT AT [facility] Provider Note   CSN: [id number] Arrival date & time: 01/30/24  1805     Patient presents with: Roy [name] Darroll is a 68 y.o. male.   Patient with history of psoriatic arthritis presents today with complaints of mechanical fall.  Reports that same occurred prior to arrival today when he lost his balance and fell forward and struck his left face on an end table.  He did not lose consciousness.  He is not anticoagulated.  He has been able to stand since the fall without issue. Reports laceration to the left face. Denies any other injuries or complaints.  His Tdap is up-to-date.  The history is provided by the patient. No language interpreter was used.  Fall Associated symptoms include headaches.       Prior to Admission medications   Medication Sig Start Date End Date Taking? Authorizing Provider  acetaminophen -codeine  (TYLENOL  #3) 300-30 MG tablet TAKE 1 TABLET BY MOUTH TWICE A DAY AS NEEDED 12/28/23   Jule Ronal LITTIE, PA-C  aspirin  (ASPIRIN  81) 81 MG chewable tablet Chew 1 tablet (81 mg total) by mouth 2 (two) times daily. To be taken after surgery to prevent blood clots 05/11/23   Jule Ronal LITTIE, PA-C  cetirizine (ZYRTEC) 10 MG tablet Take 10 mg by mouth at bedtime.    [provider]  chlorhexidine  (HIBICLENS ) 4 % external liquid Apply 15 mLs (1 Application total) topically as directed for 30 doses. Use as directed daily for 5 days every other week for 6 weeks. 05/10/23   Jerri Kay HERO, MD  docusate sodium  (COLACE) 100 MG capsule Take 1 capsule (100 mg total) by mouth daily as needed. 04/28/23 04/27/24  Jule Ronal LITTIE, PA-C  doxycycline  (VIBRA -TABS) 100 MG tablet Take 1 tablet (100 mg total) by mouth 2 (two) times daily. 05/11/23   Jule Ronal LITTIE, PA-C  fluticasone  (FLONASE ) 50 MCG/ACT nasal spray Place 1-2 sprays into both nostrils at bedtime as needed for allergies or rhinitis.    [provider]   gabapentin  (NEURONTIN ) 300 MG capsule Take 300 mg by mouth at bedtime. 11/05/22   [provider]  HYDROcodone -acetaminophen  (NORCO) 7.5-325 MG tablet Take 1-2 tablets by mouth every 6 (six) hours as needed for moderate pain (pain score 4-6). To be taken after surgery 05/11/23   Jule Ronal LITTIE, PA-C  hydrOXYzine  (ATARAX ) 25 MG tablet Take 1 tablet (25 mg total) by mouth every 8 (eight) hours as needed. Patient taking differently: Take 25 mg by mouth at bedtime. 08/01/22   Gladis, Mary-Margaret, FNP  melatonin 5 MG TABS Take 20 mg by mouth at bedtime.    [provider]  methocarbamol  (ROBAXIN ) 500 MG tablet TAKE 1 TABLET BY MOUTH TWICE A DAY AS NEEDED 12/28/23   Jule Ronal LITTIE, PA-C  methocarbamol  (ROBAXIN -750) 750 MG tablet Take 1 tablet (750 mg total) by mouth 3 (three) times daily as needed for muscle spasms. To be taken after surgery 04/28/23   Jule Ronal LITTIE, PA-C  omeprazole (PRILOSEC) 40 MG capsule Take 40 mg by mouth at bedtime. 03/08/23   [provider]  ondansetron  (ZOFRAN ) 4 MG tablet Take 1 tablet (4 mg total) by mouth every 8 (eight) hours as needed for nausea or vomiting. 04/28/23   Jule Ronal LITTIE, PA-C  triamcinolone  ointment (KENALOG ) 0.1 % Apply 1 Application topically 2 (two) times daily.    [provider]    Allergies: Demerol  [meperidine ], Morphine  and  codeine , and Oxycodone     Review of Systems  Skin:  Positive for wound.  Neurological:  Positive for headaches.  All other systems reviewed and are negative.   Updated Vital Signs BP (!) 147/73 (BP Location: Right Arm)   Pulse 65   Temp 97.7 F (36.5 C) (Oral)   Resp 16   Ht 5' 6 (1.676 m)   Wt 104.3 kg   SpO2 99%   BMI 37.12 kg/m   Physical Exam Vitals and nursing note reviewed.  Constitutional:      General: He is not in acute distress.    Appearance: Normal appearance. He is normal weight. He is not ill-appearing, toxic-appearing or diaphoretic.  HENT:     Head:  Normocephalic and atraumatic.     Comments: No racoon eyes No battle sign  3 cm linear laceration noted to the left upper lip/maxilla area. No active bleeding.  Does not cross the vermilion border. No signs of foreign body, facial muscles intact.  Superficial skin abrasion with superficial skin flap noted to the left cheek just inferior to the zygomatic arch. No bleeding. No blood flow to skin flap.      Mouth/Throat:     Comments: No signs of intraoral trauma, no wounds or loose teeth.  No trismus  Eyes:     Extraocular Movements: Extraocular movements intact.     Pupils: Pupils are equal, round, and reactive to light.  Cardiovascular:     Rate and Rhythm: Normal rate.     Comments: No tenderness to palpation of the anterior chest wall Pulmonary:     Effort: Pulmonary effort is normal. No respiratory distress.  Abdominal:     Comments: No abdominal tenderness or bruising  Musculoskeletal:        General: Normal range of motion.     Cervical back: Normal and normal range of motion.     Thoracic back: Normal.     Lumbar back: Normal.     Comments: No midline tenderness, no stepoffs or deformity noted on palpation of cervical, thoracic, and lumbar spine  Skin:    General: Skin is warm and dry.  Neurological:     General: No focal deficit present.     Mental Status: He is alert and oriented to person, place, and time.  Psychiatric:        Mood and Affect: Mood normal.        Behavior: Behavior normal.     (all labs ordered are listed, but only abnormal results are displayed) Labs Reviewed - No data to display  EKG: None  Radiology: CT Head Wo Contrast Result Date: 01/30/2024 EXAM: CT HEAD, FACIAL BONES AND CERVICAL SPINE WITHOUT CONTRAST 01/30/2024 07:05:40 PM TECHNIQUE: CT of the head, facial bones and cervical spine was performed without the administration of intravenous contrast. Multiplanar reformatted images are provided for review. Automated exposure control,  iterative reconstruction, and/or weight based adjustment of the mA/kV was utilized to reduce the radiation dose to as low as reasonably achievable. COMPARISON: None available. CLINICAL HISTORY: Head trauma, minor (Age >= 65y) Head trauma, minor (Age >= 65y) FINDINGS: CT HEAD BRAIN AND VENTRICLES: No acute intracranial hemorrhage. No mass effect or midline shift. No extra-axial fluid collection. No evidence of acute infarct. No hydrocephalus. SKULL AND SCALP: No acute skull fracture. No scalp hematoma. CT FACIAL BONES FACIAL BONES: No acute facial fracture. No mandibular dislocation. Left greater than right TMJ degenerative change. ORBITS: No acute traumatic injury. SINUSES AND MASTOIDS: No acute abnormality.  SOFT TISSUES: Left cheek contusion. CT CERVICAL SPINE BONES AND ALIGNMENT: No acute fracture or traumatic malalignment. Bony fusion at multiple levels. DEGENERATIVE CHANGES: Moderate multilevel degenerative change. This included craniocervical degenerative changes and facet/uncovertebral hypertrophy with varying degrees of neural foraminal stenosis. SOFT TISSUES: No prevertebral soft tissue swelling. IMPRESSION: 1. No acute intracranial abnormality. 2. No acute fracture or traumatic malalignment of the cervical spine. 3. No acute fracture of the facial bones. 4. Left cheek/face contusion. Electronically signed by: Gilmore Molt MD 01/30/2024 07:24 PM EST RP Workstation: [id number]   CT Maxillofacial Wo Contrast Result Date: 01/30/2024 EXAM: CT HEAD, FACIAL BONES AND CERVICAL SPINE WITHOUT CONTRAST 01/30/2024 07:05:40 PM TECHNIQUE: CT of the head, facial bones and cervical spine was performed without the administration of intravenous contrast. Multiplanar reformatted images are provided for review. Automated exposure control, iterative reconstruction, and/or weight based adjustment of the mA/kV was utilized to reduce the radiation dose to as low as reasonably achievable. COMPARISON: None available. CLINICAL  HISTORY: Head trauma, minor (Age >= 65y) Head trauma, minor (Age >= 65y) FINDINGS: CT HEAD BRAIN AND VENTRICLES: No acute intracranial hemorrhage. No mass effect or midline shift. No extra-axial fluid collection. No evidence of acute infarct. No hydrocephalus. SKULL AND SCALP: No acute skull fracture. No scalp hematoma. CT FACIAL BONES FACIAL BONES: No acute facial fracture. No mandibular dislocation. Left greater than right TMJ degenerative change. ORBITS: No acute traumatic injury. SINUSES AND MASTOIDS: No acute abnormality. SOFT TISSUES: Left cheek contusion. CT CERVICAL SPINE BONES AND ALIGNMENT: No acute fracture or traumatic malalignment. Bony fusion at multiple levels. DEGENERATIVE CHANGES: Moderate multilevel degenerative change. This included craniocervical degenerative changes and facet/uncovertebral hypertrophy with varying degrees of neural foraminal stenosis. SOFT TISSUES: No prevertebral soft tissue swelling. IMPRESSION: 1. No acute intracranial abnormality. 2. No acute fracture or traumatic malalignment of the cervical spine. 3. No acute fracture of the facial bones. 4. Left cheek/face contusion. Electronically signed by: Gilmore Molt MD 01/30/2024 07:24 PM EST RP Workstation: [id number]   CT Cervical Spine Wo Contrast Result Date: 01/30/2024 EXAM: CT HEAD, FACIAL BONES AND CERVICAL SPINE WITHOUT CONTRAST 01/30/2024 07:05:40 PM TECHNIQUE: CT of the head, facial bones and cervical spine was performed without the administration of intravenous contrast. Multiplanar reformatted images are provided for review. Automated exposure control, iterative reconstruction, and/or weight based adjustment of the mA/kV was utilized to reduce the radiation dose to as low as reasonably achievable. COMPARISON: None available. CLINICAL HISTORY: Head trauma, minor (Age >= 65y) Head trauma, minor (Age >= 65y) FINDINGS: CT HEAD BRAIN AND VENTRICLES: No acute intracranial hemorrhage. No mass effect or midline shift. No  extra-axial fluid collection. No evidence of acute infarct. No hydrocephalus. SKULL AND SCALP: No acute skull fracture. No scalp hematoma. CT FACIAL BONES FACIAL BONES: No acute facial fracture. No mandibular dislocation. Left greater than right TMJ degenerative change. ORBITS: No acute traumatic injury. SINUSES AND MASTOIDS: No acute abnormality. SOFT TISSUES: Left cheek contusion. CT CERVICAL SPINE BONES AND ALIGNMENT: No acute fracture or traumatic malalignment. Bony fusion at multiple levels. DEGENERATIVE CHANGES: Moderate multilevel degenerative change. This included craniocervical degenerative changes and facet/uncovertebral hypertrophy with varying degrees of neural foraminal stenosis. SOFT TISSUES: No prevertebral soft tissue swelling. IMPRESSION: 1. No acute intracranial abnormality. 2. No acute fracture or traumatic malalignment of the cervical spine. 3. No acute fracture of the facial bones. 4. Left cheek/face contusion. Electronically signed by: Gilmore Molt MD 01/30/2024 07:24 PM EST RP Workstation: [id number]     .Laceration Repair  Date/Time: 01/30/2024  8:51 PM  Performed by: Nora Lauraine LABOR, PA-C Authorized by: Nora Lauraine LABOR, PA-C   Consent:    Consent obtained:  Verbal   Consent given by:  Patient   Risks, benefits, and alternatives were discussed: yes     Risks discussed:  Infection, pain, retained foreign body, tendon damage, vascular damage, poor wound healing, poor cosmetic result, need for additional repair and nerve damage   Alternatives discussed:  No treatment, delayed treatment, observation and referral Universal protocol:    Imaging studies available: yes     Patient identity confirmed:  Verbally with patient Anesthesia:    Anesthesia method:  Local infiltration   Local anesthetic:  Lidocaine  2% WITH epi Laceration details:    Location:  Face   Facial location: left upper lip/maxilla area. Does not cross the vermilion border.   Length (cm):  3   Depth (mm):   2 Exploration:    Imaging outcome: foreign body not noted     Wound exploration: wound explored through full range of motion and entire depth of wound visualized   Treatment:    Area cleansed with:  Soap and water    Amount of cleaning:  Standard   Irrigation solution:  Sterile saline   Irrigation volume:  500 ml   Irrigation method:  Pressure wash Skin repair:    Repair method:  Sutures   Suture size:  5-0   Suture material:  Prolene   Suture technique:  Simple interrupted   Number of sutures:  5 Approximation:    Approximation:  Close Repair type:    Repair type:  Simple Post-procedure details:    Dressing:  Antibiotic ointment and non-adherent dressing   Procedure completion:  Tolerated well, no immediate complications    Medications Ordered in the ED  lidocaine -EPINEPHrine  (XYLOCAINE  W/EPI) 2 %-1:200000 (PF) injection 10 mL (10 mLs Infiltration Given by Other 01/30/24 1928)  acetaminophen -codeine  (TYLENOL  #3) 300-30 MG per tablet 1 tablet (1 tablet Oral Given 01/30/24 1924)                                    Medical Decision Making Amount and/or Complexity of Data Reviewed Radiology: ordered.  Risk Prescription drug management.   This patient is a 68 y.o. male who presents to the ED for concern of fall, face laceration  Past Medical History / Co-morbidities / Social History: Hx psoriatic arthritis  Additional history: Chart reviewed.  Physical Exam: Physical exam performed. The pertinent findings include:   3 cm linear laceration noted to the left upper lip/maxilla area. No active bleeding.  Does not cross the vermilion border. No signs of foreign body, facial muscles intact.  Superficial skin abrasion with superficial skin flap noted to the left cheek just inferior to the zygomatic arch. No bleeding. No blood flow to skin flap.   No signs of intraoral trauma, no wounds or loose teeth.  No trismus   Imaging Studies: I ordered imaging studies including CT  head, cervical spine, max/face. I independently visualized and interpreted imaging which showed   1. No acute intracranial abnormality. 2. No acute fracture or traumatic malalignment of the cervical spine. 3. No acute fracture of the facial bones. 4. Left cheek/face contusion.  I agree with the radiologist interpretation.   Medications: I ordered medication including Tylenol  #3  for pain. Reevaluation of the patient after these medicines showed that the patient improved. I have reviewed the patients  home medicines and have made adjustments as needed.   Disposition: After consideration of the diagnostic results and the patients response to treatment, I feel that emergency department workup does not suggest an emergent condition requiring admission or immediate intervention beyond what has been performed at this time. The plan is: Discharge with close outpatient follow-up and return precautions.  Patient's imaging is negative, his laceration has been repaired per above procedure which was well-tolerated. Wound was extensively cleaned, and repaired as described above.  Patient tolerated the procedure without difficulty.  Nonadherent dressing was placed.  Patient instructed to return in 7-10 days for suture removal, and monitor for signs of infection including worsening pain, redness, pus draining from the affected site.  Instructed to keep the wound clean, bandage, and change the bandage at least once daily.  Patient understands and agrees to the plan, and is discharged in stable condition at this time. Evaluation and diagnostic testing in the emergency department does not suggest an emergent condition requiring admission or immediate intervention beyond what has been performed at this time.  Plan for discharge with close PCP follow-up.  Patient is understanding and amenable with plan, educated on red flag symptoms that would prompt immediate return.  Patient discharged in stable condition.  This is a  shared visit with supervising physician Dr. Dean who has independently evaluated patient & provided guidance in evaluation/management/disposition, in agreement with care   Final diagnoses:  Fall, initial encounter  Facial laceration, initial encounter    ED Discharge Orders     None     An After Visit Summary was printed and given to the patient.      Nora Lauraine DELENA DEVONNA 01/30/24 2057    Dean Clarity, MD 01/30/24 2059

## 2024-01-30 NOTE — ED Triage Notes (Signed)
 [facility] from home s/p mechanical fall. + head strike, - loc, - thinners. A&Ox4, c/o 1/10 pain to left maxillary region, where there is a 3 cm superficial laceration.

## 2024-02-04 ENCOUNTER — Ambulatory Visit: Admitting: Podiatry

## 2024-03-03 ENCOUNTER — Ambulatory Visit: Admitting: Podiatry

## 2024-03-17 ENCOUNTER — Ambulatory Visit: Admitting: Podiatry

## 2024-03-24 ENCOUNTER — Ambulatory Visit: Admitting: Podiatry

## 2024-03-31 ENCOUNTER — Ambulatory Visit: Admitting: Podiatry

## 2024-05-05 ENCOUNTER — Ambulatory Visit: Admitting: Podiatry
